# Patient Record
Sex: Female | Born: 1965 | Race: White | Hispanic: No | Marital: Married | Smoking: Never smoker
Health system: Southern US, Community
[De-identification: ages and names within clinical notes are randomized; demographics above are authoritative.]

## PROBLEM LIST (undated history)

## (undated) DIAGNOSIS — D649 Anemia, unspecified: Secondary | ICD-10-CM

## (undated) DIAGNOSIS — R112 Nausea with vomiting, unspecified: Secondary | ICD-10-CM

## (undated) DIAGNOSIS — K219 Gastro-esophageal reflux disease without esophagitis: Secondary | ICD-10-CM

## (undated) DIAGNOSIS — G510 Bell's palsy: Secondary | ICD-10-CM

## (undated) DIAGNOSIS — F419 Anxiety disorder, unspecified: Secondary | ICD-10-CM

## (undated) DIAGNOSIS — C50919 Malignant neoplasm of unspecified site of unspecified female breast: Secondary | ICD-10-CM

## (undated) DIAGNOSIS — Z9889 Other specified postprocedural states: Secondary | ICD-10-CM

## (undated) DIAGNOSIS — M797 Fibromyalgia: Secondary | ICD-10-CM

## (undated) DIAGNOSIS — F32A Depression, unspecified: Secondary | ICD-10-CM

## (undated) DIAGNOSIS — J45909 Unspecified asthma, uncomplicated: Secondary | ICD-10-CM

## (undated) DIAGNOSIS — I10 Essential (primary) hypertension: Secondary | ICD-10-CM

## (undated) HISTORY — DX: Unspecified asthma, uncomplicated: J45.909

## (undated) HISTORY — PX: ABDOMINAL HYSTERECTOMY: SHX81

## (undated) HISTORY — PX: NASAL SEPTUM SURGERY: SHX37

## (undated) HISTORY — DX: Malignant neoplasm of unspecified site of unspecified female breast: C50.919

## (undated) HISTORY — PX: WISDOM TOOTH EXTRACTION: SHX21

## (undated) HISTORY — PX: TUBAL LIGATION: SHX77

---

## 2001-04-01 ENCOUNTER — Ambulatory Visit (HOSPITAL_COMMUNITY): Admission: RE | Admit: 2001-04-01 | Discharge: 2001-04-01 | Payer: Self-pay | Admitting: Neurology

## 2001-06-11 ENCOUNTER — Ambulatory Visit (HOSPITAL_COMMUNITY): Admission: RE | Admit: 2001-06-11 | Discharge: 2001-06-11 | Payer: Self-pay | Admitting: Family Medicine

## 2001-06-11 ENCOUNTER — Encounter: Payer: Self-pay | Admitting: Family Medicine

## 2001-06-25 ENCOUNTER — Emergency Department (HOSPITAL_COMMUNITY): Admission: EM | Admit: 2001-06-25 | Discharge: 2001-06-25 | Payer: Self-pay | Admitting: Emergency Medicine

## 2001-12-07 ENCOUNTER — Encounter: Payer: Self-pay | Admitting: Family Medicine

## 2001-12-07 ENCOUNTER — Ambulatory Visit (HOSPITAL_COMMUNITY): Admission: RE | Admit: 2001-12-07 | Discharge: 2001-12-07 | Payer: Self-pay | Admitting: Family Medicine

## 2001-12-12 ENCOUNTER — Emergency Department (HOSPITAL_COMMUNITY): Admission: EM | Admit: 2001-12-12 | Discharge: 2001-12-12 | Payer: Self-pay | Admitting: *Deleted

## 2002-01-04 ENCOUNTER — Encounter: Payer: Self-pay | Admitting: Family Medicine

## 2002-01-04 ENCOUNTER — Ambulatory Visit (HOSPITAL_COMMUNITY): Admission: RE | Admit: 2002-01-04 | Discharge: 2002-01-04 | Payer: Self-pay | Admitting: Family Medicine

## 2002-01-19 ENCOUNTER — Ambulatory Visit (HOSPITAL_COMMUNITY): Admission: RE | Admit: 2002-01-19 | Discharge: 2002-01-19 | Payer: Self-pay | Admitting: Family Medicine

## 2002-01-19 ENCOUNTER — Encounter: Payer: Self-pay | Admitting: Family Medicine

## 2003-05-19 ENCOUNTER — Other Ambulatory Visit: Admission: RE | Admit: 2003-05-19 | Discharge: 2003-05-19 | Payer: Self-pay | Admitting: Obstetrics & Gynecology

## 2003-05-24 ENCOUNTER — Emergency Department (HOSPITAL_COMMUNITY): Admission: EM | Admit: 2003-05-24 | Discharge: 2003-05-24 | Payer: Self-pay | Admitting: Emergency Medicine

## 2003-11-17 ENCOUNTER — Ambulatory Visit (HOSPITAL_COMMUNITY): Admission: RE | Admit: 2003-11-17 | Discharge: 2003-11-17 | Payer: Self-pay | Admitting: Family Medicine

## 2007-03-09 ENCOUNTER — Ambulatory Visit (HOSPITAL_COMMUNITY): Admission: RE | Admit: 2007-03-09 | Discharge: 2007-03-09 | Payer: Self-pay | Admitting: Family Medicine

## 2007-03-15 ENCOUNTER — Ambulatory Visit: Payer: Self-pay | Admitting: *Deleted

## 2007-03-17 ENCOUNTER — Ambulatory Visit (HOSPITAL_COMMUNITY): Admission: RE | Admit: 2007-03-17 | Discharge: 2007-03-17 | Payer: Self-pay | Admitting: *Deleted

## 2007-03-17 ENCOUNTER — Ambulatory Visit: Payer: Self-pay | Admitting: Cardiology

## 2011-11-06 ENCOUNTER — Encounter: Payer: Self-pay | Admitting: *Deleted

## 2011-11-06 ENCOUNTER — Emergency Department (HOSPITAL_COMMUNITY): Payer: BC Managed Care – PPO

## 2011-11-06 ENCOUNTER — Emergency Department (HOSPITAL_COMMUNITY)
Admission: EM | Admit: 2011-11-06 | Discharge: 2011-11-06 | Disposition: A | Discharge status: Home / Self Care | Payer: BC Managed Care – PPO | Attending: Emergency Medicine | Admitting: Emergency Medicine

## 2011-11-06 DIAGNOSIS — M7989 Other specified soft tissue disorders: Secondary | ICD-10-CM | POA: Insufficient documentation

## 2011-11-06 DIAGNOSIS — R079 Chest pain, unspecified: Secondary | ICD-10-CM | POA: Insufficient documentation

## 2011-11-06 DIAGNOSIS — R0781 Pleurodynia: Secondary | ICD-10-CM

## 2011-11-06 DIAGNOSIS — R109 Unspecified abdominal pain: Secondary | ICD-10-CM | POA: Insufficient documentation

## 2011-11-06 LAB — URINALYSIS, ROUTINE W REFLEX MICROSCOPIC
Bilirubin Urine: NEGATIVE
Glucose, UA: NEGATIVE mg/dL
Hgb urine dipstick: NEGATIVE
Ketones, ur: NEGATIVE mg/dL
Leukocytes, UA: NEGATIVE
Nitrite: NEGATIVE
Protein, ur: NEGATIVE mg/dL
Specific Gravity, Urine: 1.025 (ref 1.005–1.030)
Urobilinogen, UA: 0.2 mg/dL (ref 0.0–1.0)
pH: 6 (ref 5.0–8.0)

## 2011-11-06 MED ORDER — OXYCODONE-ACETAMINOPHEN 5-325 MG PO TABS: 2.0000 | ORAL_TABLET | Freq: Once | ORAL | Status: AC

## 2011-11-06 MED ORDER — OXYCODONE-ACETAMINOPHEN 5-325 MG PO TABS
2.0000 | ORAL_TABLET | Freq: Once | ORAL | Status: AC
  Administered 2011-11-06: 2 via ORAL
  Filled 2011-11-06: qty 2

## 2011-11-06 MED ORDER — IBUPROFEN 600 MG PO TABS: 600.0000 mg | ORAL_TABLET | Freq: Four times a day (QID) | ORAL | Status: AC | PRN

## 2011-11-06 NOTE — ED Provider Notes (Signed)
History     CSN: [id number] Arrival date & time: 11/06/2011  1:16 AM   First MD Initiated Contact with Patient 11/06/11 0157      Chief Complaint  Patient presents with  . Flank Pain    (Consider location/radiation/quality/duration/timing/severity/associated sxs/prior treatment) HPI Comments: 45 year old female with a history of fibromyalgia presents with a complaint of acute onset of bilateral rib pain which radiates from the anterior ribs below the breasts to the mid back. This is worse with deep breathing, worse with coughing, sharp and stabbing in nature and constant. She denies any fevers chills nausea or vomiting. She has had similar symptoms symptoms to this in the past though they were much more mild at that time. She denies any change in her dietary habits, exercise habits or medication regimen. She did take 600 mg of Advil prior to arrival with no relief. She denies injury  The history is provided by the patient and a relative.    History reviewed. No pertinent past medical history.  Past Surgical History  Procedure Date  . Tubal ligation   . Nasal septum surgery     No family history on file.  History  Substance Use Topics  . Smoking status: Never Smoker   . Smokeless tobacco: Not on file  . Alcohol Use: No    OB History    Grav Para Term Preterm Abortions TAB SAB Ect Mult Living                  Review of Systems  All other systems reviewed and are negative.    Allergies  Sulfa antibiotics  Home Medications   Current Outpatient Rx  Name Route Sig Dispense Refill  . IBUPROFEN 600 MG PO TABS Oral Take 1 tablet (600 mg total) by mouth every 6 (six) hours as needed for pain. 30 tablet 0  . OXYCODONE-ACETAMINOPHEN 5-325 MG PO TABS Oral Take 2 tablets by mouth once. 10 tablet 0    BP 151/97  Pulse 73  Temp 98.6 F (37 C)  Resp 20  Ht 5' (1.524 m)  Wt 224 lb (101.606 kg)  BMI 43.75 kg/m2  SpO2 99%  LMP 10/06/2011  Physical Exam  Nursing note  and vitals reviewed. Constitutional: She appears well-developed and well-nourished.       Uncomfortable appearing  HENT:  Head: Normocephalic and atraumatic.  Mouth/Throat: Oropharynx is clear and moist. No oropharyngeal exudate.  Eyes: Conjunctivae and EOM are normal. Pupils are equal, round, and reactive to light. Right eye exhibits no discharge. Left eye exhibits no discharge. No scleral icterus.  Neck: Normal range of motion. Neck supple. No JVD present. No thyromegaly present.  Cardiovascular: Normal rate, regular rhythm, normal heart sounds and intact distal pulses.  Exam reveals no gallop and no friction rub.   No murmur heard. Pulmonary/Chest: Effort normal and breath sounds normal. No respiratory distress. She has no wheezes. She has no rales. She exhibits tenderness ( Chaperone present for exam, patient has bilateral rib margin tenderness over the lower 2 ribs anteriorly, laterally, mildly posteriorly.).  Abdominal: Soft. Bowel sounds are normal. She exhibits no distension and no mass. There is no tenderness.  Musculoskeletal: Normal range of motion. She exhibits no edema and no tenderness.  Lymphadenopathy:    She has no cervical adenopathy.  Neurological: She is alert. Coordination normal.  Skin: Skin is warm and dry. No rash noted. No erythema.  Psychiatric: She has a normal mood and affect. Her behavior is normal.  ED Course  Procedures (including critical care time)   Labs Reviewed  URINALYSIS, ROUTINE W REFLEX MICROSCOPIC   Dg Chest 2 View  11/06/2011  *RADIOLOGY REPORT*  Clinical Data: Status post fall; bilateral anterior upper rib pain.  CHEST - 2 VIEW  Comparison: Chest radiograph performed 03/09/2007  Findings: The lungs are well-aerated and clear.  There is no evidence of focal opacification, pleural effusion or pneumothorax.  The heart is normal in size; the mediastinal contour is within normal limits.  No acute osseous abnormalities are seen.  IMPRESSION: No  acute cardiopulmonary process seen.  No displaced rib fractures identified.  Original Report Authenticated By: Tonia Ghent, M.D.     1. Rib pain       MDM  No tenderness in the abdominal wall, but has tenderness over the bilateral chest wall. No signs of rash deformity or trauma. Oxygen saturation 99% an underlying lung sounds are normal. X-ray pending, pain medications given  Pain medication with significant improvement. Again vital signs appear normal and patient is amenable to discharge. Rest x-ray reviewed and findings communicated to patient     Vida Roller, MD 11/06/11 [id number]

## 2011-11-06 NOTE — ED Notes (Signed)
Pt report bilateral flank pain starting approx 8 hs ago

## 2012-03-18 ENCOUNTER — Other Ambulatory Visit: Payer: Self-pay

## 2012-03-18 DIAGNOSIS — J45909 Unspecified asthma, uncomplicated: Secondary | ICD-10-CM

## 2012-03-24 ENCOUNTER — Ambulatory Visit (HOSPITAL_COMMUNITY): Payer: BC Managed Care – PPO

## 2012-03-29 ENCOUNTER — Encounter (HOSPITAL_COMMUNITY): Payer: BC Managed Care – PPO

## 2013-04-21 ENCOUNTER — Ambulatory Visit (INDEPENDENT_AMBULATORY_CARE_PROVIDER_SITE_OTHER): Payer: BC Managed Care – PPO | Admitting: Family Medicine

## 2013-04-21 ENCOUNTER — Encounter: Payer: Self-pay | Admitting: Family Medicine

## 2013-04-21 VITALS — BP 120/82 | Temp 99.0°F | Ht 63.0 in | Wt 252.0 lb

## 2013-04-21 DIAGNOSIS — J45901 Unspecified asthma with (acute) exacerbation: Secondary | ICD-10-CM

## 2013-04-21 MED ORDER — ALBUTEROL SULFATE HFA 108 (90 BASE) MCG/ACT IN AERS: 2.0000 | INHALATION_SPRAY | Freq: Four times a day (QID) | RESPIRATORY_TRACT | Status: DC | PRN

## 2013-04-21 MED ORDER — FLUTICASONE PROPIONATE HFA 220 MCG/ACT IN AERO: 2.0000 | INHALATION_SPRAY | Freq: Two times a day (BID) | RESPIRATORY_TRACT | Status: DC

## 2013-04-21 MED ORDER — PREDNISONE 20 MG PO TABS: ORAL_TABLET | ORAL | Status: DC

## 2013-04-21 NOTE — Progress Notes (Signed)
  Subjective:    Patient ID: Tammy Calderon, female    DOB: October 14, 1966, 47 y.o.   MRN: [id number]  Wheezing  This is a recurrent problem. The current episode started more than 1 month ago. The problem occurs constantly. The problem has been unchanged. Associated symptoms include shortness of breath. Nothing aggravates the symptoms. Treatments tried: albuterol inhaler. The treatment provided moderate relief. Her past medical history is significant for asthma.  Was treated about a year ago with inhalers it got better but now with reoccurance   No fam history of asthma/ not around smoke Review of Systems  Respiratory: Positive for shortness of breath and wheezing.   worse past few days. Worse at night.     Objective:   Physical Exam Eardrums normal throat normal neck no masses lungs bilateral expiratory wheezes not respiratory distress. hearts regular skin warm dry neurologic grossly normal Extremities no edema.      Assessment & Plan:  Asthma-long t maintenance medicine I like to see her back again in2-3 months to see how things are going. Possibly by late summer we can taper down on the Flovent.ime was spent talking to the patient educating her about asthma. Also educating about Ventolin 2 puffs every 4 hours when necessary as a rescue inhaler and Flovent 2 puffs twice a day ongoing as a. Also prednisone taper. 25 minutes spent with patient

## 2013-04-21 NOTE — Patient Instructions (Addendum)
It is important to use your albuterol inhaler 2 puffs every 4 hours as needed for wheezing. This is your rescue medicine. If you're having a difficult time he can use as often as every couple hours. Certainly if you feel you're breathing is getting worse she needs to let us know.  Flovent is a maintenance medicine. 2 puffs twice a day reduces the inflammation in your lungs which hopefully over the course of the next 2 weeks we will reduce how much you have to use the albuterol. The goal is to see a significant improvement in your breathing over the course of the next several weeks.  It is okay to use Claritin or the generic form of it as needed for allergies along with this medication. He will also be on prednisone over the course of the next 6 days 3 tablets a day for the first 3 days then 2 tablets daily for 3 days. If you feel that things are getting worse rather than better please call us. I would like to see back in approximately 2 months to recheck how this is doing at that point we may be able to reduce the Flovent. Hopefully as the summer progresses he will be able to taper off of your medication. At this present moment I would not recommend lung function testing. I also don't recommend seeing asthma specialist at this present moment. If your situation changes please let us know   .Asthma Attack Prevention HOW CAN ASTHMA BE PREVENTED? Currently, there is no way to prevent asthma from starting. However, you can take steps to control the disease and prevent its symptoms after you have been diagnosed. Learn about your asthma and how to control it. Take an active role to control your asthma by working with your caregiver to create and follow an asthma action plan. An asthma action plan guides you in taking your medicines properly, avoiding factors that make your asthma worse, tracking your level of asthma control, responding to worsening asthma, and seeking emergency care when needed. To track your  asthma, keep records of your symptoms, check your peak flow number using a peak flow meter (handheld device that shows how well air moves out of your lungs), and get regular asthma checkups.  Other ways to prevent asthma attacks include:  Use medicines as your caregiver directs.  Identify and avoid things that make your asthma worse (as much as you can).  Keep track of your asthma symptoms and level of control.  Get regular checkups for your asthma.  With your caregiver, write a detailed plan for taking medicines and managing an asthma attack. Then be sure to follow your action plan. Asthma is an ongoing condition that needs regular monitoring and treatment.  Identify and avoid asthma triggers. A number of outdoor allergens and irritants (pollen, mold, cold air, air pollution) can trigger asthma attacks. Find out what causes or makes your asthma worse, and take steps to avoid those triggers (see below).  Monitor your breathing. Learn to recognize warning signs of an attack, such as slight coughing, wheezing or shortness of breath. However, your lung function may already decrease before you notice any signs or symptoms, so regularly measure and record your peak airflow with a home peak flow meter.  Identify and treat attacks early. If you act quickly, you're less likely to have a severe attack. You will also need less medicine to control your symptoms. When your peak flow measurements decrease and alert you to an upcoming attack, take  your medicine as instructed, and immediately stop any activity that may have triggered the attack. If your symptoms do not improve, get medical help.  Pay attention to increasing quick-relief inhaler use. If you find yourself relying on your quick-relief inhaler (such as albuterol), your asthma is not under control. See your caregiver about adjusting your treatment. IDENTIFY AND CONTROL FACTORS THAT MAKE YOUR ASTHMA WORSE A number of common things can set off or  make your asthma symptoms worse (asthma triggers). Keep track of your asthma symptoms for several weeks, detailing all the environmental and emotional factors that are linked with your asthma. When you have an asthma attack, go back to your asthma diary to see which factor, or combination of factors, might have contributed to it. Once you know what these factors are, you can take steps to control many of them.  Allergies: If you have allergies and asthma, it is important to take asthma prevention steps at home. Asthma attacks (worsening of asthma symptoms) can be triggered by allergies, which can cause temporary increased inflammation of your airways. Minimizing contact with the substance to which you are allergic will help prevent an asthma attack. Animal Dander:   Some people are allergic to the flakes of skin or dried saliva from animals with fur or feathers. Keep these pets out of your home.  If you can't keep a pet outdoors, keep the pet out of your bedroom and other sleeping areas at all times, and keep the door closed.  Remove carpets and furniture covered with cloth from your home. If that is not possible, keep the pet away from fabric-covered furniture and carpets. Dust Mites:  Many people with asthma are allergic to dust mites. Dust mites are tiny bugs that are found in every home, in mattresses, pillows, carpets, fabric-covered furniture, bedcovers, clothes, stuffed toys, fabric, and other fabric-covered items.  Cover your mattress in a special dust-proof cover.  Cover your pillow in a special dust-proof cover, or wash the pillow each week in hot water. Water must be hotter than 130 F to kill dust mites. Cold or warm water used with detergent and bleach can also be effective.  Wash the sheets and blankets on your bed each week in hot water.  Try not to sleep or lie on cloth-covered cushions.  Call ahead when traveling and ask for a smoke-free hotel room. Bring your own bedding and  pillows, in case the hotel only supplies feather pillows and down comforters, which may contain dust mites and cause asthma symptoms.  Remove carpets from your bedroom and those laid on concrete, if you can.  Keep stuffed toys out of the bed, or wash the toys weekly in hot water or cooler water with detergent and bleach. Cockroaches:  Many people with asthma are allergic to the droppings and remains of cockroaches.  Keep food and garbage in closed containers. Never leave food out.  Use poison baits, traps, powders, gels, or paste (for example, boric acid).  If a spray is used to kill cockroaches, stay out of the room until the odor goes away. Indoor Mold:  Fix leaky faucets, pipes, or other sources of water that have mold around them.  Clean moldy surfaces with a cleaner that has bleach in it. Pollen and Outdoor Mold:  When pollen or mold spore counts are high, try to keep your windows closed.  Stay indoors with windows closed from late morning to afternoon, if you can. Pollen and some mold spore counts are highest at  that time.  Ask your caregiver whether you need to take or increase anti-inflammatory medicine before your allergy season starts. Irritants:   Tobacco smoke is an irritant. If you smoke, ask your caregiver how you can quit. Ask family members to quit smoking, too. Do not allow smoking in your home or car.  If possible, do not use a wood-burning stove, kerosene heater, or fireplace. Minimize exposure to all sources of smoke, including incense, candles, fires, and fireworks.  Try to stay away from strong odors and sprays, such as perfume, talcum powder, hair spray, and paints.  Decrease humidity in your home and use an indoor air cleaning device. Reduce indoor humidity to below 60 percent. Dehumidifiers or central air conditioners can do this.  Try to have someone else vacuum for you once or twice a week, if you can. Stay out of rooms while they are being vacuumed and  for a short while afterward.  If you vacuum, use a dust mask from a hardware store, a double-layered or microfilter vacuum cleaner bag, or a vacuum cleaner with a HEPA filter.  Sulfites in foods and beverages can be irritants. Do not drink beer or wine, or eat dried fruit, processed potatoes, or shrimp if they cause asthma symptoms.  Cold air can trigger an asthma attack. Cover your nose and mouth with a scarf on cold or windy days.  Several health conditions can make asthma more difficult to manage, including runny nose, sinus infections, reflux disease, psychological stress, and sleep apnea. Your caregiver will treat these conditions, as well.  Avoid close contact with people who have a cold or the flu, since your asthma symptoms may get worse if you catch the infection from them. Wash your hands thoroughly after touching items that may have been handled by people with a respiratory infection.  Get a flu shot every year to protect against the flu virus, which often makes asthma worse for days or weeks. Also get a pneumonia shot once every five to 10 years. Drugs:  Aspirin and other painkillers can cause asthma attacks. 10% to 20% of people with asthma have sensitivity to aspirin or a group of painkillers called non-steroidal anti-inflammatory drugs (NSAIDS), such as ibuprofen and naproxen. These drugs are used to treat pain and reduce fevers. Asthma attacks caused by any of these medicines can be severe and even fatal. These drugs must be avoided in people who have known aspirin sensitive asthma. Products with acetaminophen are considered safe for people who have asthma. It is important that people with aspirin sensitivity read labels of all over-the-counter drugs used to treat pain, colds, coughs, and fever.  Beta blockers and ACE inhibitors are other drugs which you should discuss with your caregiver, in relation to your asthma. ALLERGY SKIN TESTING  Ask your asthma caregiver about allergy  skin testing or blood testing (RAST test) to identify the allergens to which you are sensitive. If you are found to have allergies, allergy shots (immunotherapy) for asthma may help prevent future allergies and asthma. With allergy shots, small doses of allergens (substances to which you are allergic) are injected under your skin on a regular schedule. Over a period of time, your body may become used to the allergen and less responsive with asthma symptoms. You can also take measures to minimize your exposure to those allergens. EXERCISE  If you have exercise-induced asthma, or are planning vigorous exercise, or exercise in cold, humid, or dry environments, prevent exercise-induced asthma by following your caregiver's advice regarding  asthma treatment before exercising. Document Released: 11/26/2009 Document Revised: 03/01/2012 Document Reviewed: 11/26/2009 [facility] Patient Information 2013 [place], [place].

## 2013-04-22 ENCOUNTER — Other Ambulatory Visit: Payer: Self-pay | Admitting: Family Medicine

## 2013-04-24 NOTE — Telephone Encounter (Signed)
Ok times 2 total 

## 2013-05-18 ENCOUNTER — Ambulatory Visit (INDEPENDENT_AMBULATORY_CARE_PROVIDER_SITE_OTHER): Payer: BC Managed Care – PPO | Admitting: Nurse Practitioner

## 2013-05-18 ENCOUNTER — Encounter: Payer: Self-pay | Admitting: Family Medicine

## 2013-05-18 ENCOUNTER — Encounter: Payer: Self-pay | Admitting: Nurse Practitioner

## 2013-05-18 VITALS — BP 142/90 | Temp 98.2°F | Wt 253.4 lb

## 2013-05-18 DIAGNOSIS — E559 Vitamin D deficiency, unspecified: Secondary | ICD-10-CM

## 2013-05-18 DIAGNOSIS — M545 Low back pain, unspecified: Secondary | ICD-10-CM

## 2013-05-18 DIAGNOSIS — IMO0001 Reserved for inherently not codable concepts without codable children: Secondary | ICD-10-CM

## 2013-05-18 DIAGNOSIS — R5383 Other fatigue: Secondary | ICD-10-CM

## 2013-05-18 DIAGNOSIS — G629 Polyneuropathy, unspecified: Secondary | ICD-10-CM

## 2013-05-18 DIAGNOSIS — R5381 Other malaise: Secondary | ICD-10-CM

## 2013-05-18 DIAGNOSIS — M797 Fibromyalgia: Secondary | ICD-10-CM

## 2013-05-18 DIAGNOSIS — G589 Mononeuropathy, unspecified: Secondary | ICD-10-CM

## 2013-05-18 LAB — POCT URINALYSIS DIPSTICK
Blood, UA: 50
Spec Grav, UA: 1.025
pH, UA: 5

## 2013-05-18 LAB — POCT UA - MICROSCOPIC ONLY
Bacteria, U Microscopic: 0
Mucus, UA: 0
RBC, urine, microscopic: 0

## 2013-05-18 MED ORDER — PREGABALIN 50 MG PO CAPS: 50.0000 mg | ORAL_CAPSULE | Freq: Three times a day (TID) | ORAL | Status: DC

## 2013-05-19 ENCOUNTER — Encounter: Payer: Self-pay | Admitting: Nurse Practitioner

## 2013-05-19 DIAGNOSIS — M797 Fibromyalgia: Secondary | ICD-10-CM | POA: Insufficient documentation

## 2013-05-19 DIAGNOSIS — G629 Polyneuropathy, unspecified: Secondary | ICD-10-CM | POA: Insufficient documentation

## 2013-05-19 DIAGNOSIS — E559 Vitamin D deficiency, unspecified: Secondary | ICD-10-CM | POA: Insufficient documentation

## 2013-05-19 NOTE — Assessment & Plan Note (Signed)
Assessment:Fibromyalgia - Plan: Magnesium, Magnesium, Ambulatory referral to Rheumatology  Low back pain - Plan: POCT urinalysis dipstick, POCT UA - Microscopic Only  Fatigue - Plan: Basic metabolic panel, Vitamin D 25 hydroxy, Vitamin B12, TSH, Magnesium, Basic metabolic panel, Vitamin D 25 hydroxy, Vitamin B12, TSH, Magnesium, Ambulatory referral to Rheumatology  Unspecified vitamin D deficiency - Plan: Vitamin D 25 hydroxy, Vitamin D 25 hydroxy  Neuropathy - Plan: Vitamin B12, Magnesium, Vitamin B12, Magnesium, Ambulatory referral to Rheumatology  Plan: Meds ordered this encounter  Medications  . pregabalin (LYRICA) 50 MG capsule    Sig: Take 1 capsule (50 mg total) by mouth 3 (three) times daily.    Dispense:  90 capsule    Refill:  0    Order Specific Question:  Supervising Provider    Answer:  LUKING, WILLIAM S [[id number]   Start with 50 mg once a day for several days, if tolerated increase to twice a day. If needed may increase up to 3 times a day. Continue anti-inflammatories and muscle relaxant. Discussed importance of stress reduction. Will refer to specialist for further evaluation, call back sooner if symptoms worsen.  

## 2013-05-19 NOTE — Assessment & Plan Note (Signed)
Assessment:Fibromyalgia - Plan: Magnesium, Magnesium, Ambulatory referral to Rheumatology  Low back pain - Plan: POCT urinalysis dipstick, POCT UA - Microscopic Only  Fatigue - Plan: Basic metabolic panel, Vitamin D 25 hydroxy, Vitamin B12, TSH, Magnesium, Basic metabolic panel, Vitamin D 25 hydroxy, Vitamin B12, TSH, Magnesium, Ambulatory referral to Rheumatology  Unspecified vitamin D deficiency - Plan: Vitamin D 25 hydroxy, Vitamin D 25 hydroxy  Neuropathy - Plan: Vitamin B12, Magnesium, Vitamin B12, Magnesium, Ambulatory referral to Rheumatology  Plan: Meds ordered this encounter  Medications  . pregabalin (LYRICA) 50 MG capsule    Sig: Take 1 capsule (50 mg total) by mouth 3 (three) times daily.    Dispense:  90 capsule    Refill:  0    Order Specific Question:  Supervising Provider    Answer:  Riccardo Dubin   Start with 50 mg once a day for several days, if tolerated increase to twice a day. If needed may increase up to 3 times a day. Continue anti-inflammatories and muscle relaxant. Discussed importance of stress reduction. Will refer to specialist for further evaluation, call back sooner if symptoms worsen.

## 2013-05-19 NOTE — Assessment & Plan Note (Signed)
Vitamin D level pending

## 2013-05-19 NOTE — Progress Notes (Signed)
Subjective:  Presents for complaints of a flareup of her muscle aches. Patient has been seen off-and-on for fibromyalgia for several years. Lifted some boxes several days ago which seemed to flare up her symptoms. Severe on some days. Worse with prolonged sitting or walking. Pain with excess activity including daily things like cleaning her house. No fevers. Some joint pain. Low back pain in particular. Occasional numbness in in the lower extremities bilateral. Was recently on prednisone for another condition, her fibromyalgia symptoms improved. Could not take Neurontin due to sleepiness. Also no relief with Cymbalta. Extreme fatigue at times. No unusual stress. No urinary frequency urgency or dysuria. Patient requesting a urine check due to some of her back pain although no specific CVA/flank tenderness.  Objective:   BP 142/90  Temp(Src) 98.2 F (36.8 C) (Oral)  Wt 253 lb 6.4 oz (114.941 kg)  BMI 44.9 kg/m2 NAD. Alert, oriented. Mildly anxious affect. Lungs clear. Heart regular rate rhythm. Tender muscles noted with palpation upper and lower body bilateral. Generalized tenderness noted in the lumbar area. SLR negative bilaterally. Reflexes normal limit lower extremities. Sensation grossly intact. Urine microscopic negative.  Assessment:Fibromyalgia - Plan: Magnesium, Magnesium, Ambulatory referral to Rheumatology  Low back pain - Plan: POCT urinalysis dipstick, POCT UA - Microscopic Only  Fatigue - Plan: Basic metabolic panel, Vitamin D 25 hydroxy, Vitamin B12, TSH, Magnesium, Basic metabolic panel, Vitamin D 25 hydroxy, Vitamin B12, TSH, Magnesium, Ambulatory referral to Rheumatology  Unspecified vitamin D deficiency - Plan: Vitamin D 25 hydroxy, Vitamin D 25 hydroxy  Neuropathy - Plan: Vitamin B12, Magnesium, Vitamin B12, Magnesium, Ambulatory referral to Rheumatology  Plan: Meds ordered this encounter  Medications  . pregabalin (LYRICA) 50 MG capsule    Sig: Take 1 capsule (50 mg total)  by mouth 3 (three) times daily.    Dispense:  90 capsule    Refill:  0    Order Specific Question:  Supervising Provider    Answer:  Riccardo Dubin   Start with 50 mg once a day for several days, if tolerated increase to twice a day. If needed may increase up to 3 times a day. Continue anti-inflammatories and muscle relaxant. Discussed importance of stress reduction. Will refer to specialist for further evaluation, call back sooner if symptoms worsen.

## 2013-08-12 ENCOUNTER — Telehealth: Payer: Self-pay | Admitting: Nurse Practitioner

## 2013-08-12 NOTE — Telephone Encounter (Signed)
Patient is calling to let us know that the Rheumatologist we originally set her up for in [place] is too far of a drive and she would like to go someone in [place] instead.

## 2013-08-15 NOTE — Telephone Encounter (Signed)
Brendale, Help! I think patient requested this.  Wants to go to [place].  Thanks.

## 2013-08-15 NOTE — Telephone Encounter (Signed)
Brendale, please assist.  I think she requested this.  Thanks.

## 2013-08-16 NOTE — Telephone Encounter (Signed)
LMOM to notify pt that we referred to [facility] due to the 3 specialists in [place] do not treat the condition that she's being referred for. This was all noted on the referral in her Epic chart.  Gave her Dr. Jacky Kindle phone number so that if she wants to cancel her appointment she can call them and also explained to pt that she could call me if she has any further questions.

## 2013-08-19 ENCOUNTER — Ambulatory Visit (INDEPENDENT_AMBULATORY_CARE_PROVIDER_SITE_OTHER): Payer: BC Managed Care – PPO | Admitting: Nurse Practitioner

## 2013-08-19 ENCOUNTER — Encounter: Payer: Self-pay | Admitting: Nurse Practitioner

## 2013-08-19 VITALS — BP 122/84 | Ht 62.0 in | Wt 254.6 lb

## 2013-08-19 DIAGNOSIS — IMO0001 Reserved for inherently not codable concepts without codable children: Secondary | ICD-10-CM

## 2013-08-19 DIAGNOSIS — M797 Fibromyalgia: Secondary | ICD-10-CM

## 2013-08-19 DIAGNOSIS — N925 Other specified irregular menstruation: Secondary | ICD-10-CM

## 2013-08-19 DIAGNOSIS — N938 Other specified abnormal uterine and vaginal bleeding: Secondary | ICD-10-CM

## 2013-08-19 DIAGNOSIS — N949 Unspecified condition associated with female genital organs and menstrual cycle: Secondary | ICD-10-CM

## 2013-08-19 DIAGNOSIS — N951 Menopausal and female climacteric states: Secondary | ICD-10-CM

## 2013-08-19 DIAGNOSIS — M25559 Pain in unspecified hip: Secondary | ICD-10-CM

## 2013-08-19 LAB — POCT URINE PREGNANCY: Preg Test, Ur: NEGATIVE

## 2013-08-19 MED ORDER — PHENTERMINE HCL 37.5 MG PO TABS: 37.5000 mg | ORAL_TABLET | Freq: Every day | ORAL | Status: DC

## 2013-08-22 ENCOUNTER — Encounter: Payer: Self-pay | Admitting: Nurse Practitioner

## 2013-08-22 DIAGNOSIS — N951 Menopausal and female climacteric states: Secondary | ICD-10-CM | POA: Insufficient documentation

## 2013-08-22 NOTE — Assessment & Plan Note (Signed)
Restart Lyrica 50 mg twice a day.

## 2013-08-22 NOTE — Assessment & Plan Note (Signed)
Start phentermine 37.5 mg every morning. Recheck in one month.

## 2013-08-22 NOTE — Progress Notes (Signed)
Subjective:  Presents for complaints of irregular cycles since April. Was having regular cycles up until that time. Did not have a cycle from April to the end of July, at that time had a normal cycle. 2 weeks later patient had a very heavy cycle that was painful with clots. Left ovarian area pain. This ended about a week ago. PMH her biological mother had uterine cancer. Brown to bright red discharge at times, otherwise no odor or color. No urinary symptoms. No fever. Married, same sexual partner. Has had a tubal ligation. Would like to start medication to help her with weight loss. Has been off her Lyrica, wants to restart it at a lower dose, had "foggy thinking" which she was on 50 mg 3 times a day. This did help her fibromyalgia symptoms.  Objective:   BP 122/84  Ht 5\' 2"  (1.575 m)  Wt 254 lb 9.6 oz (115.486 kg)  BMI 46.56 kg/m2 NAD. Alert, oriented. Lungs clear. Heart regular rate rhythm. Abdomen obese soft nondistended with minimal left pelvic area tenderness. External GU normal limit. Vagina no discharge noted. Tissue pink and moist. Bimanual exam minimal tenderness more towards the left lower quadrant, no obvious masses, limited exam due to abdominal girth. Urine hCG negative.  Assessment:Pain in joint, pelvic region and thigh, unspecified laterality - Plan: POCT urine pregnancy  DUB (dysfunctional uterine bleeding)  Perimenopause  Morbid obesity  Fibromyalgia  Plan: Meds ordered this encounter  Medications  . phentermine (ADIPEX-P) 37.5 MG tablet    Sig: Take 1 tablet (37.5 mg total) by mouth daily before breakfast.    Dispense:  30 tablet    Refill:  0    Order Specific Question:  Supervising Provider    Answer:  Merlyn Albert [[id number]]   Discussed importance of healthy diet and regular activity. Restart Lyrica that she has at home 50 mg twice a day. Discussed options regarding irregular bleeding. Patient elects to wait to see what her cycle will do. Explained that these are  normal changes associated with. Menopause. Patient to call if any frequent or heavy bleeding. Recheck in one month, call back sooner if any problems.

## 2013-08-22 NOTE — Assessment & Plan Note (Signed)
No intervention at this time. Patient elects to wait to see if her cycles will regulate on their own.

## 2013-09-08 ENCOUNTER — Other Ambulatory Visit: Payer: Self-pay | Admitting: Family Medicine

## 2013-09-08 NOTE — Telephone Encounter (Signed)
May give this a refill and one additional refill. Will need followup office visit when needing next.

## 2013-10-19 ENCOUNTER — Telehealth: Payer: Self-pay | Admitting: Family Medicine

## 2013-10-19 NOTE — Telephone Encounter (Signed)
Patient needs a prescription for HYDROcodone-acetaminophen (NORCO/VICODIN) 5-325 MG per tablet.    Please call if our office is able to accommodate this request

## 2013-10-20 ENCOUNTER — Other Ambulatory Visit: Payer: Self-pay | Admitting: Nurse Practitioner

## 2013-10-20 MED ORDER — HYDROCODONE-ACETAMINOPHEN 5-325 MG PO TABS: ORAL_TABLET | ORAL | Status: DC

## 2013-10-20 NOTE — Telephone Encounter (Signed)
Refill printed. Followup with Scott as planned.

## 2013-10-20 NOTE — Telephone Encounter (Signed)
Patient notified

## 2013-11-08 ENCOUNTER — Other Ambulatory Visit: Payer: Self-pay | Admitting: Nurse Practitioner

## 2013-11-09 NOTE — Telephone Encounter (Signed)
Our new office policy requires office visit before more refills. Thanks.

## 2014-01-03 ENCOUNTER — Telehealth: Payer: Self-pay | Admitting: Family Medicine

## 2014-01-03 NOTE — Telephone Encounter (Signed)
Done

## 2014-01-03 NOTE — Telephone Encounter (Signed)
Pt needs disability placard done please

## 2014-01-04 NOTE — Telephone Encounter (Signed)
Pt notified via voicemail LM

## 2014-01-13 ENCOUNTER — Ambulatory Visit (INDEPENDENT_AMBULATORY_CARE_PROVIDER_SITE_OTHER): Payer: BC Managed Care – PPO | Admitting: Family Medicine

## 2014-01-13 ENCOUNTER — Encounter: Payer: Self-pay | Admitting: Family Medicine

## 2014-01-13 VITALS — BP 108/76 | Temp 98.4°F | Ht 62.0 in | Wt 260.0 lb

## 2014-01-13 DIAGNOSIS — J019 Acute sinusitis, unspecified: Secondary | ICD-10-CM

## 2014-01-13 DIAGNOSIS — J45901 Unspecified asthma with (acute) exacerbation: Secondary | ICD-10-CM

## 2014-01-13 MED ORDER — PREDNISONE 20 MG PO TABS: ORAL_TABLET | ORAL | Status: AC

## 2014-01-13 MED ORDER — BECLOMETHASONE DIPROPIONATE 80 MCG/ACT IN AERS
2.0000 | INHALATION_SPRAY | Freq: Two times a day (BID) | RESPIRATORY_TRACT | Status: DC
Start: 2014-01-13 — End: 2021-09-10

## 2014-01-13 MED ORDER — AMOXICILLIN 500 MG PO TABS: 500.0000 mg | ORAL_TABLET | Freq: Three times a day (TID) | ORAL | Status: DC

## 2014-01-13 MED ORDER — HYDROCODONE-ACETAMINOPHEN 5-325 MG PO TABS: ORAL_TABLET | ORAL | Status: DC

## 2014-01-13 NOTE — Progress Notes (Signed)
   Subjective:    Patient ID: Tammy Calderon, female    DOB: April 01, 1966, 48 y.o.   MRN: [id number]  URI  This is a new problem. The current episode started in the past 7 days. The problem has been unchanged. There has been no fever. Associated symptoms include congestion, coughing, rhinorrhea and wheezing. Pertinent negatives include no chest pain or ear pain. Associated symptoms comments: Shortness of breath. She has tried decongestant for the symptoms. The treatment provided no relief.  present for 1 month, asthma acting up Sinus Sx past few days No fever, felt DOE Left ear pain PMH benign  Review of Systems  Constitutional: Negative for fever and activity change.  HENT: Positive for congestion and rhinorrhea. Negative for ear pain.   Eyes: Negative for discharge.  Respiratory: Positive for cough and wheezing. Negative for shortness of breath.   Cardiovascular: Negative for chest pain.       Objective:   Physical Exam  Nursing note and vitals reviewed. Constitutional: She appears well-developed.  HENT:  Head: Normocephalic.  Nose: Nose normal.  Mouth/Throat: Oropharynx is clear and moist. No oropharyngeal exudate.  Neck: Neck supple.  Cardiovascular: Normal rate and normal heart sounds.   No murmur heard. Pulmonary/Chest: Effort normal and breath sounds normal. She has no wheezes.  Lymphadenopathy:    She has no cervical adenopathy.  Skin: Skin is warm and dry.          Assessment & Plan:  #1 asthma-patient is not able to afford Flovent I told her to try Qvar. See she can afford that. Albuterol when necessary Asthma exacerbation-prednisone taper Probable sinus infection amoxicillin 3 times a day 10 days followup when necessary

## 2014-01-16 ENCOUNTER — Telehealth: Payer: Self-pay | Admitting: Family Medicine

## 2014-01-16 NOTE — Telephone Encounter (Signed)
May have one month. Long-term use not recommended. 37.5 mg 1 every morning Adipex . #30.

## 2014-01-16 NOTE — Telephone Encounter (Signed)
Patient needs Rx for phentermine if possible   [facility]

## 2014-01-17 ENCOUNTER — Other Ambulatory Visit: Payer: Self-pay | Admitting: *Deleted

## 2014-01-17 MED ORDER — PHENTERMINE HCL 37.5 MG PO TABS: 37.5000 mg | ORAL_TABLET | Freq: Every day | ORAL | Status: DC

## 2014-01-17 NOTE — Telephone Encounter (Signed)
Left message on voicemail notifying pt med sent to pharm for one month supply.

## 2014-02-21 ENCOUNTER — Telehealth: Payer: Self-pay | Admitting: Family Medicine

## 2014-02-21 NOTE — Telephone Encounter (Signed)
HYDROcodone-acetaminophen (NORCO/VICODIN) 5-325 MG per tablet   Pt needs refill please   Last filled 01/13/14 Last Seen 01/13/14

## 2014-02-22 MED ORDER — HYDROCODONE-ACETAMINOPHEN 5-325 MG PO TABS: ORAL_TABLET | ORAL | Status: DC

## 2014-02-22 NOTE — Telephone Encounter (Signed)
She may have an additional refill. She should use these sparingly and try to make the prescription last as long as possible. As a reminder with these type of medicines office visits are necessary every 90 days, if ongoing use.

## 2014-02-22 NOTE — Telephone Encounter (Signed)
Notified patient via VM stating we have the med ready for her to pick up and to use the med sparingly.

## 2014-03-31 ENCOUNTER — Telehealth: Payer: Self-pay | Admitting: Family Medicine

## 2014-03-31 MED ORDER — HYDROCODONE-ACETAMINOPHEN 5-325 MG PO TABS: ORAL_TABLET | ORAL | Status: DC

## 2014-03-31 NOTE — Telephone Encounter (Signed)
Patient is calling requesting a refill of her hydrocodone.

## 2014-03-31 NOTE — Telephone Encounter (Signed)
Refill times one. Needs OV before further due to sched II drug

## 2014-03-31 NOTE — Telephone Encounter (Signed)
Script ready for pickup. Patient was notified.  

## 2014-03-31 NOTE — Telephone Encounter (Signed)
Last seen 01/13/14

## 2014-04-13 ENCOUNTER — Ambulatory Visit (INDEPENDENT_AMBULATORY_CARE_PROVIDER_SITE_OTHER): Payer: BC Managed Care – PPO | Admitting: Nurse Practitioner

## 2014-04-13 ENCOUNTER — Encounter: Payer: Self-pay | Admitting: Nurse Practitioner

## 2014-04-13 VITALS — BP 124/84 | Temp 98.3°F | Ht 62.0 in | Wt 261.0 lb

## 2014-04-13 DIAGNOSIS — M546 Pain in thoracic spine: Secondary | ICD-10-CM

## 2014-04-13 DIAGNOSIS — R34 Anuria and oliguria: Secondary | ICD-10-CM

## 2014-04-13 DIAGNOSIS — R609 Edema, unspecified: Secondary | ICD-10-CM

## 2014-04-13 DIAGNOSIS — S29012A Strain of muscle and tendon of back wall of thorax, initial encounter: Secondary | ICD-10-CM

## 2014-04-13 LAB — POCT URINALYSIS DIPSTICK
Spec Grav, UA: >=1.03
pH, UA: 5

## 2014-04-13 LAB — CBC WITH DIFFERENTIAL/PLATELET
Basophils Absolute: 0.1 10*3/uL (ref 0.0–0.1)
Basophils Relative: 2 % — ABNORMAL HIGH (ref 0–1)
Eosinophils Absolute: 0.5 10*3/uL (ref 0.0–0.7)
Eosinophils Relative: 9 % — ABNORMAL HIGH (ref 0–5)
HCT: 35.2 % — ABNORMAL LOW (ref 36.0–46.0)
Hemoglobin: 12.1 g/dL (ref 12.0–15.0)
Lymphocytes Relative: 31 % (ref 12–46)
Lymphs Abs: 1.8 10*3/uL (ref 0.7–4.0)
MCH: 28.6 pg (ref 26.0–34.0)
MCHC: 34.4 g/dL (ref 30.0–36.0)
MCV: 83.2 fL (ref 78.0–100.0)
Monocytes Absolute: 0.5 10*3/uL (ref 0.1–1.0)
Monocytes Relative: 8 % (ref 3–12)
Neutro Abs: 3 10*3/uL (ref 1.7–7.7)
Neutrophils Relative %: 50 % (ref 43–77)
Platelets: 279 10*3/uL (ref 150–400)
RBC: 4.23 MIL/uL (ref 3.87–5.11)
RDW: 14.2 % (ref 11.5–15.5)
WBC: 5.9 10*3/uL (ref 4.0–10.5)

## 2014-04-13 LAB — POCT UA - MICROSCOPIC ONLY
Bacteria, U Microscopic: NEGATIVE
RBC, urine, microscopic: NEGATIVE

## 2014-04-13 MED ORDER — POTASSIUM CHLORIDE CRYS ER 10 MEQ PO TBCR: 10.0000 meq | EXTENDED_RELEASE_TABLET | Freq: Two times a day (BID) | ORAL | Status: DC

## 2014-04-13 MED ORDER — FUROSEMIDE 40 MG PO TABS: 40.0000 mg | ORAL_TABLET | Freq: Every day | ORAL | Status: DC

## 2014-04-13 NOTE — Patient Instructions (Signed)
Icy hot smart relief TENS unit Ice/heat applications Advil as directed Stretching exercises

## 2014-04-14 LAB — HEPATIC FUNCTION PANEL
ALT: 12 U/L (ref 0–35)
AST: 18 U/L (ref 0–37)
Albumin: 4 g/dL (ref 3.5–5.2)
Alkaline Phosphatase: 69 U/L (ref 39–117)
Bilirubin, Direct: 0.1 mg/dL (ref 0.0–0.3)
Indirect Bilirubin: 0.3 mg/dL (ref 0.2–1.2)
Total Bilirubin: 0.4 mg/dL (ref 0.2–1.2)
Total Protein: 7 g/dL (ref 6.0–8.3)

## 2014-04-14 LAB — BRAIN NATRIURETIC PEPTIDE: Brain Natriuretic Peptide: 48.2 pg/mL (ref 0.0–100.0)

## 2014-04-14 LAB — BASIC METABOLIC PANEL
BUN: 11 mg/dL (ref 6–23)
CO2: 27 mEq/L (ref 19–32)
Calcium: 9.4 mg/dL (ref 8.4–10.5)
Chloride: 101 mEq/L (ref 96–112)
Creat: 0.59 mg/dL (ref 0.50–1.10)
Glucose, Bld: 78 mg/dL (ref 70–99)
Potassium: 4.3 mEq/L (ref 3.5–5.3)
Sodium: 135 mEq/L (ref 135–145)

## 2014-04-14 LAB — TSH: TSH: 2.384 u[IU]/mL (ref 0.350–4.500)

## 2014-04-15 LAB — URINE CULTURE

## 2014-04-16 ENCOUNTER — Encounter: Payer: Self-pay | Admitting: Nurse Practitioner

## 2014-04-16 DIAGNOSIS — R609 Edema, unspecified: Secondary | ICD-10-CM | POA: Insufficient documentation

## 2014-04-16 NOTE — Progress Notes (Signed)
Subjective:  Presents for several complaints. Having right mid thoracic localized edema and tenderness for the past week. No specific history of injury. Works as a Scientist, water quality. Worse with certain movements. Worse with sneezing. Worse with lying on her right side. No cough or fever. Generalized bloating and swelling in the face abdomen and lower legs over the past 2 weeks. No abdominal pain. Describes a "fullness" in the abdominal area. Facial and hand edema mainly in the mornings. Lower leg and ankle swelling in the afternoon. No change in her diet. No increased salt intake. Is on Lyrica but no recent changes in her medications. Drinks a lot of water. No dysuria or urgency. Thinks she may have decreased urination. No nausea vomiting. No diarrhea or constipation. Decreased appetite. Mild fatigue. No chest pain/ischemic type pain unusual shortness of breath; no orthopnea.   Objective:   BP 124/84  Temp(Src) 98.3 F (36.8 C) (Oral)  Ht 5\' 2"  (1.575 m)  Wt 261 lb (118.389 kg)  BMI 47.73 kg/m2 NAD. Alert, oriented. Lungs clear. Heart regular rate rhythm. No murmur or gallop noted. No JVD. Lower legs and ankles 2-3+ pitting edema. Abdomen soft nondistended nontender. Localized soft area of edema noted in the mid thoracic area just beneath the right scapula along the midclavicular line. Mildly tender to palpation. Good ROM of the right shoulder which produces tenderness in the indicated area. UA shows 3+ leukocytes.urine microscopic occasional WBC, poor sample with multiple epi cells and hair.    Assessment:Edema - Plan: CBC with Differential, Hepatic function panel, Basic metabolic panel, TSH, B Nat Peptide  Muscle strain of right upper back - Plan: CBC with Differential, Hepatic function panel, Basic metabolic panel, TSH, B Nat Peptide  Decreased urination - Plan: POCT urinalysis dipstick, Urine culture, POCT UA - Microscopic Only  Plan: Meds ordered this encounter  Medications  . furosemide (LASIX) 40  MG tablet    Sig: Take 1 tablet (40 mg total) by mouth daily.    Dispense:  30 tablet    Refill:  2    Order Specific Question:  Supervising Provider    Answer:  Mikey Kirschner [[id number]]  . potassium chloride (K-DUR,KLOR-CON) 10 MEQ tablet    Sig: Take 1 tablet (10 mEq total) by mouth 2 (two) times daily.    Dispense:  30 tablet    Refill:  2    Order Specific Question:  Supervising Provider    Answer:  Mikey Kirschner [[id number]]  lab work pending. Obtain a fresh clean catch sample for urine culture. Low-sodium diet. Regular activity. Defers muscle relaxants due to side effects. Icy hot smart relief TENS unit Ice/heat applications Advil as directed Stretching exercises Return if symptoms worsen or fail to improve.

## 2014-04-18 NOTE — Progress Notes (Signed)
Patient notified and verbalized understanding of the test results. No further questions. 

## 2014-06-14 ENCOUNTER — Telehealth: Payer: Self-pay | Admitting: Family Medicine

## 2014-06-14 NOTE — Telephone Encounter (Signed)
Patient has not had chronic pain visit- med last gotten 4/15

## 2014-06-14 NOTE — Telephone Encounter (Signed)
Refill on hydrocodone 500mg 

## 2014-06-15 MED ORDER — HYDROCODONE-ACETAMINOPHEN 5-325 MG PO TABS: ORAL_TABLET | ORAL | Status: DC

## 2014-06-15 NOTE — Telephone Encounter (Signed)
She may have a prescription for hydrocodone #30. One every 4 hours as needed for severe pain use sparingly. When she needs her next prescription patient clearly needs to be told to schedule an appointment ahead of time in order to prescribe pain medicine we need to have to show that we are seeing a person at least every 3 months.

## 2014-06-15 NOTE — Telephone Encounter (Signed)
Patient notified and verbalized understanding of chronic pain OV. Will make appt when she comes to pick up RX

## 2014-06-15 NOTE — Addendum Note (Signed)
Addended byCharolotte Capuchin D on: 06/15/2014 10:06 AM   Modules accepted: Orders

## 2014-06-20 ENCOUNTER — Ambulatory Visit (INDEPENDENT_AMBULATORY_CARE_PROVIDER_SITE_OTHER): Payer: BC Managed Care – PPO | Admitting: Family Medicine

## 2014-06-20 ENCOUNTER — Encounter: Payer: Self-pay | Admitting: Family Medicine

## 2014-06-20 VITALS — BP 134/92 | Ht 62.0 in | Wt 254.0 lb

## 2014-06-20 DIAGNOSIS — M1990 Unspecified osteoarthritis, unspecified site: Secondary | ICD-10-CM

## 2014-06-20 DIAGNOSIS — M129 Arthropathy, unspecified: Secondary | ICD-10-CM

## 2014-06-20 DIAGNOSIS — IMO0001 Reserved for inherently not codable concepts without codable children: Secondary | ICD-10-CM

## 2014-06-20 MED ORDER — TIZANIDINE HCL 4 MG PO TABS: 4.0000 mg | ORAL_TABLET | Freq: Every evening | ORAL | Status: DC | PRN

## 2014-06-20 MED ORDER — TRAMADOL HCL 50 MG PO TABS: 50.0000 mg | ORAL_TABLET | Freq: Three times a day (TID) | ORAL | Status: DC | PRN

## 2014-06-20 NOTE — Progress Notes (Signed)
   Subjective:    Patient ID: Tammy Calderon, female    DOB: Aug 16, 1966, 48 y.o.   MRN: [id number]  HPI Patient is here today for joint pain.  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 Burning sensation in the feet Tender to the touch in the muscles   This is a chronic condition.  She said the pain is worst at night and it is keeping her up at night. She said she is taking the hydrocodone at night, but it is not helping. (I know she just got a refill on it).  Pt does not take anything during the day for pain b/c she said it makes her "loopy".  Advil during the day Hydrocodone hasn't helped much for this, it did help with her back wa  Patient denies any other problems currently. He has a history of fibromyalgia denies being depressed  Review of Systems  HENT: Negative for congestion.   Respiratory: Negative for cough and shortness of breath.   Cardiovascular: Negative for chest pain.  Gastrointestinal: Negative for nausea, vomiting and abdominal pain.  Musculoskeletal: Positive for arthralgias, back pain and myalgias.       Objective:   Physical Exam  Lungs clear heart regular moderate tenderness in all muscle groups. No swelling in the joints are noted. Low back mild tenderness. Neurologic grossly normal.      Assessment & Plan:  FMLA - For the purpose of husband helping her with her care, she will need intermittent visits with doctors as well as intermittent testing. As a result of this she needs her husband to help her get back and forth to the doctor's office. Patient is homebound disabled.  Fibromyalgia we do need to do some tests make sure there is no myositis also to make sure that there is no sign of underlying  rheumatoid factor or other problems. Await the results of these tests.  Zanaflex at nighttime as necessary to try to help her rest also tramadol as necessary for pain cautioned drowsiness followup within 3 months

## 2014-06-21 LAB — SEDIMENTATION RATE: Sed Rate: 12 mm/hr (ref 0–22)

## 2014-06-22 LAB — RHEUMATOID FACTOR: Rhuematoid fact SerPl-aCnc: <10 IU/mL (ref <=14)

## 2014-06-22 LAB — CK: Total CK: 205 U/L — ABNORMAL HIGH (ref 7–177)

## 2014-06-26 DIAGNOSIS — Z0289 Encounter for other administrative examinations: Secondary | ICD-10-CM

## 2014-07-03 ENCOUNTER — Ambulatory Visit: Payer: BC Managed Care – PPO | Admitting: Family Medicine

## 2014-08-08 ENCOUNTER — Telehealth: Payer: Self-pay | Admitting: Family Medicine

## 2014-08-08 MED ORDER — HYDROCODONE-ACETAMINOPHEN 5-325 MG PO TABS: ORAL_TABLET | ORAL | Status: DC

## 2014-08-08 NOTE — Telephone Encounter (Signed)
May refill, OV in late Sept

## 2014-08-08 NOTE — Telephone Encounter (Signed)
Last seen 6/30  Last filled 6/25

## 2014-08-08 NOTE — Telephone Encounter (Signed)
Patient calling to request Rx for hydrocodone.

## 2014-08-08 NOTE — Telephone Encounter (Signed)
Rx up front for patient pick up. Patient notified. 

## 2014-12-13 ENCOUNTER — Ambulatory Visit (INDEPENDENT_AMBULATORY_CARE_PROVIDER_SITE_OTHER): Payer: BC Managed Care – PPO | Admitting: Nurse Practitioner

## 2014-12-13 ENCOUNTER — Encounter: Payer: Self-pay | Admitting: Nurse Practitioner

## 2014-12-13 VITALS — BP 148/88 | Temp 98.3°F | Ht 62.0 in | Wt 250.0 lb

## 2014-12-13 DIAGNOSIS — J0120 Acute ethmoidal sinusitis, unspecified: Secondary | ICD-10-CM

## 2014-12-13 MED ORDER — AZITHROMYCIN 250 MG PO TABS: ORAL_TABLET | ORAL | Status: DC

## 2014-12-13 NOTE — Patient Instructions (Signed)
nasacort AQ as directed

## 2014-12-17 NOTE — Progress Notes (Signed)
Subjective:  Presents complaints of sinus symptoms that began about 10 days ago. Went to urgent care 8 days ago, was prescribed amoxicillin 500 mg, no improvement. Nonproductive cough. Runny nose. Postnasal drainage. Sore throat. Ear pain. Ethmoid sinus area headache. No fever. No wheezing.  Objective:   BP 148/88 mmHg  Temp(Src) 98.3 F (36.8 C)  Ht 5\' 2"  (1.575 m)  Wt 250 lb (113.399 kg)  BMI 45.71 kg/m2 NAD. Alert, oriented. TMs clear effusion, no erythema. Pharynx injected with PND noted. Neck supple with mild soft anterior adenopathy. Lungs clear. Heart regular rate rhythm.  Assessment: Acute ethmoidal sinusitis, recurrence not specified  Plan:  Meds ordered this encounter  Medications  . azithromycin (ZITHROMAX Z-PAK) 250 MG tablet    Sig: Take 2 tablets (500 mg) on  Day 1,  followed by 1 tablet (250 mg) once daily on Days 2 through 5.    Dispense:  6 each    Refill:  0    Order Specific Question:  Supervising Provider    Answer:  Mikey Kirschner [[id number]]   OTC antihistamine and Nasacort AQ as directed. Call back if symptoms worsen or persist.

## 2014-12-19 ENCOUNTER — Ambulatory Visit (INDEPENDENT_AMBULATORY_CARE_PROVIDER_SITE_OTHER): Payer: BC Managed Care – PPO | Admitting: Family Medicine

## 2014-12-19 ENCOUNTER — Encounter: Payer: Self-pay | Admitting: Family Medicine

## 2014-12-19 VITALS — BP 138/80 | Ht 61.0 in | Wt 253.0 lb

## 2014-12-19 DIAGNOSIS — Z79899 Other long term (current) drug therapy: Secondary | ICD-10-CM

## 2014-12-19 DIAGNOSIS — Z13220 Encounter for screening for lipoid disorders: Secondary | ICD-10-CM

## 2014-12-19 DIAGNOSIS — M549 Dorsalgia, unspecified: Secondary | ICD-10-CM

## 2014-12-19 DIAGNOSIS — R600 Localized edema: Secondary | ICD-10-CM

## 2014-12-19 DIAGNOSIS — G8929 Other chronic pain: Secondary | ICD-10-CM

## 2014-12-19 MED ORDER — HYDROCODONE-ACETAMINOPHEN 5-325 MG PO TABS: ORAL_TABLET | ORAL | Status: DC

## 2014-12-19 NOTE — Patient Instructions (Signed)

## 2014-12-19 NOTE — Progress Notes (Signed)
   Subjective:    Patient ID: Tammy Calderon, female    DOB: June 26, 1966, 48 y.o.   MRN: [id number]  HPI This patient was seen today for chronic pain  The medication list was reviewed and updated.   -Compliance with pain medication: yes  The patient was advised the importance of maintaining medication and not using illegal substances with these.  Refills needed: yes  The patient was educated that we can provide 3 monthly scripts for their medication, it is their responsibility to follow the instructions.  Side effects or complications from medications: none  Patient is aware that pain medications are meant to minimize the severity of the pain to allow their pain levels to improve to allow for better function. They are aware of that pain medications cannot totally remove their pain.  Due for UDT ( at least once per year) : next visit  Having pain and stiffness with the weather change.    Discussion regarding her medications she uses albuterol just sparingly, only when she is having flareup does she use steroid inhalers her flareups are very rare she also states her use the Lasix is only when she has fluid retention and she rarely uses muscle relaxer.   Review of Systems    she denies chest tightness pressure pain nausea vomiting diarrhea Objective:   Physical Exam Lungs clear hearts regular pulse normal extremities no significant edema pulse normal BP good       Assessment & Plan:  Chronic lumbar pain-significant obesity patient try to watch diet try to lose some weight. Uses hydrocodone intermittently. She believes 40 tablets will cover her for at least 3 months. Prescription 40 tablets given  Patient does need screening lab work orders written

## 2019-01-19 NOTE — Congregational Nurse Program (Signed)
Seen at the [facility].Stated has history of high blood pressure No medical doctor. B P 166/78 P 68. Blood Glucose 107. Information given for  [facility], reviewed dangers of high blood pressure,importance of exercise, proper diet and getting enough rest. Stated she will make an appointment with [facility] herself due to her work schedule.voiced understanding of things discussed Erma Heritage RN,[facility].

## 2019-01-19 NOTE — Congregational Nurse Program (Signed)
  Dept: [phone]   Congregational Nurse Program Note  Date of Encounter: 01/19/2019  Past Medical History: Past Medical History:  Diagnosis Date  . Asthma     Encounter Details:

## 2019-01-21 ENCOUNTER — Telehealth: Payer: Self-pay | Admitting: Family Medicine

## 2019-01-21 NOTE — Telephone Encounter (Signed)
Application for renewal of disability parking placard form placed in red folder and in Dr.Scott's box.

## 2019-01-23 NOTE — Telephone Encounter (Signed)
Please confirm with patient reason for handicap sticker.  Please fill this and then I will sign.  Patient was last seen here in 2015.  She either needs to follow-up here or other doctor's office or free clinic every few years Preventative health important

## 2019-01-24 NOTE — Telephone Encounter (Signed)
Left message to return call 

## 2019-01-24 NOTE — Telephone Encounter (Signed)
Patient states she sees the free clinic due to lack of insurance now and will pick up the form and take it to them to fill out at her next visit. Form up front for pick up.

## 2019-01-27 NOTE — Congregational Nurse Program (Signed)
Stated she ws started on medication for HTN. Today  132/70 P 83. No complaints or concerns. Continues to monitor her diet closely and trying to get Som exercise Tammy Heritage RN, [facility], [phone]

## 2019-05-12 ENCOUNTER — Ambulatory Visit (INDEPENDENT_AMBULATORY_CARE_PROVIDER_SITE_OTHER): Payer: Self-pay | Admitting: Family Medicine

## 2019-05-12 ENCOUNTER — Other Ambulatory Visit: Payer: Self-pay

## 2019-05-12 ENCOUNTER — Encounter: Payer: Self-pay | Admitting: Family Medicine

## 2019-05-12 DIAGNOSIS — J019 Acute sinusitis, unspecified: Secondary | ICD-10-CM

## 2019-05-12 DIAGNOSIS — J301 Allergic rhinitis due to pollen: Secondary | ICD-10-CM

## 2019-05-12 MED ORDER — AMOXICILLIN 500 MG PO TABS: 500.0000 mg | ORAL_TABLET | Freq: Three times a day (TID) | ORAL | 0 refills | Status: DC

## 2019-05-12 NOTE — Progress Notes (Signed)
   Subjective:    Patient ID: Tammy Calderon, female    DOB: 02-16-1966, 53 y.o.   MRN: [id number]  Otalgia   There is pain in both ears. This is a new problem. The current episode started in the past 7 days. Associated symptoms include coughing and rhinorrhea. Associated symptoms comments: Dry cough.  left ear worse wih pain Some congestion Some headache Significant head congestion drainage coughing sneezing no wheezing or difficulty breathing no vomiting or diarrhea   Review of Systems  Constitutional: Negative for activity change and fever.  HENT: Positive for congestion, ear pain and rhinorrhea.   Eyes: Negative for discharge.  Respiratory: Positive for cough. Negative for shortness of breath and wheezing.   Cardiovascular: Negative for chest pain.   Virtual Visit via Video Note  I connected with Tammy Calderon on 05/12/19 at 10:30 AM EDT by a video enabled telemedicine application and verified that I am speaking with the correct person using two identifiers.  Location: Patient: home Provider: office   I discussed the limitations of evaluation and management by telemedicine and the availability of in person appointments. The patient expressed understanding and agreed to proceed.  History of Present Illness:    Observations/Objective:   Assessment and Plan:   Follow Up Instructions:    I discussed the assessment and treatment plan with the patient. The patient was provided an opportunity to ask questions and all were answered. The patient agreed with the plan and demonstrated an understanding of the instructions.   The patient was advised to call back or seek an in-person evaluation if the symptoms worsen or if the condition fails to improve as anticipated.  I provided 10 minutes of non-face-to-face time during this encounter.         Objective:   Physical Exam  Today's visit was via telephone Physical exam was not possible for this visit       Assessment &  Plan:  Probable acute rhinosinusitis antibiotics prescribed also allergies recommend allergy medicines OTC if ongoing trouble notify us staying home the next few days work excuse given I doubt coronavirus warning signs were discussed safety measures were discussed.  Follow-up if it earache in sinuses or not getting better

## 2019-05-17 ENCOUNTER — Other Ambulatory Visit: Payer: Self-pay | Admitting: *Deleted

## 2019-05-17 ENCOUNTER — Ambulatory Visit: Payer: Self-pay | Admitting: Family Medicine

## 2019-05-17 ENCOUNTER — Telehealth: Payer: Self-pay | Admitting: Family Medicine

## 2019-05-17 ENCOUNTER — Encounter: Payer: Self-pay | Admitting: Family Medicine

## 2019-05-17 MED ORDER — LEVOFLOXACIN 500 MG PO TABS: 500.0000 mg | ORAL_TABLET | Freq: Every day | ORAL | 0 refills | Status: DC

## 2019-05-17 NOTE — Telephone Encounter (Signed)
Pt called back to check status. I informed pt medication has been sent to pharmacy and updated her work note and told her it is ready for pick up

## 2019-05-17 NOTE — Telephone Encounter (Signed)
Levaquin 500 mg 1 daily for the next 7 days May have work excuse through Wednesday If needing longer than that let us know

## 2019-05-17 NOTE — Telephone Encounter (Signed)
Given Amoxil 500mg  at virtual visit 05/12/19

## 2019-05-17 NOTE — Telephone Encounter (Signed)
Pt had phone visit Thursday 05/12/2019, states she's on antibiotic but ear is worse, radiating to jaw & having HA  Would like stronger antibiotic  Also needs work excuse extended  Please advise & call pt   [facility]

## 2019-05-17 NOTE — Telephone Encounter (Signed)
levaquin sent to [facility]

## 2019-05-19 ENCOUNTER — Telehealth: Payer: Self-pay | Admitting: Family Medicine

## 2019-05-19 ENCOUNTER — Encounter: Payer: Self-pay | Admitting: Family Medicine

## 2019-05-19 NOTE — Telephone Encounter (Signed)
Patient is requesting extended work excuse until 6/1.She states still has ear pain  But using the second around of antibiotics. First day out of work was 5/21 -5/31 returning to work 6/1.please advise

## 2019-05-19 NOTE — Telephone Encounter (Signed)
ok 

## 2019-05-19 NOTE — Telephone Encounter (Signed)
Note printed & ready for pick up  Pt notified

## 2019-05-20 ENCOUNTER — Encounter: Payer: Self-pay | Admitting: Family Medicine

## 2020-08-22 ENCOUNTER — Encounter: Payer: Self-pay | Admitting: Family Medicine

## 2021-03-25 ENCOUNTER — Other Ambulatory Visit: Payer: Self-pay

## 2021-03-25 ENCOUNTER — Ambulatory Visit
Admission: EM | Admit: 2021-03-25 | Discharge: 2021-03-25 | Disposition: A | Discharge status: Home / Self Care | Payer: Commercial Managed Care - PPO | Attending: Family Medicine | Admitting: Family Medicine

## 2021-03-25 ENCOUNTER — Ambulatory Visit (INDEPENDENT_AMBULATORY_CARE_PROVIDER_SITE_OTHER): Payer: Commercial Managed Care - PPO

## 2021-03-25 ENCOUNTER — Encounter: Payer: Self-pay | Admitting: Emergency Medicine

## 2021-03-25 DIAGNOSIS — J4541 Moderate persistent asthma with (acute) exacerbation: Secondary | ICD-10-CM

## 2021-03-25 DIAGNOSIS — J209 Acute bronchitis, unspecified: Secondary | ICD-10-CM | POA: Diagnosis not present

## 2021-03-25 DIAGNOSIS — R030 Elevated blood-pressure reading, without diagnosis of hypertension: Secondary | ICD-10-CM | POA: Diagnosis not present

## 2021-03-25 DIAGNOSIS — R0602 Shortness of breath: Secondary | ICD-10-CM | POA: Diagnosis not present

## 2021-03-25 MED ORDER — PREDNISONE 20 MG PO TABS: 40.0000 mg | ORAL_TABLET | Freq: Every day | ORAL | 0 refills | Status: DC

## 2021-03-25 MED ORDER — ALBUTEROL SULFATE HFA 108 (90 BASE) MCG/ACT IN AERS
2.0000 | INHALATION_SPRAY | Freq: Once | RESPIRATORY_TRACT | Status: AC
  Administered 2021-03-25: 2 via RESPIRATORY_TRACT

## 2021-03-25 NOTE — ED Provider Notes (Signed)
[facility]    CSN: [id number] Arrival date & time: 03/25/21  1617      History   Chief Complaint No chief complaint on file.   HPI Tammy Calderon is a 55 y.o. female.   HPI  Patient presents for evaluation of 1 month of wheezing with intermittent shortness of breath with activity. History of reactive airway disease .  Previously prescribed Qvar however lost her insurance and has not been able to restart medication.  Wheezing is causing her to awaken during the middle of the night.  She denies any associated cough or other URI symptoms.  No history of pneumonia.  She has attempted relief with over-the-counter medications for asthma relief without improvement of symptoms.  Past Medical History:  Diagnosis Date  . Asthma     Patient Active Problem List   Diagnosis Date Noted  . Chronic back pain 12/19/2014  . Edema 04/16/2014  . Acute sinusitis 01/13/2014  . Perimenopause 08/22/2013  . Morbid obesity ([place]) 08/22/2013  . Unspecified vitamin D deficiency 05/19/2013  . Neuropathy 05/19/2013  . Fibromyalgia 05/19/2013  . Asthma with acute exacerbation 04/21/2013    Past Surgical History:  Procedure Laterality Date  . NASAL SEPTUM SURGERY    . TUBAL LIGATION      OB History   No obstetric history on file.      Home Medications    Prior to Admission medications   Medication Sig Start Date End Date Taking? Authorizing Provider  predniSONE (DELTASONE) 20 MG tablet Take 2 tablets (40 mg total) by mouth daily with breakfast. 03/25/21  Yes Scot Jun, FNP  albuterol (PROVENTIL HFA;VENTOLIN HFA) 108 (90 BASE) MCG/ACT inhaler Inhale 2 puffs into the lungs every 6 (six) hours as needed for wheezing. 04/21/13   Kathyrn Drown, MD  beclomethasone (QVAR) 80 MCG/ACT inhaler Inhale 2 puffs into the lungs 2 (two) times daily at 8 am and 10 pm. 01/13/14   Kathyrn Drown, MD  furosemide (LASIX) 40 MG tablet Take 1 tablet (40 mg total) by mouth daily. 04/13/14   Nilda Simmer, NP  HYDROcodone-acetaminophen (NORCO/VICODIN) 5-325 MG per tablet TAKE ONE TABLET BY MOUTH EVERY 4 HOURS AS NEEDED FOR SEVERE PAIN. USE SPARINGLY. 12/19/14   Kathyrn Drown, MD  potassium chloride (K-DUR,KLOR-CON) 10 MEQ tablet Take 1 tablet (10 mEq total) by mouth 2 (two) times daily. 04/13/14   Nilda Simmer, NP  tiZANidine (ZANAFLEX) 4 MG tablet Take 1 tablet (4 mg total) by mouth at bedtime as needed for muscle spasms. 06/20/14   Kathyrn Drown, MD    Family History History reviewed. No pertinent family history.  Social History Social History   Tobacco Use  . Smoking status: Never Smoker  . Smokeless tobacco: Never Used  Substance Use Topics  . Alcohol use: No     Allergies   Sulfa antibiotics, Cymbalta [duloxetine hcl], Lyrica [pregabalin], and Neurontin [gabapentin]   Review of Systems Review of Systems Pertinent negatives listed in HPI   Physical Exam Triage Vital Signs ED Triage Vitals  Enc Vitals Group     BP 03/25/21 1630 (!) 149/91     Pulse Rate 03/25/21 1630 80     Resp 03/25/21 1630 18     Temp 03/25/21 1630 98.7 F (37.1 C)     Temp Source 03/25/21 1630 Oral     SpO2 03/25/21 1630 93 %     Weight --      Height --  Head Circumference --      Peak Flow --      Pain Score 03/25/21 1631 0     Pain Loc --      Pain Edu? --      Excl. in [place]? --    No data found.  Updated Vital Signs BP (!) 149/91 (BP Location: Right Arm)   Pulse 80   Temp 98.7 F (37.1 C) (Oral)   Resp 18   LMP 10/06/2011   SpO2 93%   Visual Acuity Right Eye Distance:   Left Eye Distance:   Bilateral Distance:    Right Eye Near:   Left Eye Near:    Bilateral Near:     Physical Exam Constitutional:      General: She is not in acute distress.    Appearance: She is obese.  Cardiovascular:     Rate and Rhythm: Normal rate and regular rhythm.  Pulmonary:     Breath sounds: Wheezing present.     Comments: Diminished generalized lung sounds   Musculoskeletal:     Right lower leg: Edema present.     Left lower leg: Edema present.  Skin:    Capillary Refill: Capillary refill takes less than 2 seconds.  Neurological:     General: No focal deficit present.     Mental Status: She is oriented to person, place, and time.  Psychiatric:        Mood and Affect: Mood normal.        Behavior: Behavior normal.        Thought Content: Thought content normal.        Judgment: Judgment normal.      UC Treatments / Results  Labs (all labs ordered are listed, but only abnormal results are displayed) Labs Reviewed - No data to display  EKG   Radiology DG Chest 2 View  Result Date: 03/25/2021 CLINICAL DATA:  56 year old female with shortness of breath. EXAM: CHEST - 2 VIEW COMPARISON:  Chest radiograph dated 11/06/2011 FINDINGS: Mild diffuse chronic interstitial coarsening and bronchitic changes. No focal consolidation, pleural effusion or pneumothorax. Mild cardiomegaly. No acute osseous pathology. IMPRESSION: No active cardiopulmonary disease. Electronically Signed   By: Anner Crete M.D.   On: 03/25/2021 17:17    Procedures Procedures (including critical care time)  Medications Ordered in UC Medications  albuterol (VENTOLIN HFA) 108 (90 Base) MCG/ACT inhaler 2 puff (2 puffs Inhalation Given 03/25/21 1714)    Initial Impression / Assessment and Plan / UC Course  I have reviewed the triage vital signs and the nursing notes.  Pertinent labs & imaging results that were available during my care of the patient were reviewed by me and considered in my medical decision making (see chart for details).    Patient presents with 1 month of worsening wheezing with a history of reactive airway disease.  Of note her blood pressure is slightly elevated oxygen level averaging in the lower 90s. Increased WOB improved with Albuterol inhaler 2 puffs. Referral placed for follow-up with PCP for ongoing work-up and management of chronic  conditions.  Final Clinical Impressions(s) / UC Diagnoses   Final diagnoses:  Moderate persistent reactive airway disease with acute exacerbation  Acute bronchitis, unspecified organism  Elevated blood pressure reading without diagnosis of hypertension     Discharge Instructions     X-ray is negative for pneumonia. Treating you for bronchitis. I placed a referral for you to receive placement for a primary care provider.  Emergency department if  breathing worsens     ED Prescriptions    Medication Sig Dispense Auth. Provider   predniSONE (DELTASONE) 20 MG tablet Take 2 tablets (40 mg total) by mouth daily with breakfast. 10 tablet Scot Jun, FNP     PDMP not reviewed this encounter.   Scot Jun, FNP 03/28/21 1250

## 2021-03-25 NOTE — Discharge Instructions (Addendum)
X-ray is negative for pneumonia. Treating you for bronchitis. I placed a referral for you to receive placement for a primary care provider.  Emergency department if breathing worsens

## 2021-03-25 NOTE — ED Triage Notes (Signed)
Wheezing x 1 month.

## 2021-04-01 ENCOUNTER — Ambulatory Visit
Admission: EM | Admit: 2021-04-01 | Discharge: 2021-04-01 | Disposition: A | Discharge status: Home / Self Care | Payer: Commercial Managed Care - PPO | Attending: Family Medicine | Admitting: Family Medicine

## 2021-04-01 ENCOUNTER — Other Ambulatory Visit: Payer: Self-pay

## 2021-04-01 ENCOUNTER — Encounter: Payer: Self-pay | Admitting: Emergency Medicine

## 2021-04-01 DIAGNOSIS — R002 Palpitations: Secondary | ICD-10-CM

## 2021-04-01 DIAGNOSIS — R0602 Shortness of breath: Secondary | ICD-10-CM

## 2021-04-01 DIAGNOSIS — R030 Elevated blood-pressure reading, without diagnosis of hypertension: Secondary | ICD-10-CM | POA: Diagnosis not present

## 2021-04-01 HISTORY — DX: Bell's palsy: G51.0

## 2021-04-01 HISTORY — DX: Essential (primary) hypertension: I10

## 2021-04-01 HISTORY — DX: Fibromyalgia: M79.7

## 2021-04-01 MED ORDER — LISINOPRIL-HYDROCHLOROTHIAZIDE 20-12.5 MG PO TABS: 1.0000 | ORAL_TABLET | Freq: Every day | ORAL | 0 refills | Status: DC

## 2021-04-01 NOTE — ED Provider Notes (Addendum)
[facility]    CSN: [id number] Arrival date & time: 04/01/21  1748      History   Chief Complaint Chief Complaint  Patient presents with  . Palpitations    HPI Tammy Calderon is a 55 y.o. female.   HPI  Patient seen here UC one week for acute asthma exacerbation. Asthma symptom improved. In today for ongoing SOB, palpitations, and sensation of tongue numbness. Patient developed these symptoms went work, arrived here at [facility] approximately 2 hours as she declined evaluation in the ER. She reports today a hypertension diagnosis over one year ago and placed Lisinopril with diuretic. Discontinued medication as she grew tired of frequent urination associated with medication. Endorses chest pain off and on.cPatient awakened with with heart palpitations, endorses sensation of heart pounding and feeling jittery all day. Endorses occasional light headed at times. No more wheezing. Albuterol is not helping with SOb although resolves wheezing. Past Medical History:  Diagnosis Date  . Asthma   . Bell's palsy   . Fibromyalgia   . Hypertension     Patient Active Problem List   Diagnosis Date Noted  . Chronic back pain 12/19/2014  . Edema 04/16/2014  . Acute sinusitis 01/13/2014  . Perimenopause 08/22/2013  . Morbid obesity ([place]) 08/22/2013  . Unspecified vitamin D deficiency 05/19/2013  . Neuropathy 05/19/2013  . Fibromyalgia 05/19/2013  . Asthma with acute exacerbation 04/21/2013    Past Surgical History:  Procedure Laterality Date  . NASAL SEPTUM SURGERY    . TUBAL LIGATION      OB History   No obstetric history on file.      Home Medications    Prior to Admission medications   Medication Sig Start Date End Date Taking? Authorizing Provider  lisinopril-hydrochlorothiazide (ZESTORETIC) 20-12.5 MG tablet Take 1 tablet by mouth daily. 04/01/21  Yes Scot Jun, FNP  albuterol (PROVENTIL HFA;VENTOLIN HFA) 108 (90 BASE) MCG/ACT inhaler Inhale 2 puffs into the  lungs every 6 (six) hours as needed for wheezing. 04/21/13   Kathyrn Drown, MD  beclomethasone (QVAR) 80 MCG/ACT inhaler Inhale 2 puffs into the lungs 2 (two) times daily at 8 am and 10 pm. 01/13/14   Kathyrn Drown, MD  furosemide (LASIX) 40 MG tablet Take 1 tablet (40 mg total) by mouth daily. 04/13/14   Nilda Simmer, NP  HYDROcodone-acetaminophen (NORCO/VICODIN) 5-325 MG per tablet TAKE ONE TABLET BY MOUTH EVERY 4 HOURS AS NEEDED FOR SEVERE PAIN. USE SPARINGLY. 12/19/14   Kathyrn Drown, MD  potassium chloride (K-DUR,KLOR-CON) 10 MEQ tablet Take 1 tablet (10 mEq total) by mouth 2 (two) times daily. 04/13/14   Nilda Simmer, NP  predniSONE (DELTASONE) 20 MG tablet Take 2 tablets (40 mg total) by mouth daily with breakfast. 03/25/21   Scot Jun, FNP  tiZANidine (ZANAFLEX) 4 MG tablet Take 1 tablet (4 mg total) by mouth at bedtime as needed for muscle spasms. 06/20/14   Kathyrn Drown, MD    Family History No family history on file.  Social History Social History   Tobacco Use  . Smoking status: Never Smoker  . Smokeless tobacco: Never Used  Substance Use Topics  . Alcohol use: No  . Drug use: Never     Allergies   Sulfa antibiotics, Cymbalta [duloxetine hcl], Lyrica [pregabalin], and Neurontin [gabapentin]   Review of Systems Review of Systems Pertinent negatives listed in HPI   Physical Exam Triage Vital Signs ED Triage Vitals  Enc Vitals Group  BP      Pulse      Resp      Temp      Temp src      SpO2      Weight      Height      Head Circumference      Peak Flow      Pain Score      Pain Loc      Pain Edu?      Excl. in [place]?    No data found.  Updated Vital Signs BP (!) 157/92 (BP Location: Right Wrist)   Pulse 67   Temp 98.4 F (36.9 C) (Oral)   Resp 19   Ht 5\' 1"  (1.549 m)   LMP 10/06/2011   SpO2 98%   BMI 47.80 kg/m   Visual Acuity Right Eye Distance:   Left Eye Distance:   Bilateral Distance:    Right Eye Near:   Left  Eye Near:    Bilateral Near:     Physical Exam  Constitutional: Patient obese, no acute distress, cooperative  HENT: Normocephalic, atraumatic. CVS: RRR, S1/S2 +, no murmurs, no gallops, no carotid bruit.  Pulmonary: Effort and breath sounds normal, no stridor, rhonchi, wheezes, rales.  Abdominal: Soft. BS +,  distension, no tenderness, no rebound or guarding.  Musculoskeletal: Normal range of motion. Trace edema present BLE  Neuro: Alert. Normal reflexes, muscle tone coordination. No cranial nerve deficit. Skin: Skin is warm and dry. No rash noted. Not diaphoretic. No erythema. No pallor. Psychiatric: Normal mood and affect. Behavior, judgment, thought content normal.  UC Treatments / Results  Labs (all labs ordered are listed, but only abnormal results are displayed) Labs Reviewed  BASIC METABOLIC PANEL    EKG NSR, no acute ST changes, no LVH noted   Radiology No results found.  Procedures Procedures (including critical care time)  Medications Ordered in UC Medications - No data to display  Initial Impression / Assessment and Plan / UC Course  I have reviewed the triage vital signs and the nursing notes.  Pertinent labs & imaging results that were available during my care of the patient were reviewed by me and considered in my medical decision making (see chart for details).   discussed with patient symptoms are concerning for cardiac involvement especially given new knowledge that patient discontinued medication management of hypertension. Restarting lisinopril=HCTZ. BMP pending to evaluate renal and electrolyte status. Referral placed for evaluation and further work-up with cardiology. Strict ER precautions given and educated that acute cardiac symptoms warranted appropriate evaluation in ER not UC. Overall patient VSS. EKG reassuring. Patient verbalized understanding and agreement with plan. Final Clinical Impressions(s) / UC Diagnoses   Final diagnoses:  Palpitations   Elevated BP without diagnosis of hypertension  Shortness of breath     Discharge Instructions     If any of your symptoms worsen prior to follow-up with cardiology please go to Emergency Department.     ED Prescriptions    Medication Sig Dispense Auth. Provider   lisinopril-hydrochlorothiazide (ZESTORETIC) 20-12.5 MG tablet Take 1 tablet by mouth daily. 90 tablet Scot Jun, FNP     PDMP not reviewed this encounter.   Scot Jun, FNP 04/01/21 2330    Scot Jun, FNP 04/01/21 2330

## 2021-04-01 NOTE — Discharge Instructions (Addendum)
If any of your symptoms worsen prior to follow-up with cardiology please go to Emergency Department.

## 2021-04-01 NOTE — ED Triage Notes (Signed)
Pt reports she has had heart palpitations and numbness to LT side of her tongue that she noticed as soon as she woke up this morning at 0530.    Pt has been having issues with shortness of breath since she was seen here for the same on 04/04

## 2021-04-03 LAB — BASIC METABOLIC PANEL
BUN/Creatinine Ratio: 19 (ref 9–23)
BUN: 13 mg/dL (ref 6–24)
CO2: 25 mmol/L (ref 20–29)
Calcium: 9.4 mg/dL (ref 8.7–10.2)
Chloride: 100 mmol/L (ref 96–106)
Creatinine, Ser: 0.69 mg/dL (ref 0.57–1.00)
Glucose: 84 mg/dL (ref 65–99)
Potassium: 3.9 mmol/L (ref 3.5–5.2)
Sodium: 143 mmol/L (ref 134–144)
eGFR: 104 mL/min/{1.73_m2} (ref >59)

## 2021-07-17 ENCOUNTER — Other Ambulatory Visit: Payer: Self-pay | Admitting: Family Medicine

## 2021-07-19 ENCOUNTER — Other Ambulatory Visit: Payer: Self-pay | Admitting: Family Medicine

## 2021-07-24 ENCOUNTER — Encounter: Payer: Self-pay | Admitting: Emergency Medicine

## 2021-07-24 ENCOUNTER — Ambulatory Visit
Admission: EM | Admit: 2021-07-24 | Discharge: 2021-07-24 | Disposition: A | Discharge status: Home / Self Care | Payer: Commercial Managed Care - PPO | Attending: Family Medicine | Admitting: Family Medicine

## 2021-07-24 DIAGNOSIS — I10 Essential (primary) hypertension: Secondary | ICD-10-CM | POA: Insufficient documentation

## 2021-07-24 DIAGNOSIS — J4521 Mild intermittent asthma with (acute) exacerbation: Secondary | ICD-10-CM | POA: Diagnosis present

## 2021-07-24 DIAGNOSIS — N3090 Cystitis, unspecified without hematuria: Secondary | ICD-10-CM | POA: Diagnosis present

## 2021-07-24 LAB — POCT URINALYSIS DIP (MANUAL ENTRY)
Bilirubin, UA: NEGATIVE
Glucose, UA: NEGATIVE mg/dL
Ketones, POC UA: NEGATIVE mg/dL
Nitrite, UA: NEGATIVE
Protein Ur, POC: NEGATIVE mg/dL
Spec Grav, UA: >=1.03 — AB (ref 1.010–1.025)
Urobilinogen, UA: 0.2 E.U./dL
pH, UA: 5 (ref 5.0–8.0)

## 2021-07-24 MED ORDER — FUROSEMIDE 40 MG PO TABS: 40.0000 mg | ORAL_TABLET | Freq: Every day | ORAL | 0 refills | Status: DC

## 2021-07-24 MED ORDER — CEPHALEXIN 500 MG PO CAPS: 500.0000 mg | ORAL_CAPSULE | Freq: Two times a day (BID) | ORAL | 0 refills | Status: DC

## 2021-07-24 MED ORDER — ALBUTEROL SULFATE HFA 108 (90 BASE) MCG/ACT IN AERS: 2.0000 | INHALATION_SPRAY | Freq: Four times a day (QID) | RESPIRATORY_TRACT | 2 refills | Status: DC | PRN

## 2021-07-24 MED ORDER — LISINOPRIL-HYDROCHLOROTHIAZIDE 20-12.5 MG PO TABS: 1.0000 | ORAL_TABLET | Freq: Every day | ORAL | 2 refills | Status: DC

## 2021-07-24 NOTE — ED Triage Notes (Signed)
States she is out of her BP medication lisinopril.    Has noticed blood in her urine, and right lower quad pain.  Urinary urgency x 1 month

## 2021-07-24 NOTE — ED Provider Notes (Signed)
[facility]    ASSESSMENT & PLAN:  1. Elevated blood pressure reading in office with diagnosis of hypertension   2. Cystitis   3. Mild intermittent asthma with acute exacerbation     Meds ordered this encounter  Medications   furosemide (LASIX) 40 MG tablet    Sig: Take 1 tablet (40 mg total) by mouth daily.    Dispense:  30 tablet    Refill:  0   lisinopril-hydrochlorothiazide (ZESTORETIC) 20-12.5 MG tablet    Sig: Take 1 tablet by mouth daily.    Dispense:  30 tablet    Refill:  2   albuterol (VENTOLIN HFA) 108 (90 Base) MCG/ACT inhaler    Sig: Inhale 2 puffs into the lungs every 6 (six) hours as needed for wheezing.    Dispense:  1 each    Refill:  2   cephALEXin (KEFLEX) 500 MG capsule    Sig: Take 1 capsule (500 mg total) by mouth 2 (two) times daily.    Dispense:  10 capsule    Refill:  0   No signs of pyelonephritis. Discussed. Urine culture sent.  Recommend:  Follow-up Information     Schedule an appointment as soon as possible for a visit  with Kathyrn Drown, MD.   Specialty: Family Medicine Contact information: [place] [address] [phone]                Outlined signs and symptoms indicating need for more acute intervention. Patient verbalized understanding. After Visit Summary given.  SUBJECTIVE:  Tammy Calderon is a 55 y.o. female who questions UTI. Gross hematuria noted with urinary urgency. Past day. Without associated flank pain, fever, chills, vaginal discharge or bleeding. No specific aggravating or alleviating factors reported. No LE edema. Normal PO intake without n/v/d. Without specific abdominal pain. Ambulatory without difficulty. OTC treatment: none.  LMP: Patient's last menstrual period was 10/06/2011.  Increased blood pressure noted today. Reports that she is treated for HTN. Has been out of medication for 2 days. She reports no orthostatic dizziness or lightheadedness, no orthopnea or  paroxysmal nocturnal dyspnea, no palpitations, and no intermittent claudication symptoms. Does reports mild LE edema; needs Lasix refill.  Also requests albuterol MDI refill. Occasional wheezing. No current SOB.  OBJECTIVE:  Vitals:   07/24/21 1836  BP: (!) 141/93  Pulse: 89  Resp: 16  Temp: 98.5 F (36.9 C)  TempSrc: Oral  SpO2: 96%   General appearance: alert; no distress Lungs: unlabored respirations; clear Back: no CVA tenderness Extremities: no edema; symmetrical with no gross deformities Skin: warm and dry Neurologic: normal gait Psychological: alert and cooperative; normal mood and affect  Labs Reviewed  POCT URINALYSIS DIP (MANUAL ENTRY) - Abnormal; Notable for the following components:      Result Value   Spec Grav, UA >=1.030 (*)    Blood, UA large (*)    Leukocytes, UA Trace (*)    All other components within normal limits  URINE CULTURE    Allergies  Allergen Reactions   Sulfa Antibiotics    Cymbalta [Duloxetine Hcl]     sedated   Lyrica [Pregabalin]     drowsy   Neurontin [Gabapentin]     drowsy    Past Medical History:  Diagnosis Date   Asthma    Bell's palsy    Fibromyalgia    Hypertension    Social History   Socioeconomic History   Marital status: Married    Spouse  name: Not on file   Number of children: Not on file   Years of education: Not on file   Highest education level: Not on file  Occupational History   Not on file  Tobacco Use   Smoking status: Never   Smokeless tobacco: Never  Substance and Sexual Activity   Alcohol use: No   Drug use: Never   Sexual activity: Not on file  Other Topics Concern   Not on file  Social History Narrative   Not on file   Social Determinants of Health   Financial Resource Strain: Not on file  Food Insecurity: Not on file  Transportation Needs: Not on file  Physical Activity: Not on file  Stress: Not on file  Social Connections: Not on file  Intimate Partner Violence: Not on file    History reviewed. No pertinent family history.      Vanessa Kick, MD 07/25/21 [phone]

## 2021-07-27 LAB — URINE CULTURE: Culture: 40000 — AB

## 2021-07-29 ENCOUNTER — Telehealth (HOSPITAL_COMMUNITY): Payer: Self-pay | Admitting: Emergency Medicine

## 2021-07-29 MED ORDER — NITROFURANTOIN MONOHYD MACRO 100 MG PO CAPS: 100.0000 mg | ORAL_CAPSULE | Freq: Two times a day (BID) | ORAL | 0 refills | Status: DC

## 2021-07-29 NOTE — Telephone Encounter (Signed)
Patient wanted prescription sent to a different pharmacy 

## 2021-07-31 ENCOUNTER — Telehealth: Payer: Self-pay | Admitting: Family Medicine

## 2021-07-31 DIAGNOSIS — Z13220 Encounter for screening for lipoid disorders: Secondary | ICD-10-CM

## 2021-07-31 DIAGNOSIS — Z131 Encounter for screening for diabetes mellitus: Secondary | ICD-10-CM

## 2021-07-31 DIAGNOSIS — Z79899 Other long term (current) drug therapy: Secondary | ICD-10-CM

## 2021-07-31 NOTE — Telephone Encounter (Signed)
Metabolic 7, lipid, liver 

## 2021-07-31 NOTE — Telephone Encounter (Signed)
Patient has physical 9/8 and needing labs

## 2021-08-01 NOTE — Telephone Encounter (Signed)
Patient is aware that the orders have been sent to the lab. She will have lab work done prior to appt

## 2021-08-01 NOTE — Telephone Encounter (Signed)
Blood work ordered in Epic. Left message to return call 

## 2021-08-04 ENCOUNTER — Ambulatory Visit
Admission: EM | Admit: 2021-08-04 | Discharge: 2021-08-04 | Disposition: A | Discharge status: Home / Self Care | Payer: Commercial Managed Care - PPO | Attending: Family Medicine | Admitting: Family Medicine

## 2021-08-04 ENCOUNTER — Encounter: Payer: Self-pay | Admitting: Emergency Medicine

## 2021-08-04 DIAGNOSIS — U071 COVID-19: Secondary | ICD-10-CM

## 2021-08-04 MED ORDER — NIRMATRELVIR/RITONAVIR (PAXLOVID)TABLET: 3.0000 | ORAL_TABLET | Freq: Two times a day (BID) | ORAL | 0 refills | Status: AC

## 2021-08-04 MED ORDER — PROMETHAZINE-DM 6.25-15 MG/5ML PO SYRP: 5.0000 mL | ORAL_SOLUTION | Freq: Three times a day (TID) | ORAL | 0 refills | Status: DC | PRN

## 2021-08-04 NOTE — ED Triage Notes (Signed)
Headache that started yesterday.  Had positive home covid test today.

## 2021-08-04 NOTE — Discharge Instructions (Addendum)
You are positive for COVID-19 You may return to work on 08/12/21. Negative results are immediately resulted to Mychart. Positive results will  Alternate Tylenol and ibuprofen as needed for body aches and fever.  Symptom management per recommendations discussed today.  If any breathing difficulty or chest pain develops go immediately to the closest emergency department for evaluation.

## 2021-08-30 ENCOUNTER — Encounter: Payer: Self-pay | Admitting: Family Medicine

## 2021-09-04 ENCOUNTER — Telehealth: Payer: Self-pay | Admitting: Family Medicine

## 2021-09-04 NOTE — Telephone Encounter (Signed)
Lab orders placed back in August. Left message to return call

## 2021-09-04 NOTE — Telephone Encounter (Signed)
Patient has physical on 09/10/21 she is requesting lab orders to be sent to [facility]   [phone]

## 2021-09-05 NOTE — Telephone Encounter (Signed)
Patient returned call and aware she can get labs

## 2021-09-10 ENCOUNTER — Ambulatory Visit (INDEPENDENT_AMBULATORY_CARE_PROVIDER_SITE_OTHER): Payer: Commercial Managed Care - PPO | Admitting: Family Medicine

## 2021-09-10 ENCOUNTER — Encounter: Payer: Self-pay | Admitting: Family Medicine

## 2021-09-10 ENCOUNTER — Other Ambulatory Visit: Payer: Self-pay

## 2021-09-10 VITALS — BP 104/73 | HR 71 | Temp 97.0°F | Ht 60.0 in | Wt 255.0 lb

## 2021-09-10 DIAGNOSIS — Z0000 Encounter for general adult medical examination without abnormal findings: Secondary | ICD-10-CM

## 2021-09-10 DIAGNOSIS — Z0001 Encounter for general adult medical examination with abnormal findings: Secondary | ICD-10-CM | POA: Diagnosis not present

## 2021-09-10 DIAGNOSIS — E785 Hyperlipidemia, unspecified: Secondary | ICD-10-CM | POA: Diagnosis not present

## 2021-09-10 DIAGNOSIS — Z79899 Other long term (current) drug therapy: Secondary | ICD-10-CM

## 2021-09-10 DIAGNOSIS — Z1231 Encounter for screening mammogram for malignant neoplasm of breast: Secondary | ICD-10-CM

## 2021-09-10 DIAGNOSIS — R300 Dysuria: Secondary | ICD-10-CM | POA: Diagnosis not present

## 2021-09-10 MED ORDER — HYDROCODONE-ACETAMINOPHEN 5-325 MG PO TABS: ORAL_TABLET | ORAL | 0 refills | Status: DC

## 2021-09-10 MED ORDER — LISINOPRIL-HYDROCHLOROTHIAZIDE 20-12.5 MG PO TABS: 1.0000 | ORAL_TABLET | Freq: Every day | ORAL | 2 refills | Status: DC

## 2021-09-10 NOTE — Progress Notes (Signed)
   Subjective:    Patient ID: Tammy Calderon, female    DOB: 05-Nov-1966, 55 y.o.   MRN: [id number]  HPI The patient comes in today for a wellness visit.    A review of their health history was completed.  A review of medications was also completed.  Any needed refills; albuterol inhaler; pt was on Hydrocodone but has not taken it in a while. Would like script for Hydrocodone due to tooth pain   Eating habits: not healthy   Falls/  MVA accidents in past few months: none  Regular exercise: not like she should   Specialist pt sees on regular basis: none  Preventative health issues were discussed.   Additional concerns:     Review of Systems     Objective:   Physical Exam  General-in no acute distress Eyes-no discharge Lungs-respiratory rate normal, CTA CV-no murmurs,RRR Extremities skin warm dry no edema Neuro grossly normal Behavior normal, alert       Assessment & Plan:  1. Well adult exam Adult wellness-complete.wellness physical was conducted today. Importance of diet and exercise were discussed in detail.  In addition to this a discussion regarding safety was also covered. We also reviewed over immunizations and gave recommendations regarding current immunization needed for age.  In addition to this additional areas were also touched on including: Preventative health exams needed:  Colonoscopy colonoscopy recommended patient unfortunately does not have insurance currently  Patient was advised yearly wellness exam   2. Hyperlipidemia, unspecified hyperlipidemia type Check lipid profile watch diet - Lipid panel  3. Dysuria Patient to follow-up next week after she is off her antibiotics for her tooth infection to give a urine specimen to check for WBCs and RBCs  4. High risk medication use Lab work ordered - Medical laboratory scientific officer - Hepatic function panel  5. Screening mammogram for breast cancer Mammogram ordered - MM DIGITAL SCREENING BILATERAL

## 2021-09-19 ENCOUNTER — Other Ambulatory Visit (HOSPITAL_COMMUNITY)
Admission: RE | Admit: 2021-09-19 | Discharge: 2021-09-19 | Disposition: A | Discharge status: Home / Self Care | Payer: Commercial Managed Care - PPO | Source: Ambulatory Visit | Attending: Family Medicine | Admitting: Family Medicine

## 2021-09-19 ENCOUNTER — Other Ambulatory Visit (INDEPENDENT_AMBULATORY_CARE_PROVIDER_SITE_OTHER): Payer: Commercial Managed Care - PPO

## 2021-09-19 ENCOUNTER — Other Ambulatory Visit: Payer: Self-pay

## 2021-09-19 ENCOUNTER — Encounter (HOSPITAL_COMMUNITY): Payer: Self-pay

## 2021-09-19 ENCOUNTER — Other Ambulatory Visit: Payer: Self-pay | Admitting: Family Medicine

## 2021-09-19 ENCOUNTER — Telehealth: Payer: Self-pay | Admitting: Family Medicine

## 2021-09-19 ENCOUNTER — Ambulatory Visit (HOSPITAL_COMMUNITY): Admission: RE | Admit: 2021-09-19 | Payer: Commercial Managed Care - PPO | Source: Ambulatory Visit

## 2021-09-19 ENCOUNTER — Ambulatory Visit (HOSPITAL_COMMUNITY)
Admission: RE | Admit: 2021-09-19 | Discharge: 2021-09-19 | Disposition: A | Discharge status: Home / Self Care | Payer: Commercial Managed Care - PPO | Source: Ambulatory Visit | Attending: Family Medicine | Admitting: Family Medicine

## 2021-09-19 DIAGNOSIS — Z1231 Encounter for screening mammogram for malignant neoplasm of breast: Secondary | ICD-10-CM | POA: Diagnosis not present

## 2021-09-19 DIAGNOSIS — R300 Dysuria: Secondary | ICD-10-CM | POA: Diagnosis not present

## 2021-09-19 DIAGNOSIS — Z79899 Other long term (current) drug therapy: Secondary | ICD-10-CM | POA: Insufficient documentation

## 2021-09-19 DIAGNOSIS — E785 Hyperlipidemia, unspecified: Secondary | ICD-10-CM | POA: Insufficient documentation

## 2021-09-19 LAB — HEPATIC FUNCTION PANEL
ALT: 53 U/L — ABNORMAL HIGH (ref 0–44)
AST: 42 U/L — ABNORMAL HIGH (ref 15–41)
Albumin: 4.3 g/dL (ref 3.5–5.0)
Alkaline Phosphatase: 127 U/L — ABNORMAL HIGH (ref 38–126)
Bilirubin, Direct: 0.1 mg/dL (ref 0.0–0.2)
Indirect Bilirubin: 0.7 mg/dL (ref 0.3–0.9)
Total Bilirubin: 0.8 mg/dL (ref 0.3–1.2)
Total Protein: 8 g/dL (ref 6.5–8.1)

## 2021-09-19 LAB — BASIC METABOLIC PANEL
Anion gap: 9 (ref 5–15)
BUN: 23 mg/dL — ABNORMAL HIGH (ref 6–20)
CO2: 27 mmol/L (ref 22–32)
Calcium: 9.6 mg/dL (ref 8.9–10.3)
Chloride: 100 mmol/L (ref 98–111)
Creatinine, Ser: 0.72 mg/dL (ref 0.44–1.00)
GFR, Estimated: >60 mL/min (ref >60)
Glucose, Bld: 115 mg/dL — ABNORMAL HIGH (ref 70–99)
Potassium: 4.5 mmol/L (ref 3.5–5.1)
Sodium: 136 mmol/L (ref 135–145)

## 2021-09-19 LAB — POCT URINALYSIS DIPSTICK
Protein, UA: POSITIVE — AB
Spec Grav, UA: >=1.03 — AB (ref 1.010–1.025)
pH, UA: 5 (ref 5.0–8.0)

## 2021-09-19 LAB — LIPID PANEL
Cholesterol: 298 mg/dL — ABNORMAL HIGH (ref 0–200)
HDL: 66 mg/dL (ref >40)
LDL Cholesterol: 211 mg/dL — ABNORMAL HIGH (ref 0–99)
Total CHOL/HDL Ratio: 4.5 RATIO
Triglycerides: 104 mg/dL (ref <150)
VLDL: 21 mg/dL (ref 0–40)

## 2021-09-19 NOTE — Telephone Encounter (Signed)
Urine does show some occasional WBCs I recommend culturing the urine.  Please see how patient is doing is she having dysuria or discomfort with urination?   If she is not having any symptoms I recommend hold off on any additional antibiotics until the culture comes back.  If she is having UTI symptoms I need to know about that we would utilize antibiotic for the 5 days while awaiting on culture thank you

## 2021-09-19 NOTE — Telephone Encounter (Signed)
Pt contacted. Pt states she is not having any burning, dysuria or painful urination. However she is bleeding; she sees it when she wipes and sometime in the toilet. It is bright red. Not a heavy flow. Pt did go to Urgent Care when this first began and they prescribed strong antibiotics; did go away but then it started again. Urine sent for culture. Please advise. Thank you  [facility]

## 2021-09-19 NOTE — Telephone Encounter (Signed)
Pt gave urine sample. Urine sample dipped and spun. Please advise. Thank you

## 2021-09-19 NOTE — Telephone Encounter (Signed)
Very important question  Is the patient having vaginal bleeding or bleeding in the urine when she urinates?

## 2021-09-20 ENCOUNTER — Other Ambulatory Visit: Payer: Self-pay | Admitting: *Deleted

## 2021-09-20 DIAGNOSIS — E785 Hyperlipidemia, unspecified: Secondary | ICD-10-CM

## 2021-09-20 DIAGNOSIS — Z79899 Other long term (current) drug therapy: Secondary | ICD-10-CM

## 2021-09-20 MED ORDER — SIMVASTATIN 20 MG PO TABS: 20.0000 mg | ORAL_TABLET | Freq: Every day | ORAL | 0 refills | Status: DC

## 2021-09-20 MED ORDER — CIPROFLOXACIN HCL 500 MG PO TABS: ORAL_TABLET | ORAL | 0 refills | Status: DC

## 2021-09-20 NOTE — Telephone Encounter (Signed)
Pt bleeding in the urine when urinating. Pt states she does not have a cycle any more; just blood when urinating. Please advise. Thank you

## 2021-09-20 NOTE — Telephone Encounter (Signed)
Antibiotic sent to pharmacy. Left message to return call.

## 2021-09-20 NOTE — Addendum Note (Signed)
Addended by: Vicente Males on: 09/20/2021 09:26 AM   Modules accepted: Orders

## 2021-09-20 NOTE — Telephone Encounter (Signed)
Await culture results  Cipro 500 mg 1 twice daily for 5 days May need further testing and/or evaluation depending on the culture results  Also if this area persists over the course of the next 10 to 14 days despite treating with antibiotics we would desire to help set up an consultation with urology

## 2021-09-20 NOTE — Telephone Encounter (Signed)
Pt returned call but nurse was busy. Nurse returned call and informed pt that med was sent in and to call if issue persist. Pt verbalized understanding.

## 2021-09-22 LAB — URINE CULTURE

## 2021-09-24 ENCOUNTER — Encounter: Payer: Self-pay | Admitting: Family Medicine

## 2021-09-25 ENCOUNTER — Other Ambulatory Visit (HOSPITAL_COMMUNITY): Payer: Self-pay | Admitting: Family Medicine

## 2021-09-25 DIAGNOSIS — R928 Other abnormal and inconclusive findings on diagnostic imaging of breast: Secondary | ICD-10-CM

## 2021-09-30 ENCOUNTER — Other Ambulatory Visit: Payer: Self-pay | Admitting: *Deleted

## 2021-10-01 ENCOUNTER — Telehealth: Payer: Self-pay | Admitting: Family Medicine

## 2021-10-01 NOTE — Telephone Encounter (Signed)
Patient wanted you to know still having bleeding when she urinates. She finished up antibiotic on 09/29/21.

## 2021-10-01 NOTE — Telephone Encounter (Signed)
So I am concerned regarding the fact that she is still seeing blood I believe it is in her best interest to see urology please set her up with Dr. Felipa [facility] [place] office thank you

## 2021-10-01 NOTE — Telephone Encounter (Signed)
Pt contacted. No burning but having some left side pain that just started in the past couple days.  Bleeding just when wiping. Bright red. Last dose taken on Sunday 09/29/21. Please advise. Thank you (Pt verbalized OK to leave message on voicemail)

## 2021-10-02 ENCOUNTER — Telehealth: Payer: Self-pay | Admitting: Family Medicine

## 2021-10-02 ENCOUNTER — Other Ambulatory Visit: Payer: Self-pay

## 2021-10-02 DIAGNOSIS — R319 Hematuria, unspecified: Secondary | ICD-10-CM

## 2021-10-02 NOTE — Telephone Encounter (Signed)
Left message for patient to inform her per urology orders placed.

## 2021-10-09 ENCOUNTER — Telehealth: Payer: Self-pay | Admitting: Family Medicine

## 2021-10-09 NOTE — Telephone Encounter (Signed)
So I would be concerned that if she was utilizing Lasix on a regular basis this could affect her potassium Currently she is on a blood pressure medicine that has a diuretic included within the medicine  Is she having swelling in her legs? Please find out from the patient how often is she utilizing Lasix?

## 2021-10-09 NOTE — Telephone Encounter (Signed)
Left message to return call 

## 2021-10-09 NOTE — Telephone Encounter (Signed)
[facility] requesting refill on Lasix 40 mg tablet. Take one tablet po once daily. Last filled by Dr.Hagler (urgent care provider in August). Please advise. Thank you.

## 2021-10-11 NOTE — Telephone Encounter (Signed)
Patient states she has occasional swelling in the legs and does not take Lasix on regular basis -has not taken in over 2 weeks-takes it occassionally as needed

## 2021-10-14 NOTE — Telephone Encounter (Signed)
Use only when swelling is significant Lasix 20 mg 1 every morning as needed significant swelling not for frequent use #14

## 2021-10-15 ENCOUNTER — Other Ambulatory Visit (HOSPITAL_COMMUNITY): Payer: Self-pay | Admitting: Family Medicine

## 2021-10-15 ENCOUNTER — Ambulatory Visit (HOSPITAL_COMMUNITY)
Admission: RE | Admit: 2021-10-15 | Discharge: 2021-10-15 | Disposition: A | Discharge status: Home / Self Care | Payer: Self-pay | Source: Ambulatory Visit | Attending: Family Medicine | Admitting: Family Medicine

## 2021-10-15 ENCOUNTER — Other Ambulatory Visit: Payer: Self-pay

## 2021-10-15 DIAGNOSIS — R928 Other abnormal and inconclusive findings on diagnostic imaging of breast: Secondary | ICD-10-CM | POA: Insufficient documentation

## 2021-10-15 MED ORDER — FUROSEMIDE 20 MG PO TABS: ORAL_TABLET | ORAL | 0 refills | Status: DC

## 2021-10-16 ENCOUNTER — Encounter: Payer: Self-pay | Admitting: Urology

## 2021-10-16 ENCOUNTER — Ambulatory Visit (INDEPENDENT_AMBULATORY_CARE_PROVIDER_SITE_OTHER): Payer: Commercial Managed Care - PPO | Admitting: Urology

## 2021-10-16 VITALS — BP 146/76 | HR 77 | Temp 98.8°F

## 2021-10-16 DIAGNOSIS — R310 Gross hematuria: Secondary | ICD-10-CM | POA: Insufficient documentation

## 2021-10-16 DIAGNOSIS — Z87440 Personal history of urinary (tract) infections: Secondary | ICD-10-CM

## 2021-10-16 LAB — URINALYSIS, ROUTINE W REFLEX MICROSCOPIC
Bilirubin, UA: NEGATIVE
Glucose, UA: NEGATIVE
Ketones, UA: NEGATIVE
Leukocytes,UA: NEGATIVE
Nitrite, UA: NEGATIVE
Protein,UA: NEGATIVE
RBC, UA: NEGATIVE
Specific Gravity, UA: >1.03 — ABNORMAL HIGH (ref 1.005–1.030)
Urobilinogen, Ur: 0.2 mg/dL (ref 0.2–1.0)
pH, UA: 5.5 (ref 5.0–7.5)

## 2021-10-16 NOTE — Progress Notes (Signed)
Urological Symptom Review  Patient is experiencing the following symptoms: Hard to postpone urination Get up at night to urinate Stream starts and stops Urinary tract infection Vaginal bleeding (female only)   Review of Systems  Gastrointestinal (upper)  : Negative for upper GI symptoms  Gastrointestinal (lower) : Negative for lower GI symptoms  Constitutional : Fatigue  Skin: Negative for skin symptoms  Eyes: Negative for eye symptoms  Ear/Nose/Throat : Negative for Ear/Nose/Throat symptoms  Hematologic/Lymphatic: Negative for Hematologic/Lymphatic symptoms  Cardiovascular : Leg swelling  Respiratory : Negative for respiratory symptoms  Endocrine: Negative for endocrine symptoms  Musculoskeletal: Back pain Joint pain  Neurological: Negative for neurological symptoms  Psychologic: Negative for psychiatric symptoms

## 2021-10-16 NOTE — Progress Notes (Signed)
Assessment: 1. Gross hematuria   2. History of UTI      Plan: Today I had a discussion with the patient regarding the findings of gross hematuria including the implications and differential diagnoses associated with it.  I also discussed recommendations for further evaluation including the rationale for upper tract imaging and cystoscopy.  I discussed the nature of these procedures including potential risk and complications.  The patient expressed an understanding of these issues. Schedule for CT hematuria protocol and cystoscopy   Chief Complaint:  Chief Complaint  Patient presents with   Hematuria     History of Present Illness:  Tammy Calderon is a 55 y.o. year old female who is seen in consultation from Kathyrn Drown, MD  for evaluation of hematuria.  She initially noted gross hematuria in early August 2022.  She was seen in urgent care.  Urinalysis showed large blood on dipstick.  Urine culture grew 40 K E. coli.  She was treated with cephalexin.  Her symptoms do not resolve.  She continued to have some intermittent hematuria, noticing blood on her toilet paper as well as in the toilet.  No dysuria or flank pain.  Dipstick UA from 09/19/2021 showed 1+ blood and moderate leukocyte esterase.  Urine culture grew <10K colonies.  She was treated with a 5-day course of Cipro.  She continues to notice blood on toilet paper and in the toilet bowl.  She does have chronic low back pain.  She has baseline urinary symptoms of frequency and nocturia x1.  She has a remote history of UTIs.  No history of kidney stones.  No history of tobacco use.    Past Medical History:  Past Medical History:  Diagnosis Date   Asthma    Bell's palsy    Fibromyalgia    Hypertension     Past Surgical History:  Past Surgical History:  Procedure Laterality Date   NASAL SEPTUM SURGERY     TUBAL LIGATION      Allergies:  Allergies  Allergen Reactions   Sulfa Antibiotics    Cymbalta [Duloxetine Hcl]      sedated   Lyrica [Pregabalin]     drowsy   Neurontin [Gabapentin]     drowsy    Family History:  No family history on file.  Social History:  Social History   Tobacco Use   Smoking status: Never   Smokeless tobacco: Never  Substance Use Topics   Alcohol use: No   Drug use: Never    Review of symptoms:  Constitutional:  Negative for unexplained weight loss, night sweats, fever, chills ENT:  Negative for nose bleeds, sinus pain, painful swallowing CV:  Negative for chest pain, shortness of breath, exercise intolerance, palpitations, loss of consciousness Resp:  Negative for cough, wheezing, shortness of breath GI:  Negative for nausea, vomiting, diarrhea, bloody stools GU:  Positives noted in HPI; otherwise negative for dysuria, urinary incontinence Neuro:  Negative for seizures, poor balance, limb weakness, slurred speech Psych:  Negative for lack of energy, depression, anxiety Endocrine:  Negative for polydipsia, polyuria, symptoms of hypoglycemia (dizziness, hunger, sweating) Hematologic:  Negative for anemia, purpura, petechia, prolonged or excessive bleeding, use of anticoagulants  Allergic:  Negative for difficulty breathing or choking as a result of exposure to anything; no shellfish allergy; no allergic response (rash/itch) to materials, foods  Physical exam: BP (!) 146/76   Pulse 77   Temp 98.8 F (37.1 C)   LMP 10/06/2011  GENERAL APPEARANCE:  Well appearing, well developed, well nourished, NAD HEENT: Atraumatic, Normocephalic, oropharynx clear. NECK: Supple without lymphadenopathy or thyromegaly. LUNGS: Clear to auscultation bilaterally. HEART: Regular Rate and Rhythm without murmurs, gallops, or rubs. ABDOMEN: Soft, non-tender, No Masses. EXTREMITIES: Moves all extremities well.  Without clubbing, cyanosis, or edema. NEUROLOGIC:  Alert and oriented x 3, normal gait, CN II-XII grossly intact.  MENTAL STATUS:  Appropriate. BACK:  Non-tender to palpation.   No CVAT SKIN:  Warm, dry and intact.    Results: U/A dipstick:  negative

## 2021-10-22 ENCOUNTER — Other Ambulatory Visit (HOSPITAL_COMMUNITY): Payer: Self-pay | Admitting: Family Medicine

## 2021-10-22 ENCOUNTER — Ambulatory Visit (HOSPITAL_COMMUNITY)
Admission: RE | Admit: 2021-10-22 | Discharge: 2021-10-22 | Disposition: A | Discharge status: Home / Self Care | Payer: Self-pay | Source: Ambulatory Visit | Attending: Family Medicine | Admitting: Family Medicine

## 2021-10-22 ENCOUNTER — Encounter (HOSPITAL_COMMUNITY): Payer: Self-pay

## 2021-10-22 ENCOUNTER — Other Ambulatory Visit: Payer: Self-pay

## 2021-10-22 DIAGNOSIS — R928 Other abnormal and inconclusive findings on diagnostic imaging of breast: Secondary | ICD-10-CM

## 2021-10-22 MED ORDER — LIDOCAINE HCL (PF) 2 % IJ SOLN
INTRAMUSCULAR | Status: AC
  Administered 2021-10-22: 20 mL
  Filled 2021-10-22: qty 10

## 2021-10-22 MED ORDER — LIDOCAINE-EPINEPHRINE (PF) 1 %-1:200000 IJ SOLN
INTRAMUSCULAR | Status: AC
  Administered 2021-10-22: 30 mL
  Filled 2021-10-22: qty 30

## 2021-10-22 NOTE — Progress Notes (Signed)
PT tolerated left breast biopsies well today with NAD noted. PT verbalized understanding of discharge instructions. Specimens taken to lab at this time by ultrasound tech. PT ambulated back to the mammogram area this time and given ice packs.

## 2021-10-23 LAB — SURGICAL PATHOLOGY

## 2021-10-25 ENCOUNTER — Other Ambulatory Visit: Payer: Self-pay | Admitting: Family Medicine

## 2021-10-28 ENCOUNTER — Other Ambulatory Visit (HOSPITAL_COMMUNITY): Payer: Self-pay | Admitting: Family Medicine

## 2021-10-28 ENCOUNTER — Other Ambulatory Visit: Payer: Self-pay | Admitting: Family Medicine

## 2021-10-28 DIAGNOSIS — R928 Other abnormal and inconclusive findings on diagnostic imaging of breast: Secondary | ICD-10-CM

## 2021-10-28 DIAGNOSIS — N6489 Other specified disorders of breast: Secondary | ICD-10-CM

## 2021-11-04 ENCOUNTER — Other Ambulatory Visit: Payer: Self-pay

## 2021-11-04 ENCOUNTER — Ambulatory Visit (HOSPITAL_COMMUNITY)
Admission: RE | Admit: 2021-11-04 | Discharge: 2021-11-04 | Disposition: A | Discharge status: Home / Self Care | Payer: Self-pay | Source: Ambulatory Visit | Attending: Urology | Admitting: Urology

## 2021-11-04 DIAGNOSIS — R310 Gross hematuria: Secondary | ICD-10-CM | POA: Insufficient documentation

## 2021-11-04 LAB — POCT I-STAT CREATININE: Creatinine, Ser: 0.8 mg/dL (ref 0.44–1.00)

## 2021-11-04 MED ORDER — IOHEXOL 300 MG/ML  SOLN
100.0000 mL | Freq: Once | INTRAMUSCULAR | Status: AC | PRN
  Administered 2021-11-04: 100 mL via INTRAVENOUS

## 2021-11-07 ENCOUNTER — Ambulatory Visit (INDEPENDENT_AMBULATORY_CARE_PROVIDER_SITE_OTHER): Payer: Self-pay | Admitting: Urology

## 2021-11-07 ENCOUNTER — Encounter: Payer: Self-pay | Admitting: Urology

## 2021-11-07 ENCOUNTER — Other Ambulatory Visit: Payer: Self-pay

## 2021-11-07 VITALS — BP 133/58 | HR 85 | Temp 98.8°F

## 2021-11-07 DIAGNOSIS — R310 Gross hematuria: Secondary | ICD-10-CM

## 2021-11-07 DIAGNOSIS — Z87440 Personal history of urinary (tract) infections: Secondary | ICD-10-CM | POA: Insufficient documentation

## 2021-11-07 DIAGNOSIS — D259 Leiomyoma of uterus, unspecified: Secondary | ICD-10-CM

## 2021-11-07 DIAGNOSIS — R8290 Unspecified abnormal findings in urine: Secondary | ICD-10-CM

## 2021-11-07 NOTE — Progress Notes (Signed)
Assessment: 1. Gross hematuria   2. History of UTI   3. Uterine leiomyoma, unspecified location   4. Abnormal urine findings    Plan: I personally reviewed the patient's CT study from 11/04/2021 and agree with results as noted below.  Results discussed with the patient.  Given the abnormalities noted on urinalysis today suggesting a possible UTI, I recommended that we postpone the cystoscopy until a urine culture result is available. Urine culture sent today Return for cystoscopy in 3 weeks Referral to OB/GYN for uterine fibroids  Chief Complaint:  Chief Complaint  Patient presents with   Hematuria    History of Present Illness:  Tammy Calderon is a 55 y.o. year old female who is seen for further evaluation of gross hematuria.  She initially noted gross hematuria in early August 2022.  She was seen in urgent care.  Urinalysis showed large blood on dipstick.  Urine culture grew 40 K E. coli.  She was treated with cephalexin.  Her symptoms did not resolve.  She continued to have some intermittent hematuria, noticing blood on her toilet paper as well as in the toilet.  No dysuria or flank pain.  Dipstick UA from 09/19/2021 showed 1+ blood and moderate leukocyte esterase.  Urine culture grew <10K colonies.  She was treated with a 5-day course of Cipro.  She continued to notice blood on toilet paper and in the toilet bowl.  She does have chronic low back pain.  She has baseline urinary symptoms of frequency and nocturia x1.  She has a remote history of UTIs.  No history of kidney stones.  No history of tobacco use.   She was evaluated with CT abdomen and pelvis with and without contrast on 11/04/2021.  This study showed no hydronephrosis, no renal masses, no renal or ureteral calculi, and no evidence of obstruction. She presents today for cystoscopy.  She continues to have episodes of blood on her incontinence pads and on her toilet paper.  No flank pain.  She does have urgency, nocturia, and  occasional incontinence.  Portions of the above documentation were copied from a prior visit for review purposes only.   Past Medical History:  Past Medical History:  Diagnosis Date   Asthma    Bell's palsy    Fibromyalgia    Hypertension     Past Surgical History:  Past Surgical History:  Procedure Laterality Date   NASAL SEPTUM SURGERY     TUBAL LIGATION      Allergies:  Allergies  Allergen Reactions   Sulfa Antibiotics    Cymbalta [Duloxetine Hcl]     sedated   Lyrica [Pregabalin]     drowsy   Neurontin [Gabapentin]     drowsy    Family History:  History reviewed. No pertinent family history.  Social History:  Social History   Tobacco Use   Smoking status: Never   Smokeless tobacco: Never  Vaping Use   Vaping Use: Never used  Substance Use Topics   Alcohol use: No   Drug use: Never    ROS: Constitutional:  Negative for fever, chills, weight loss CV: Negative for chest pain, previous MI, hypertension Respiratory:  Negative for shortness of breath, wheezing, sleep apnea, frequent cough GI:  Negative for nausea, vomiting, bloody stool, GERD  Physical exam: BP (!) 133/58   Pulse 85   Temp 98.8 F (37.1 C)   LMP 10/06/2011  GENERAL APPEARANCE:  Well appearing, well developed, well nourished, NAD HEENT:  Atraumatic, normocephalic,  oropharynx clear NECK:  Supple without lymphadenopathy or thyromegaly ABDOMEN:  Soft, non-tender, no masses EXTREMITIES:  Moves all extremities well, without clubbing, cyanosis, or edema NEUROLOGIC:  Alert and oriented x 3, normal gait, CN II-XII grossly intact MENTAL STATUS:  appropriate BACK:  Non-tender to palpation, No CVAT SKIN:  Warm, dry, and intact   Results: U/A:  0-5 WBC, 11-30 RBC, many bacteria, nitrite neg   CT ABDOMEN AND PELVIS WITHOUT AND WITH CONTRAST   TECHNIQUE: Multidetector CT imaging of the abdomen and pelvis was performed following the standard protocol before and following the  bolus administration of intravenous contrast.   CONTRAST:  147mL OMNIPAQUE IOHEXOL 300 MG/ML  SOLN   COMPARISON:  None.   FINDINGS: Lower chest: No acute abnormality.   Hepatobiliary: No suspicious hepatic lesion. Cholelithiasis without findings of acute cholecystitis. No biliary ductal dilation.   Pancreas: No pancreatic ductal dilation or evidence of acute inflammation.   Spleen: Within normal limits.   Adrenals/Urinary Tract: Bilateral adrenal glands are unremarkable.   No hydronephrosis. No renal, ureteral or bladder calculi visualized. No solid enhancing renal mass.   Kidneys demonstrate symmetric enhancement excretion of contrast. No suspicious filling defect visualized within the opacified portions of the collecting systems or ureters on delayed imaging.   Evaluation of the urinary bladder is somewhat limited by urine contamination within this context there is no suspicious wall thickening or intraluminal filling defect visualized.   Stomach/Bowel: Small hiatal hernia. Stomach is otherwise unremarkable for degree of distension. No pathologic dilation of small or large bowel. The appendix and terminal ileum appear normal. Colonic diverticulosis without findings of acute diverticulitis.   Vascular/Lymphatic: Scattered aortic atherosclerosis without abdominal aortic aneurysm. No pathologically enlarged abdominal or pelvic lymph nodes.   Reproductive: Nodular enhancement along the fundal portion of the endometrium on image 56/11, nonspecific possibly reflecting a uterine fibroid. No suspicious adnexal mass.   Other: [name] of the mesentery with prominent mesenteric lymph nodes measuring up to 4 mm. Pelvic phleboliths.   Musculoskeletal: No acute osseous abnormality.   IMPRESSION: 1. No hydronephrosis. No renal, ureteral, or bladder calculi visualized. No solid enhancing renal mass. 2. Nodular enhancement along the fundal portion of the  endometrium, nonspecific possibly reflecting a uterine fibroid. Consider further evaluation with dedicated pelvic ultrasound. 3. [name] of the mesentery with prominent mesenteric lymph nodes measuring up to 4 mm. 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 4. Cholelithiasis without findings of acute cholecystitis. 5. Colonic diverticulosis without findings of acute diverticulitis. 6. Small hiatal hernia. 7. Aortic Atherosclerosis (ICD10-I70.0).     Electronically Signed   By: Dahlia Bailiff M.D.   On: 11/04/2021 14:02

## 2021-11-07 NOTE — Progress Notes (Signed)
Urological Symptom Review  Patient is experiencing the following symptoms: Hard to postpone urination Get up at night to urinate Leakage of urine Blood in urine Vaginal bleeding (female only)   Review of Systems  Gastrointestinal (upper)  : Indigestion/heartburn  Gastrointestinal (lower) : Negative for lower GI symptoms  Constitutional : Night Sweats Fatigue  Skin: Negative for skin symptoms  Eyes: Negative for eye symptoms  Ear/Nose/Throat : Negative for Ear/Nose/Throat symptoms  Hematologic/Lymphatic: Negative for Hematologic/Lymphatic symptoms  Cardiovascular : Negative for cardiovascular symptoms  Respiratory : Negative for respiratory symptoms  Endocrine: Negative for endocrine symptoms  Musculoskeletal: Back pain Joint pain  Neurological: Negative for neurological symptoms  Psychologic: Negative for psychiatric symptoms

## 2021-11-08 LAB — URINALYSIS, ROUTINE W REFLEX MICROSCOPIC
Bilirubin, UA: NEGATIVE
Glucose, UA: NEGATIVE
Ketones, UA: NEGATIVE
Nitrite, UA: NEGATIVE
Specific Gravity, UA: >1.03 — ABNORMAL HIGH (ref 1.005–1.030)
Urobilinogen, Ur: 0.2 mg/dL (ref 0.2–1.0)
pH, UA: 5.5 (ref 5.0–7.5)

## 2021-11-08 LAB — MICROSCOPIC EXAMINATION
Epithelial Cells (non renal): >10 /hpf — AB (ref 0–10)
Renal Epithel, UA: NONE SEEN /hpf

## 2021-11-09 LAB — URINE CULTURE

## 2021-11-11 ENCOUNTER — Ambulatory Visit
Admission: RE | Admit: 2021-11-11 | Discharge: 2021-11-11 | Disposition: A | Discharge status: Home / Self Care | Payer: Self-pay | Source: Ambulatory Visit | Attending: Family Medicine | Admitting: Family Medicine

## 2021-11-11 ENCOUNTER — Other Ambulatory Visit: Payer: Self-pay

## 2021-11-11 ENCOUNTER — Ambulatory Visit: Admission: RE | Admit: 2021-11-11 | Payer: Self-pay | Source: Ambulatory Visit

## 2021-11-11 ENCOUNTER — Other Ambulatory Visit (HOSPITAL_COMMUNITY): Payer: Self-pay | Admitting: Diagnostic Radiology

## 2021-11-11 DIAGNOSIS — N6489 Other specified disorders of breast: Secondary | ICD-10-CM

## 2021-11-11 DIAGNOSIS — R928 Other abnormal and inconclusive findings on diagnostic imaging of breast: Secondary | ICD-10-CM

## 2021-11-11 HISTORY — PX: BREAST BIOPSY: SHX20

## 2021-11-21 ENCOUNTER — Encounter: Payer: Self-pay | Admitting: Urology

## 2021-11-21 ENCOUNTER — Other Ambulatory Visit: Payer: Self-pay

## 2021-11-21 ENCOUNTER — Ambulatory Visit (INDEPENDENT_AMBULATORY_CARE_PROVIDER_SITE_OTHER): Payer: Self-pay | Admitting: Urology

## 2021-11-21 VITALS — BP 162/81 | HR 76

## 2021-11-21 DIAGNOSIS — Z87440 Personal history of urinary (tract) infections: Secondary | ICD-10-CM

## 2021-11-21 DIAGNOSIS — N3941 Urge incontinence: Secondary | ICD-10-CM

## 2021-11-21 DIAGNOSIS — R310 Gross hematuria: Secondary | ICD-10-CM

## 2021-11-21 DIAGNOSIS — D259 Leiomyoma of uterus, unspecified: Secondary | ICD-10-CM

## 2021-11-21 LAB — URINALYSIS, ROUTINE W REFLEX MICROSCOPIC
Bilirubin, UA: NEGATIVE
Glucose, UA: NEGATIVE
Nitrite, UA: NEGATIVE
Specific Gravity, UA: >1.03 — ABNORMAL HIGH (ref 1.005–1.030)
Urobilinogen, Ur: 0.2 mg/dL (ref 0.2–1.0)
pH, UA: 5 (ref 5.0–7.5)

## 2021-11-21 MED ORDER — MIRABEGRON ER 50 MG PO TB24: 50.0000 mg | ORAL_TABLET | Freq: Every day | ORAL | 0 refills | Status: DC

## 2021-11-21 MED ORDER — CIPROFLOXACIN HCL 500 MG PO TABS
500.0000 mg | ORAL_TABLET | Freq: Once | ORAL | Status: AC
Start: 2021-11-21 — End: 2021-11-21
  Administered 2021-11-21: 500 mg via ORAL

## 2021-11-21 NOTE — Progress Notes (Signed)
Urological Symptom Review  Patient is experiencing the following symptoms: Hard to postpone urination Get up at night to urinate Leakage of urine Blood in urine Vaginal bleeding (female only)   Review of Systems  Gastrointestinal (upper)  : Indigestion/heartburn  Gastrointestinal (lower) : Negative for lower GI symptoms  Constitutional : Fatigue  Skin: Negative for skin symptoms  Eyes: Negative for eye symptoms  Ear/Nose/Throat : Negative for Ear/Nose/Throat symptoms  Hematologic/Lymphatic: Negative for Hematologic/Lymphatic symptoms  Cardiovascular : Negative for cardiovascular symptoms  Respiratory : Negative for respiratory symptoms  Endocrine: Negative for endocrine symptoms  Musculoskeletal: Back pain  Neurological: Negative for neurological symptoms  Psychologic: Negative for psychiatric symptoms

## 2021-11-21 NOTE — Progress Notes (Signed)
Assessment: 1. Gross hematuria   2. History of UTI   3. Uterine leiomyoma, unspecified location   4. Urge incontinence     Plan: Urologic evaluation with CT imaging and cystoscopy shows no serious or life-threatening causes for her hematuria. Cipro x 1 following cystoscopy Awaiting consult OB/GYN for uterine fibroids, possible vaginal bleeding Management options for OAB/urge incontinence discussed with the patient including avoidance of dietary irritants, behavioral modification, medical therapy, neuromodulation, and chemodenervation. Trial of Myrbetriq 25 mg x 1 week then increase to 50 mg daily.  Samples given. Return to office in 6 weeks.  Chief Complaint:  Chief Complaint  Patient presents with   Hematuria    History of Present Illness:  Tammy Calderon is a 55 y.o. year old female who is seen for further evaluation of gross hematuria.  She initially noted gross hematuria in early August 2022.  She was seen in urgent care.  Urinalysis showed large blood on dipstick.  Urine culture grew 40 K E. coli.  She was treated with cephalexin.  Her symptoms did not resolve.  She continued to have some intermittent hematuria, noticing blood on her toilet paper as well as in the toilet.  No dysuria or flank pain.  Dipstick UA from 09/19/2021 showed 1+ blood and moderate leukocyte esterase.  Urine culture grew <10K colonies.  She was treated with a 5-day course of Cipro.  She continued to notice blood on toilet paper and in the toilet bowl.  She does have chronic low back pain.  She has baseline urinary symptoms of frequency and nocturia x1.  She has a remote history of UTIs.  No history of kidney stones.  No history of tobacco use.   She was evaluated with CT abdomen and pelvis with and without contrast on 11/04/2021.  This study showed no hydronephrosis, no renal masses, no renal or ureteral calculi, and no evidence of obstruction. Cystoscopy was postponed at her last visit due to a possible  UTI.  Urine culture grew 25-50 K mixed flora.  She returns today for cystoscopy. She continues to have episodes of blood on her incontinence pads and on her toilet paper.  No flank pain.  She does have symptoms of urgency, nocturia, and incontinence.  No dysuria. She is scheduled to see OB/GYN later this month.  Portions of the above documentation were copied from a prior visit for review purposes only.   Past Medical History:  Past Medical History:  Diagnosis Date   Asthma    Bell's palsy    Fibromyalgia    Hypertension     Past Surgical History:  Past Surgical History:  Procedure Laterality Date   NASAL SEPTUM SURGERY     TUBAL LIGATION      Allergies:  Allergies  Allergen Reactions   Misc. Sulfonamide Containing Compounds    Sulfa Antibiotics    Cymbalta [Duloxetine Hcl]     sedated   Lyrica [Pregabalin]     drowsy   Neurontin [Gabapentin]     drowsy    Family History:  No family history on file.  Social History:  Social History   Tobacco Use   Smoking status: Never   Smokeless tobacco: Never  Vaping Use   Vaping Use: Never used  Substance Use Topics   Alcohol use: No   Drug use: Never    ROS: Constitutional:  Negative for fever, chills, weight loss CV: Negative for chest pain, previous MI, hypertension Respiratory:  Negative for shortness of breath, wheezing,  sleep apnea, frequent cough GI:  Negative for nausea, vomiting, bloody stool, GERD  Physical exam: BP (!) 162/81   Pulse 76   LMP 10/06/2011  GENERAL APPEARANCE:  Well appearing, well developed, well nourished, NAD HEENT:  Atraumatic, normocephalic, oropharynx clear NECK:  Supple without lymphadenopathy or thyromegaly ABDOMEN:  Soft, non-tender, no masses EXTREMITIES:  Moves all extremities well, without clubbing, cyanosis, or edema NEUROLOGIC:  Alert and oriented x 3, normal gait, CN II-XII grossly intact MENTAL STATUS:  appropriate BACK:  Non-tender to palpation, No CVAT SKIN:  Warm,  dry, and intact GU: not done Urethra: well supported, no leakage with cough or valsalva Vagina: no cystocele, no obvious mucosal lesions or bleeding   Results: U/A:  3+ blood, 1+ LE  Procedure:  Flexible Cystourethroscopy  Pre-operative Diagnosis: Gross hematuria  Post-operative Diagnosis: Gross hematuria  Anesthesia:  local with lidocaine jelly  Surgical Narrative:  After appropriate informed consent was obtained, the patient was prepped and draped in the usual sterile fashion in the supine position.  He was correctly identified and the proper procedure delineated prior to proceeding.  Sterile lidocaine gel was instilled in the urethra. The flexible cystoscope was introduced without difficulty.  Findings:  Urethra: Normal  Bladder: Normal  Ureteral orifices: normal  Additional findings: None  Saline bladder wash for cytology was not  performed.    The cystoscope was then removed.  The patient tolerated the procedure well.

## 2021-11-28 ENCOUNTER — Ambulatory Visit (INDEPENDENT_AMBULATORY_CARE_PROVIDER_SITE_OTHER): Payer: Self-pay | Admitting: General Surgery

## 2021-11-28 ENCOUNTER — Other Ambulatory Visit: Payer: Self-pay

## 2021-11-28 ENCOUNTER — Encounter: Payer: Self-pay | Admitting: General Surgery

## 2021-11-28 VITALS — BP 128/78 | HR 68 | Temp 97.9°F | Resp 14 | Ht 61.0 in | Wt 251.0 lb

## 2021-11-28 DIAGNOSIS — N6092 Unspecified benign mammary dysplasia of left breast: Secondary | ICD-10-CM | POA: Insufficient documentation

## 2021-11-28 NOTE — Patient Instructions (Signed)
Lumpectomy (Excisional Breast Biopsy) A lumpectomy, sometimes called a partial mastectomy, is surgery to remove a concerning areas, or cancerous tumor or mass (the lump) from a breast. It is a form of breast-conserving or breast-preservation surgery. This means that the cancerous tissue is removed but the breast remains intact. During a lumpectomy, the portion of the breast that contains the tumor is removed. Some normal tissue around the lump may be taken out to make sure that all of the tumor has been removed. Lymph nodes under your arm may also be removed and tested to find out if the cancer has spread. Lymph nodes are part of the body's disease-fighting system (immune system) and are usually the first place where breast cancer spreads. Tell a health care provider about: Any allergies you have. All medicines you are taking, including vitamins, herbs, eye drops, creams, and over-the-counter medicines. Any problems you or family members have had with anesthetic medicines. Any blood disorders you have. Any surgeries you have had. Any medical conditions you have. Whether you are pregnant or may be pregnant. What are the risks? Generally, this is a safe procedure. However, problems may occur, including: Bleeding. Infection. Allergic reaction to medicines. Pain, swelling, weakness, or numbness in the arm on the side of your surgery. Temporary swelling. Change in the shape of the breast, particularly if a large portion is removed. Scar tissue that forms at the surgical site and feels hard to the touch. Blood clots. What happens before the procedure? Medicines Ask your health care provider about: Changing or stopping your regular medicines. This is especially important if you are taking diabetes medicines or blood thinners. Taking medicines such as aspirin and ibuprofen. These medicines can thin your blood. Do not take these medicines unless your health care provider tells you to take  them. Taking over-the-counter medicines, vitamins, herbs, and supplements. General instructions Prior to surgery, your health care provider may do a procedure to locate and mark the tumor area in your breast (localization). This will help guide your surgeon to where the incision will be made. This may be done with: Imaging, such as a mammogram, ultrasound, or MRI. Insertion of a small wire, clip, or seed, or an implant that will reflect a radar signal. You may have screening tests or exams to get baseline measurements of your arm. These can be compared to measurements done after surgery to monitor for swelling (lymphedema) that can develop after having lymph nodes removed. Ask your health care provider: How your surgery site will be marked. What steps will be taken to help prevent infection. These may include: Washing skin with a germ-killing soap. Taking antibiotic medicine. Plan to have someone take you home from the hospital or clinic. Plan to have a responsible adult care for you for at least 24 hours after you leave the hospital or clinic. This is important. What happens during the procedure?  An IV will be inserted into one of your veins. You will be given one or more of the following: A medicine to help you relax (sedative). A medicine to numb the area (local anesthetic). A medicine to make you fall asleep (general anesthetic). Your health care provider will use a kind of electric scalpel that uses heat to reduce bleeding (electrocautery knife). A curved incision that follows the natural curve of your breast will be made. This type of incision will allow for minimal scarring and better healing. The tumor will be removed along with some of the tissue around it. This will be  sent to the lab for testing. Your health care provider may also remove lymph nodes at this time if needed. If the tumor is close to the muscles over your chest, some muscle tissue may also be removed. A small drain  tube may be inserted into your breast area or armpit to collect fluid that may build up after surgery. This tube will be connected to a suction bulb on the outside of your body to remove the fluid. The incision will be closed with stitches (sutures). A bandage (dressing) may be placed over the incision. The procedure may vary among health care providers and hospitals. What happens after the procedure? Your blood pressure, heart rate, breathing rate, and blood oxygen level will be monitored until you leave the hospital or clinic. You will be given medicine for pain as needed. Your IV will be removed when you are able to eat and drink by mouth. You will be encouraged to get up and walk as soon as you can. This is important to improve blood flow and breathing. Ask for help if you feel weak or unsteady. You may have: A drain tube in place for 2-3 days to prevent a collection of blood (hematoma) from developing in the breast. You will be given instructions about caring for the drain before you go home. A pressure bandage applied for 1-2 days to prevent bleeding or swelling. Your pressure bandage may look like a thick piece of fabric or an elastic wrap. Ask your health care provider how to care for your bandage at home. You may be given a tight sleeve to wear over your arm on the side of your surgery. You should wear this sleeve as told by your health care provider. Do not drive for 24 hours if you were given a sedative during your procedure. Summary A lumpectomy, sometimes called a partial mastectomy, is surgery to remove a cancerous tumor or mass (the lump) from a breast. During a lumpectomy, the portion of the breast that contains the tumor is removed. Lymph nodes under your arm may also be removed and tested to find out if the cancer has spread. Plan to have someone take you home from the hospital or clinic. You may have a drain tube in place for 2-3 days to prevent a collection of blood (hematoma)  from developing in the breast. You will be given instructions about caring for the drain before you go home. This information is not intended to replace advice given to you by your health care provider. Make sure you discuss any questions you have with your health care provider. Document Revised: 06/13/2019 Document Reviewed: 06/13/2019 Elsevier Patient Education  [place].

## 2021-11-28 NOTE — Progress Notes (Signed)
[facility] History and Physical  Reason for Referral: Breast mass / Atypical lobular hyperplasia  Referring Physician: Breast center   Chief Complaint   New Patient (Initial Visit)     Tammy Calderon is a 55 y.o. female.  HPI: Tammy Calderon is a 55 yo who had her first screening mammogram and was called back for abnormalities in the left breast. She underwent 2 separate biopsies one with [place] and one stereotactic, and had 4 separate sites biopsied and marked. She has 3 separate areas of concern with atypical lobular hyperplasia. She was referred for excisional biopsy.   The patient has no history of any masses, lumps, bumps, nipple changes or discharge. She had menarche at age 62, and her first pregnancy at age 37. She is adopted but knows that her birth mother had uterine cancer.  She underwent menopause at 58.  She has never had any prior mammogram.  She has not had any chest radiation.   Past Medical History:  Diagnosis Date   Asthma    Bell's palsy    Fibromyalgia    Hypertension     Past Surgical History:  Procedure Laterality Date   NASAL SEPTUM SURGERY     TUBAL LIGATION      History reviewed. No pertinent family history.  Social History   Tobacco Use   Smoking status: Never   Smokeless tobacco: Never  Vaping Use   Vaping Use: Never used  Substance Use Topics   Alcohol use: No   Drug use: Never    Medications: I have reviewed the patient's current medications. Allergies as of 11/28/2021       Reactions   Misc. Sulfonamide Containing Compounds    Sulfa Antibiotics    Cymbalta [duloxetine Hcl]    sedated   Lyrica [pregabalin]    drowsy   Neurontin [gabapentin]    drowsy        Medication List        Accurate as of November 28, 2021 11:59 PM. If you have any questions, ask your nurse or doctor.          albuterol 108 (90 Base) MCG/ACT inhaler Commonly known as: VENTOLIN HFA Inhale 2 puffs into the lungs every 6 (six) hours as needed  for wheezing.   furosemide 20 MG tablet Commonly known as: LASIX 1 every morning as needed significant swelling not for frequent use   HYDROcodone-acetaminophen 5-325 MG tablet Commonly known as: NORCO/VICODIN TAKE ONE TABLET BY MOUTH EVERY 4 HOURS AS NEEDED FOR SEVERE PAIN. USE SPARINGLY.   lisinopril-hydrochlorothiazide 20-12.5 MG tablet Commonly known as: Zestoretic Take 1 tablet by mouth daily.   mirabegron ER 50 MG Tb24 tablet Commonly known as: MYRBETRIQ Take 1 tablet (50 mg total) by mouth daily.   simvastatin 20 MG tablet Commonly known as: ZOCOR Take 1 tablet (20 mg total) by mouth at bedtime.         ROS:  A comprehensive review of systems was negative except for: Genitourinary: positive for frequency Musculoskeletal: positive for bone pain, neck pain, and joint pain Neurological: positive for numbness  Blood pressure 128/78, pulse 68, temperature 97.9 F (36.6 C), temperature source Other (Comment), resp. rate 14, height $RemoveBe'5\' 1"'OQRBgQsdz$  (1.549 m), weight 251 lb (113.9 kg), last menstrual period 10/06/2011, SpO2 99 %. Physical Exam Vitals reviewed.  Constitutional:      Appearance: Normal appearance.  HENT:     Head: Normocephalic.     Nose: Nose normal.     Mouth/Throat:  Mouth: Mucous membranes are moist.  Eyes:     Extraocular Movements: Extraocular movements intact.  Cardiovascular:     Rate and Rhythm: Normal rate and regular rhythm.  Pulmonary:     Effort: Pulmonary effort is normal.     Breath sounds: Normal breath sounds.  Chest:  Breasts:    Right: No inverted nipple, mass, nipple discharge, skin change or tenderness.     Left: No inverted nipple, mass, nipple discharge, skin change or tenderness.  Abdominal:     General: There is no distension.     Palpations: Abdomen is soft.     Tenderness: There is no abdominal tenderness.  Musculoskeletal:        General: Normal range of motion.     Cervical back: Normal range of motion.  Lymphadenopathy:      Upper Body:     Right upper body: No supraclavicular or axillary adenopathy.     Left upper body: No supraclavicular or axillary adenopathy.  Skin:    General: Skin is warm.  Neurological:     General: No focal deficit present.     Mental Status: She is alert.  Psychiatric:        Mood and Affect: Mood normal.        Behavior: Behavior normal.    Results: Screening Recall  CLINICAL DATA:  Screening recall for left breast asymmetry and left breast masses.   EXAM: DIGITAL DIAGNOSTIC UNILATERAL LEFT MAMMOGRAM WITH TOMOSYNTHESIS AND CAD; ULTRASOUND LEFT BREAST LIMITED   TECHNIQUE: Left digital diagnostic mammography and breast tomosynthesis was performed. The images were evaluated with computer-aided detection.; Targeted ultrasound examination of the left breast was performed.   COMPARISON:  Screening mammogram dated 09/19/2021.   ACR Breast Density Category c: The breast tissue is heterogeneously dense, which may obscure small masses.   FINDINGS: Additional tomograms were performed of the left breast. There is an oval circumscribed mass in the slightly inner left breast measuring 0.7 cm. There is an oval mass in the retroareolar left breast measuring 0.7 cm. The initially questioned asymmetry in the left breast is felt to resolve on the additional imaging with findings suggestive of an area of overlapping fibroglandular tissue.   Targeted ultrasound of the left breast was performed. There is an oval mass with margin irregularity in the left breast at 9 o'clock 2 cm from nipple measuring 0.9 x 0.5 x 0.7 cm. This corresponds well with the mass seen in the inner left breast at mammography. There is a mass with margin irregularity in the left breast at 12 o'clock retroareolar measuring 0.8 x 0.5 x 1.1 cm. This corresponds well with the additional mass seen in the retroareolar left breast at mammography. There is an additional irregular mass at 12:30 position retroareolar  measuring 0.5 x 0.3 x 0.5 cm.   Targeted ultrasound of the superior left breast was performed demonstrating dense fibroglandular tissue. This dense fibroglandular tissue is felt to correspond the initially questioned asymmetry seen in the superior left breast at mammography. No lymphadenopathy seen in the left axilla.   IMPRESSION: 1. Indeterminate 0.9 cm mass in the left breast at the 9 o'clock position.   2. Indeterminate 1.1 cm mass in the left breast at 12 o'clock retroareolar.   3. Indeterminate 0.5 cm mass in the left breast at 12:30 retroareolar.   4.  Probably benign left breast asymmetry.   RECOMMENDATION: 1. Recommend ultrasound-guided biopsy of the masses in the left breast at the 9 o'clock, 12 retroareolar o'clock  and 12:30 retroareolar positions.   2. Recommend short-term follow-up diagnostic mammography of the left breast for the probably benign asymmetry. If pathology from any the above biopsies returns as malignancy, then stereotactic guided biopsy of the asymmetry in the upper central left breast would be recommended.   I have discussed the findings and recommendations with the patient. If applicable, a reminder letter will be sent to the patient regarding the next appointment.   BI-RADS CATEGORY  4: Suspicious.     Electronically Signed   By: Everlean Alstrom M.D.   On: 10/15/2021 16:15  US Guided biopsy   CLINICAL DATA:  Patient presents for ultrasound-guided biopsies of masses in the left breast at the 12:30 position, 12 o'clock position and 9 o'clock positions.   EXAM: ULTRASOUND GUIDED LEFT BREAST CORE NEEDLE BIOPSY   COMPARISON:  Previous exam(s).   PROCEDURE: I met with the patient and we discussed the procedure of ultrasound-guided biopsy, including benefits and alternatives. We discussed the high likelihood of a successful procedure. We discussed the risks of the procedure, including infection, bleeding, tissue injury, clip migration,  and inadequate sampling. Informed written consent was given. The usual time-out protocol was performed immediately prior to the procedure.   SITE 1: LEFT BREAST 12:30 O'CLOCK MASS: Lesion quadrant: UPPER OUTER   Using sterile technique and 1% Lidocaine as local anesthetic, under direct ultrasound visualization, a 14 gauge spring-loaded device was used to perform biopsy of the mass in the left breast at the 12:30 position using a medial to lateral approach. At the conclusion of the procedure a ribbon shaped tissue marker clip was deployed into the biopsy cavity. Follow up 2 view mammogram was performed and dictated separately.   SITE 2: LEFT BREAST 12 O'CLOCK MASS: Lesion quadrant: UPPER OUTER   Using sterile technique and 1% Lidocaine as local anesthetic, under direct ultrasound visualization, a 14 gauge spring-loaded device was used to perform biopsy of the mass in the left breast at the 12 o'clock position using a medial to lateral approach. At the conclusion of the procedure a wing shaped tissue marker clip was deployed into the biopsy cavity. Follow up 2 view mammogram was performed and dictated separately.   SITE 3: LEFT BREAST 9 O'CLOCK MASS: Lesion quadrant: UPPER INNER   Using sterile technique and 1% Lidocaine as local anesthetic, under direct ultrasound visualization, a 14 gauge spring-loaded device was used to perform biopsy of the mass in the left breast at the 9 o'clock position using a medial to lateral approach. At the conclusion of the procedure a HydroMARK tissue marker clip was deployed into the biopsy cavity. Follow up 2 view mammogram was performed and dictated separately.   IMPRESSION: 1. Ultrasound-guided biopsy of the mass in the left breast at the 12:30 position, at site of ribbon shaped biopsy marking clip.   2. Ultrasound-guided biopsy of the mass in the left breast at the 12 o'clock position, at site of wing shaped biopsy marking clip.   3.  Ultrasound-guided biopsy of the mass in the left breast at the 9 o'clock position, at site of [facility] biopsy marking clip.   Electronically Signed: By: Everlean Alstrom M.D. On: 10/22/2021 13:57   FINAL MICROSCOPIC DIAGNOSIS:   A. BREAST, LEFT, 12:30 MASS, BIOPSY:  - Atypical lobular hyperplasia, see comment  - Fibrocystic change   B. BREAST, LEFT, 12:00 MASS, BIOPSY:  - Atypical lobular hyperplasia, see comment  - Fibrocystic change   C. BREAST, LEFT, 9:00 MASS, BIOPSY:  - Fibroadenoma  ADDENDUM REPORT: 10/23/2021 15:50   ADDENDUM: PATHOLOGY revealed: Site A. BREAST, LEFT, 12:30 MASS, BIOPSY: - Atypical lobular hyperplasia. - Fibrocystic change.   Pathology results are CONCORDANT with imaging findings, per Dr. Everlean Alstrom.   PATHOLOGY revealed: Site B. BREAST, LEFT, 12:00 MASS, BIOPSY: - Atypical lobular hyperplasia - Fibrocystic change.   Pathology results are CONCORDANT with imaging findings, per Dr. Everlean Alstrom.   PATHOLOGY revealed: Site C. BREAST, LEFT, 9:00 MASS, BIOPSY: - Fibroadenoma   Pathology results are CONCORDANT with imaging findings, per Dr. Everlean Alstrom.   Pathology results and recommendations below were discussed with patient by telephone on 10/23/2021. Patient reported biopsy site within normal limits with slight tenderness at the site. Post biopsy care instructions were reviewed, questions were answered and my direct phone number was provided to patient. Patient was instructed to call [facility] Mammography Department if any concerns or questions arise related to the biopsy.   RECOMMENDATIONS: 1. Please schedule patient for LEFT breast stereotactic guided biopsy of asymmetry in the upper central left breast.   2. Surgical consultation for consideration of excision for sites A and B only. Request for surgical consultation was relayed to Kathi Der RT at [facility] Mammography Department  by Electa Sniff RN on 10/23/2021.   3. Initiate High Risk Screening protocol given biopsy findings of "Atypical Lobular Hyperplasia." Given dense fibroglandular tissue and 2 biopsy proven sites of atypical lobular hyperplasia in the left breast, breast MRI is recommended.   Pathology results reported by Electa Sniff RN on 10/23/2021.     Electronically Signed   By: Everlean Alstrom M.D.   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 This was found to be concordant by Dr. Lajean Manes, with excision recommended.   Pathology results were discussed with the patient by telephone. The patient reported doing well after the biopsy with tenderness at the site. Post biopsy instructions and care were reviewed and questions were answered. The patient was encouraged to call [facility] for any additional concerns. My direct phone number was provided.   Surgical consultation has been arranged with Dr. Curlene Labrum at [facility] in [place], [place] on November 28, 2021.   Note: The patient recently underwent ultrasound-guided core needle biopsy of 3 small LEFT breast masses, 2 of which demonstrated atypical lobular hyperplasia and 1 a benign fibroadenoma. Refer to biopsy report dated 10/22/21 in [facility] for further details.   Pathology results reported by Terie Purser, RN on 11/13/2021.     Electronically Signed   By: Lajean Manes M.D.   On: 11/13/2021 12:51  CLINICAL DATA:  Patient presents for stereotactic core needle biopsy of an area of asymmetry in the left breast. She was recently underwent ultrasound-guided core needle biopsy of 3 small left breast masses, 2 of which demonstrated atypical lobular hyperplasia, 1 a  benign fibroadenoma.   EXAM: LEFT BREAST STEREOTACTIC CORE NEEDLE BIOPSY   COMPARISON:  Previous exams.   FINDINGS: The patient and I discussed the procedure of stereotactic-guided biopsy including benefits and alternatives. We discussed the high likelihood of a successful procedure. We discussed the risks of the procedure including infection, bleeding, tissue injury, clip migration, and inadequate sampling. Informed written consent was given. The usual time out protocol was performed immediately prior to the  procedure.   Using sterile technique and 1% Lidocaine as local anesthetic, under stereotactic guidance, a 9 gauge vacuum assisted device was used to perform core needle biopsy of the asymmetry in the upper left breast using a superior approach.   Lesion quadrant: Upper outer quadrant: Near 12 o'clock.   At the conclusion of the procedure, an X shaped tissue marker clip was deployed into the biopsy cavity. Follow-up 2-view mammogram was performed and dictated separately.   IMPRESSION: Stereotactic-guided biopsy of a left breast asymmetry. No apparent complications.   Electronically Signed: By: Lajean Manes M.D. On: 11/11/2021 11:33 Assessment & Plan:  Tammy Calderon is a 55 y.o. female with 3 areas of atypical lobular hyperplasia that need to be excised. On reviewing the images the US guided 1230 oclock (ribbon) and 12 oclock (wing) look superficial and the stereotactic 12 oclock (X) is deeper as would be expected. I am going to discuss with Dr. Shelly Bombard breast radiologist about best way to tag this area so all can be excised. . When I looked it looks like yours are more superficial and Tammy Calderon is deeper 12 o'clock.  Patient will likely get her tag at the end of December and plan for surgery in January. Discussed that we will be doing an excision and we could find cancer and need more surgery. Discussed risk of bleeding, infection, and needing additional treatment.   All  questions were answered to the satisfaction of the patient and family.   Tammy Calderon 11/28/2021

## 2021-12-02 ENCOUNTER — Other Ambulatory Visit (HOSPITAL_COMMUNITY): Payer: Self-pay | Admitting: General Surgery

## 2021-12-02 ENCOUNTER — Encounter: Payer: Self-pay | Admitting: Obstetrics & Gynecology

## 2021-12-02 DIAGNOSIS — R928 Other abnormal and inconclusive findings on diagnostic imaging of breast: Secondary | ICD-10-CM

## 2021-12-02 NOTE — H&P (Signed)
[facility] History and Physical   Reason for Referral: Breast mass / Atypical lobular hyperplasia  Referring Physician: Breast center    Chief Complaint   New Patient (Initial Visit)        Tammy Calderon is a 55 y.o. female.  HPI: Tammy Calderon is a 55 yo who had her first screening mammogram and was called back for abnormalities in the left breast. She underwent 2 separate biopsies one with [place] and one stereotactic, and had 4 separate sites biopsied and marked. She has 3 separate areas of concern with atypical lobular hyperplasia. She was referred for excisional biopsy.    The patient has no history of any masses, lumps, bumps, nipple changes or discharge. She had menarche at age 6, and her first pregnancy at age 68. She is adopted but knows that her birth mother had uterine cancer.  She underwent menopause at 68.  She has never had any prior mammogram.  She has not had any chest radiation.         Past Medical History:  Diagnosis Date   Asthma     Bell's palsy     Fibromyalgia     Hypertension             Past Surgical History:  Procedure Laterality Date   NASAL SEPTUM SURGERY       TUBAL LIGATION          History reviewed. No pertinent family history.   Social History        Tobacco Use   Smoking status: Never   Smokeless tobacco: Never  Vaping Use   Vaping Use: Never used  Substance Use Topics   Alcohol use: No   Drug use: Never      Medications: I have reviewed the patient's current medications. Allergies as of 11/28/2021         Reactions    Misc. Sulfonamide Containing Compounds      Sulfa Antibiotics      Cymbalta [duloxetine Hcl]      sedated    Lyrica [pregabalin]      drowsy    Neurontin [gabapentin]      drowsy            Medication List           Accurate as of November 28, 2021 11:59 PM. If you have any questions, ask your nurse or doctor.              albuterol 108 (90 Base) MCG/ACT inhaler Commonly known as: VENTOLIN  HFA Inhale 2 puffs into the lungs every 6 (six) hours as needed for wheezing.    furosemide 20 MG tablet Commonly known as: LASIX 1 every morning as needed significant swelling not for frequent use    HYDROcodone-acetaminophen 5-325 MG tablet Commonly known as: NORCO/VICODIN TAKE ONE TABLET BY MOUTH EVERY 4 HOURS AS NEEDED FOR SEVERE PAIN. USE SPARINGLY.    lisinopril-hydrochlorothiazide 20-12.5 MG tablet Commonly known as: Zestoretic Take 1 tablet by mouth daily.    mirabegron ER 50 MG Tb24 tablet Commonly known as: MYRBETRIQ Take 1 tablet (50 mg total) by mouth daily.    simvastatin 20 MG tablet Commonly known as: ZOCOR Take 1 tablet (20 mg total) by mouth at bedtime.               ROS:  A comprehensive review of systems was negative except for: Genitourinary: positive for frequency Musculoskeletal: positive for bone pain, neck pain, and joint  pain Neurological: positive for numbness   Blood pressure 128/78, pulse 68, temperature 97.9 F (36.6 C), temperature source Other (Comment), resp. rate 14, height 5' 1" (1.549 m), weight 251 lb (113.9 kg), last menstrual period 10/06/2011, SpO2 99 %. Physical Exam Vitals reviewed.  Constitutional:      Appearance: Normal appearance.  HENT:     Head: Normocephalic.     Nose: Nose normal.     Mouth/Throat:     Mouth: Mucous membranes are moist.  Eyes:     Extraocular Movements: Extraocular movements intact.  Cardiovascular:     Rate and Rhythm: Normal rate and regular rhythm.  Pulmonary:     Effort: Pulmonary effort is normal.     Breath sounds: Normal breath sounds.  Chest:  Breasts:    Right: No inverted nipple, mass, nipple discharge, skin change or tenderness.     Left: No inverted nipple, mass, nipple discharge, skin change or tenderness.  Abdominal:     General: There is no distension.     Palpations: Abdomen is soft.     Tenderness: There is no abdominal tenderness.  Musculoskeletal:        General: Normal  range of motion.     Cervical back: Normal range of motion.  Lymphadenopathy:     Upper Body:     Right upper body: No supraclavicular or axillary adenopathy.     Left upper body: No supraclavicular or axillary adenopathy.  Skin:    General: Skin is warm.  Neurological:     General: No focal deficit present.     Mental Status: She is alert.  Psychiatric:        Mood and Affect: Mood normal.        Behavior: Behavior normal.      Results: Screening Recall  CLINICAL DATA:  Screening recall for left breast asymmetry and left breast masses.   EXAM: DIGITAL DIAGNOSTIC UNILATERAL LEFT MAMMOGRAM WITH TOMOSYNTHESIS AND CAD; ULTRASOUND LEFT BREAST LIMITED   TECHNIQUE: Left digital diagnostic mammography and breast tomosynthesis was performed. The images were evaluated with computer-aided detection.; Targeted ultrasound examination of the left breast was performed.   COMPARISON:  Screening mammogram dated 09/19/2021.   ACR Breast Density Category c: The breast tissue is heterogeneously dense, which may obscure small masses.   FINDINGS: Additional tomograms were performed of the left breast. There is an oval circumscribed mass in the slightly inner left breast measuring 0.7 cm. There is an oval mass in the retroareolar left breast measuring 0.7 cm. The initially questioned asymmetry in the left breast is felt to resolve on the additional imaging with findings suggestive of an area of overlapping fibroglandular tissue.   Targeted ultrasound of the left breast was performed. There is an oval mass with margin irregularity in the left breast at 9 o'clock 2 cm from nipple measuring 0.9 x 0.5 x 0.7 cm. This corresponds well with the mass seen in the inner left breast at mammography. There is a mass with margin irregularity in the left breast at 12 o'clock retroareolar measuring 0.8 x 0.5 x 1.1 cm. This corresponds well with the additional mass seen in the retroareolar left breast  at mammography. There is an additional irregular mass at 12:30 position retroareolar measuring 0.5 x 0.3 x 0.5 cm.   Targeted ultrasound of the superior left breast was performed demonstrating dense fibroglandular tissue. This dense fibroglandular tissue is felt to correspond the initially questioned asymmetry seen in the superior left breast at mammography. No lymphadenopathy  seen in the left axilla.   IMPRESSION: 1. Indeterminate 0.9 cm mass in the left breast at the 9 o'clock position.   2. Indeterminate 1.1 cm mass in the left breast at 12 o'clock retroareolar.   3. Indeterminate 0.5 cm mass in the left breast at 12:30 retroareolar.   4.  Probably benign left breast asymmetry.   RECOMMENDATION: 1. Recommend ultrasound-guided biopsy of the masses in the left breast at the 9 o'clock, 12 retroareolar o'clock and 12:30 retroareolar positions.   2. Recommend short-term follow-up diagnostic mammography of the left breast for the probably benign asymmetry. If pathology from any the above biopsies returns as malignancy, then stereotactic guided biopsy of the asymmetry in the upper central left breast would be recommended.   I have discussed the findings and recommendations with the patient. If applicable, a reminder letter will be sent to the patient regarding the next appointment.   BI-RADS CATEGORY  4: Suspicious.     Electronically Signed   By: Everlean Alstrom M.D.   On: 10/15/2021 16:15   US Guided biopsy   CLINICAL DATA:  Patient presents for ultrasound-guided biopsies of masses in the left breast at the 12:30 position, 12 o'clock position and 9 o'clock positions.   EXAM: ULTRASOUND GUIDED LEFT BREAST CORE NEEDLE BIOPSY   COMPARISON:  Previous exam(s).   PROCEDURE: I met with the patient and we discussed the procedure of ultrasound-guided biopsy, including benefits and alternatives. We discussed the high likelihood of a successful procedure. We discussed  the risks of the procedure, including infection, bleeding, tissue injury, clip migration, and inadequate sampling. Informed written consent was given. The usual time-out protocol was performed immediately prior to the procedure.   SITE 1: LEFT BREAST 12:30 O'CLOCK MASS: Lesion quadrant: UPPER OUTER   Using sterile technique and 1% Lidocaine as local anesthetic, under direct ultrasound visualization, a 14 gauge spring-loaded device was used to perform biopsy of the mass in the left breast at the 12:30 position using a medial to lateral approach. At the conclusion of the procedure a ribbon shaped tissue marker clip was deployed into the biopsy cavity. Follow up 2 view mammogram was performed and dictated separately.   SITE 2: LEFT BREAST 12 O'CLOCK MASS: Lesion quadrant: UPPER OUTER   Using sterile technique and 1% Lidocaine as local anesthetic, under direct ultrasound visualization, a 14 gauge spring-loaded device was used to perform biopsy of the mass in the left breast at the 12 o'clock position using a medial to lateral approach. At the conclusion of the procedure a wing shaped tissue marker clip was deployed into the biopsy cavity. Follow up 2 view mammogram was performed and dictated separately.   SITE 3: LEFT BREAST 9 O'CLOCK MASS: Lesion quadrant: UPPER INNER   Using sterile technique and 1% Lidocaine as local anesthetic, under direct ultrasound visualization, a 14 gauge spring-loaded device was used to perform biopsy of the mass in the left breast at the 9 o'clock position using a medial to lateral approach. At the conclusion of the procedure a HydroMARK tissue marker clip was deployed into the biopsy cavity. Follow up 2 view mammogram was performed and dictated separately.   IMPRESSION: 1. Ultrasound-guided biopsy of the mass in the left breast at the 12:30 position, at site of ribbon shaped biopsy marking clip.   2. Ultrasound-guided biopsy of the mass in the left  breast at the 12 o'clock position, at site of wing shaped biopsy marking clip.   3. Ultrasound-guided biopsy of the mass  in the left breast at the 9 o'clock position, at site of [facility] biopsy marking clip.   Electronically Signed: By: Everlean Alstrom M.D. On: 10/22/2021 13:57   FINAL MICROSCOPIC DIAGNOSIS:   A. BREAST, LEFT, 12:30 MASS, BIOPSY:  - Atypical lobular hyperplasia, see comment  - Fibrocystic change   B. BREAST, LEFT, 12:00 MASS, BIOPSY:  - Atypical lobular hyperplasia, see comment  - Fibrocystic change   C. BREAST, LEFT, 9:00 MASS, BIOPSY:  - Fibroadenoma    ADDENDUM REPORT: 10/23/2021 15:50   ADDENDUM: PATHOLOGY revealed: Site A. BREAST, LEFT, 12:30 MASS, BIOPSY: - Atypical lobular hyperplasia. - Fibrocystic change.   Pathology results are CONCORDANT with imaging findings, per Dr. Everlean Alstrom.   PATHOLOGY revealed: Site B. BREAST, LEFT, 12:00 MASS, BIOPSY: - Atypical lobular hyperplasia - Fibrocystic change.   Pathology results are CONCORDANT with imaging findings, per Dr. Everlean Alstrom.   PATHOLOGY revealed: Site C. BREAST, LEFT, 9:00 MASS, BIOPSY: - Fibroadenoma   Pathology results are CONCORDANT with imaging findings, per Dr. Everlean Alstrom.   Pathology results and recommendations below were discussed with patient by telephone on 10/23/2021. Patient reported biopsy site within normal limits with slight tenderness at the site. Post biopsy care instructions were reviewed, questions were answered and my direct phone number was provided to patient. Patient was instructed to call [facility] Mammography Department if any concerns or questions arise related to the biopsy.   RECOMMENDATIONS: 1. Please schedule patient for LEFT breast stereotactic guided biopsy of asymmetry in the upper central left breast.   2. Surgical consultation for consideration of excision for sites A and B only. Request for surgical consultation  was relayed to Kathi Der RT at [facility] Mammography Department by Electa Sniff RN on 10/23/2021.   3. Initiate High Risk Screening protocol given biopsy findings of "Atypical Lobular Hyperplasia." Given dense fibroglandular tissue and 2 biopsy proven sites of atypical lobular hyperplasia in the left breast, breast MRI is recommended.   Pathology results reported by Electa Sniff RN on 10/23/2021.     Electronically Signed   By: Everlean Alstrom M.D.   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 This was found to be concordant by Dr. Lajean Manes, with excision recommended.   Pathology results were discussed with the patient by telephone. The patient reported doing well after the biopsy with tenderness at the site. Post biopsy instructions and care were reviewed and questions were answered. The patient was encouraged to call [facility] for any additional concerns. My direct phone number was provided.   Surgical consultation has been arranged with Dr. Curlene Labrum at [facility] in [place], [place] on November 28, 2021.   Note: The patient recently underwent ultrasound-guided core needle biopsy of 3 small LEFT breast masses, 2 of which demonstrated atypical lobular hyperplasia and 1 a benign fibroadenoma. Refer to biopsy report dated 10/22/21 in [facility] for further details.   Pathology results reported by Terie Purser, RN on 11/13/2021.     Electronically Signed   By: Lajean Manes M.D.   On: 11/13/2021 12:51   CLINICAL DATA:  Patient presents for stereotactic core needle biopsy of an area of asymmetry in the left breast. She was recently underwent ultrasound-guided core needle biopsy of 3  small left  breast masses, 2 of which demonstrated atypical lobular hyperplasia, 1 a benign fibroadenoma.   EXAM: LEFT BREAST STEREOTACTIC CORE NEEDLE BIOPSY   COMPARISON:  Previous exams.   FINDINGS: The patient and I discussed the procedure of stereotactic-guided biopsy including benefits and alternatives. We discussed the high likelihood of a successful procedure. We discussed the risks of the procedure including infection, bleeding, tissue injury, clip migration, and inadequate sampling. Informed written consent was given. The usual time out protocol was performed immediately prior to the procedure.   Using sterile technique and 1% Lidocaine as local anesthetic, under stereotactic guidance, a 9 gauge vacuum assisted device was used to perform core needle biopsy of the asymmetry in the upper left breast using a superior approach.   Lesion quadrant: Upper outer quadrant: Near 12 o'clock.   At the conclusion of the procedure, an X shaped tissue marker clip was deployed into the biopsy cavity. Follow-up 2-view mammogram was performed and dictated separately.   IMPRESSION: Stereotactic-guided biopsy of a left breast asymmetry. No apparent complications.   Electronically Signed: By: Lajean Manes M.D. On: 11/11/2021 11:33 Assessment & Plan:  VIETTA BONIFIELD is a 55 y.o. female with 3 areas of atypical lobular hyperplasia that need to be excised. On reviewing the images the US guided 1230 oclock (ribbon) and 12 oclock (wing) look superficial and the stereotactic 12 oclock (X) is deeper as would be expected. I am going to discuss with Dr. Shelly Bombard breast radiologist about best way to tag this area so all can be excised. . When I looked it looks like yours are more superficial and [place]'s is deeper 12 o'clock.   Patient will likely get her tag at the end of December and plan for surgery in January. Discussed that we will be doing an excision and we could find cancer and need more  surgery. Discussed risk of bleeding, infection, and needing additional treatment.    All questions were answered to the satisfaction of the patient and family.   Future Appointments  Date Time Provider [place]  12/24/2021  9:00 AM AP-MM DIAG AP-MM [place] [facility]  12/25/2021  9:00 AM AP-DOIBP PAT 1 AP-DOIBP None  01/02/2022  2:45 PM Stoneking, Reece Leader., MD AUR-AUR None      Virl Cagey 11/28/2021

## 2021-12-17 ENCOUNTER — Other Ambulatory Visit (HOSPITAL_COMMUNITY): Payer: Self-pay

## 2021-12-20 NOTE — Patient Instructions (Signed)
Tammy Calderon  12/20/2021     @PREFPERIOPPHARMACY @   Your procedure is scheduled on  12/27/2021.   Report to [facility] at  0730  A.M.   Call this number if you have problems the morning of surgery:  [phone]   Remember:  Do not eat or drink after midnight.      Use your inhaler before you come and bring your rescue inhaler with you.     Take these medicines the morning of surgery with A SIP OF WATER                                  Hydrocodone(if needed).     Do not wear jewelry, make-up or nail polish.  Do not wear lotions, powders, or perfumes, or deodorant.  Do not shave 48 hours prior to surgery.  Men may shave face and neck.  Do not bring valuables to the hospital.  [facility] is not responsible for any belongings or valuables.  Contacts, dentures or bridgework may not be worn into surgery.  Leave your suitcase in the car.  After surgery it may be brought to your room.  For patients admitted to the hospital, discharge time will be determined by your treatment team.  Patients discharged the day of surgery will not be allowed to drive home and must have someone with them for 24 hours.    Special instructions:   DO NOT smoke tobacco or vape for 24 hours before your procedure.  Please read over the following fact sheets that you were given. Coughing and Deep Breathing, Surgical Site Infection Prevention, Anesthesia Post-op Instructions, and Care and Recovery After Surgery      Breast Biopsy, Care After These instructions give you information about caring for yourself after your procedure. Your doctor may also give you more specific instructions. Call your doctor if you have any problems or questions after your procedure. What can I expect after the procedure? After your procedure, it is common to have: Bruising on your breast. Numbness, tingling, or pain near your biopsy site. Follow these instructions at home: Medicines Take over-the-counter  and prescription medicines only as told by your doctor. Do not drive for 24 hours if you were given a medicine to help you relax (sedative) during your procedure. Do not drink alcohol while taking pain medicine. Do not drive or use heavy machinery while taking prescription pain medicine. Biopsy site care   Follow instructions from your doctor about how to take care of your cut from surgery (incision) or your puncture area. Make sure you: Wash your hands with soap and water before you change your bandage (dressing). If you cannot use soap and water, use hand sanitizer. Change your bandage as told by your doctor. Leave stitches (sutures), skin glue, or skin tape (adhesive strips) in place. They may need to stay in place for 2 weeks or longer. If tape strips get loose and curl up, you may trim the loose edges. Do not remove tape strips completely unless your doctor says it is okay. If you have stitches, keep them dry when you take a bath or a shower. Check your cut or puncture area every day for signs of infection. Check for: Redness, swelling, or pain. Fluid or blood. Warmth. Pus or a bad smell. Protect the biopsy area. Do not let the area get bumped. Activity If you had  a cut during your procedure, avoid activities that could pull your cut open. These include: Stretching. Reaching over your head. Exercise. Sports. Lifting anything that weighs more than 3 lb (1.4 kg). Return to your normal activities as told by your doctor. Ask your doctor what activities are safe for you. Managing pain, stiffness, and swelling If told, put ice on the biopsy site to relieve swelling: Put ice in a plastic bag. Place a towel between your skin and the bag. Leave the ice on for 20 minutes, 2-3 times a day. General instructions Continue your normal diet. Wear a good support bra for as long as told by your doctor. Get checked for extra fluid around your lymph nodes (lymphedema) as often as told by your  doctor. Keep all follow-up visits as told by your doctor. This is important. Contact a doctor if: You notice any of the following at the biopsy site: More redness, swelling, or pain. More fluid or blood coming from the site. The site feels warm to the touch. Pus or a bad smell coming from the site. The site breaks open after the stitches or skin tape strips have been removed. You have a rash. You have a fever. Get help right away if: You have more bleeding from the biopsy site. Get help right away if bleeding is more than a small spot. You have trouble breathing. You have red streaks around the biopsy site. Summary After your procedure, it is common to have bruising, numbness, tingling, or pain near the biopsy site. Do not drive or use heavy machinery while taking prescription pain medicine. Wear a good support bra for as long as told by your doctor. If you had a cut during your procedure, avoid activities that may pull the cut open. Ask your doctor what activities are safe for you. This information is not intended to replace advice given to you by your health care provider. Make sure you discuss any questions you have with your health care provider. Document Revised: 08/20/2020 Document Reviewed: 05/27/2018 Elsevier Patient Education  2022 [place] Anesthesia, Adult, Care After This sheet gives you information about how to care for yourself after your procedure. Your health care provider may also give you more specific instructions. If you have problems or questions, contact your health care provider. What can I expect after the procedure? After the procedure, the following side effects are common: Pain or discomfort at the IV site. Nausea. Vomiting. Sore throat. Trouble concentrating. Feeling cold or chills. Feeling weak or tired. Sleepiness and fatigue. Soreness and body aches. These side effects can affect parts of the body that were not involved in  surgery. Follow these instructions at home: For the time period you were told by your health care provider:  Rest. Do not participate in activities where you could fall or become injured. Do not drive or use machinery. Do not drink alcohol. Do not take sleeping pills or medicines that cause drowsiness. Do not make important decisions or sign legal documents. Do not take care of children on your own. Eating and drinking Follow any instructions from your health care provider about eating or drinking restrictions. When you feel hungry, start by eating small amounts of foods that are soft and easy to digest (bland), such as toast. Gradually return to your regular diet. Drink enough fluid to keep your urine pale yellow. If you vomit, rehydrate by drinking water, juice, or clear broth. General instructions If you have sleep apnea, surgery and certain medicines can  increase your risk for breathing problems. Follow instructions from your health care provider about wearing your sleep device: Anytime you are sleeping, including during daytime naps. While taking prescription pain medicines, sleeping medicines, or medicines that make you drowsy. Have a responsible adult stay with you for the time you are told. It is important to have someone help care for you until you are awake and alert. Return to your normal activities as told by your health care provider. Ask your health care provider what activities are safe for you. Take over-the-counter and prescription medicines only as told by your health care provider. If you smoke, do not smoke without supervision. Keep all follow-up visits as told by your health care provider. This is important. Contact a health care provider if: You have nausea or vomiting that does not get better with medicine. You cannot eat or drink without vomiting. You have pain that does not get better with medicine. You are unable to pass urine. You develop a skin rash. You  have a fever. You have redness around your IV site that gets worse. Get help right away if: You have difficulty breathing. You have chest pain. You have blood in your urine or stool, or you vomit blood. Summary After the procedure, it is common to have a sore throat or nausea. It is also common to feel tired. Have a responsible adult stay with you for the time you are told. It is important to have someone help care for you until you are awake and alert. When you feel hungry, start by eating small amounts of foods that are soft and easy to digest (bland), such as toast. Gradually return to your regular diet. Drink enough fluid to keep your urine pale yellow. Return to your normal activities as told by your health care provider. Ask your health care provider what activities are safe for you. This information is not intended to replace advice given to you by your health care provider. Make sure you discuss any questions you have with your health care provider. Document Revised: 08/23/2020 Document Reviewed: 03/22/2020 Elsevier Patient Education  2022 [place]. How to Use Chlorhexidine for Bathing Chlorhexidine gluconate (CHG) is a germ-killing (antiseptic) solution that is used to clean the skin. It can get rid of the bacteria that normally live on the skin and can keep them away for about 24 hours. To clean your skin with CHG, you may be given: A CHG solution to use in the shower or as part of a sponge bath. A prepackaged cloth that contains CHG. Cleaning your skin with CHG may help lower the risk for infection: While you are staying in the intensive care unit of the hospital. If you have a vascular access, such as a central line, to provide short-term or long-term access to your veins. If you have a catheter to drain urine from your bladder. If you are on a ventilator. A ventilator is a machine that helps you breathe by moving air in and out of your lungs. After surgery. What are the  risks? Risks of using CHG include: A skin reaction. Hearing loss, if CHG gets in your ears and you have a perforated eardrum. Eye injury, if CHG gets in your eyes and is not rinsed out. The CHG product catching fire. Make sure that you avoid smoking and flames after applying CHG to your skin. Do not use CHG: If you have a chlorhexidine allergy or have previously reacted to chlorhexidine. On babies younger than 2 months  of age. How to use CHG solution Use CHG only as told by your health care provider, and follow the instructions on the label. Use the full amount of CHG as directed. Usually, this is one bottle. During a shower Follow these steps when using CHG solution during a shower (unless your health care provider gives you different instructions): Start the shower. Use your normal soap and shampoo to wash your face and hair. Turn off the shower or move out of the shower stream. Pour the CHG onto a clean washcloth. Do not use any type of brush or rough-edged sponge. Starting at your neck, lather your body down to your toes. Make sure you follow these instructions: If you will be having surgery, pay special attention to the part of your body where you will be having surgery. Scrub this area for at least 1 minute. Do not use CHG on your head or face. If the solution gets into your ears or eyes, rinse them well with water. Avoid your genital area. Avoid any areas of skin that have broken skin, cuts, or scrapes. Scrub your back and under your arms. Make sure to wash skin folds. Let the lather sit on your skin for 1-2 minutes or as long as told by your health care provider. Thoroughly rinse your entire body in the shower. Make sure that all body creases and crevices are rinsed well. Dry off with a clean towel. Do not put any substances on your body afterward--such as powder, lotion, or perfume--unless you are told to do so by your health care provider. Only use lotions that are recommended by  the manufacturer. Put on clean clothes or pajamas. If it is the night before your surgery, sleep in clean sheets.  During a sponge bath Follow these steps when using CHG solution during a sponge bath (unless your health care provider gives you different instructions): Use your normal soap and shampoo to wash your face and hair. Pour the CHG onto a clean washcloth. Starting at your neck, lather your body down to your toes. Make sure you follow these instructions: If you will be having surgery, pay special attention to the part of your body where you will be having surgery. Scrub this area for at least 1 minute. Do not use CHG on your head or face. If the solution gets into your ears or eyes, rinse them well with water. Avoid your genital area. Avoid any areas of skin that have broken skin, cuts, or scrapes. Scrub your back and under your arms. Make sure to wash skin folds. Let the lather sit on your skin for 1-2 minutes or as long as told by your health care provider. Using a different clean, wet washcloth, thoroughly rinse your entire body. Make sure that all body creases and crevices are rinsed well. Dry off with a clean towel. Do not put any substances on your body afterward--such as powder, lotion, or perfume--unless you are told to do so by your health care provider. Only use lotions that are recommended by the manufacturer. Put on clean clothes or pajamas. If it is the night before your surgery, sleep in clean sheets. How to use CHG prepackaged cloths Only use CHG cloths as told by your health care provider, and follow the instructions on the label. Use the CHG cloth on clean, dry skin. Do not use the CHG cloth on your head or face unless your health care provider tells you to. When washing with the CHG cloth: Avoid your genital  area. Avoid any areas of skin that have broken skin, cuts, or scrapes. Before surgery Follow these steps when using a CHG cloth to clean before surgery  (unless your health care provider gives you different instructions): Using the CHG cloth, vigorously scrub the part of your body where you will be having surgery. Scrub using a back-and-forth motion for 3 minutes. The area on your body should be completely wet with CHG when you are done scrubbing. Do not rinse. Discard the cloth and let the area air-dry. Do not put any substances on the area afterward, such as powder, lotion, or perfume. Put on clean clothes or pajamas. If it is the night before your surgery, sleep in clean sheets.  For general bathing Follow these steps when using CHG cloths for general bathing (unless your health care provider gives you different instructions). Use a separate CHG cloth for each area of your body. Make sure you wash between any folds of skin and between your fingers and toes. Wash your body in the following order, switching to a new cloth after each step: The front of your neck, shoulders, and chest. Both of your arms, under your arms, and your hands. Your stomach and groin area, avoiding the genitals. Your right leg and foot. Your left leg and foot. The back of your neck, your back, and your buttocks. Do not rinse. Discard the cloth and let the area air-dry. Do not put any substances on your body afterward--such as powder, lotion, or perfume--unless you are told to do so by your health care provider. Only use lotions that are recommended by the manufacturer. Put on clean clothes or pajamas. Contact a health care provider if: Your skin gets irritated after scrubbing. You have questions about using your solution or cloth. You swallow any chlorhexidine. Call your local poison control center (1-223-598-6644 in the [place] Get help right away if: Your eyes itch badly, or they become very red or swollen. Your skin itches badly and is red or swollen. Your hearing changes. You have trouble seeing. You have swelling or tingling in your mouth or throat. You have  trouble breathing. These symptoms may represent a serious problem that is an emergency. Do not wait to see if the symptoms will go away. Get medical help right away. Call your local emergency services (911 in the [place] Do not drive yourself to the hospital. Summary Chlorhexidine gluconate (CHG) is a germ-killing (antiseptic) solution that is used to clean the skin. Cleaning your skin with CHG may help to lower your risk for infection. You may be given CHG to use for bathing. It may be in a bottle or in a prepackaged cloth to use on your skin. Carefully follow your health care provider's instructions and the instructions on the product label. Do not use CHG if you have a chlorhexidine allergy. Contact your health care provider if your skin gets irritated after scrubbing. This information is not intended to replace advice given to you by your health care provider. Make sure you discuss any questions you have with your health care provider. Document Revised: 02/18/2021 Document Reviewed: 02/18/2021 Elsevier Patient Education  2022 [employer].

## 2021-12-24 ENCOUNTER — Other Ambulatory Visit (HOSPITAL_COMMUNITY): Payer: Self-pay | Admitting: General Surgery

## 2021-12-24 ENCOUNTER — Other Ambulatory Visit: Payer: Self-pay

## 2021-12-24 ENCOUNTER — Ambulatory Visit (HOSPITAL_COMMUNITY)
Admission: RE | Admit: 2021-12-24 | Discharge: 2021-12-24 | Disposition: A | Discharge status: Home / Self Care | Payer: 59 | Source: Ambulatory Visit | Attending: General Surgery | Admitting: General Surgery

## 2021-12-24 DIAGNOSIS — R928 Other abnormal and inconclusive findings on diagnostic imaging of breast: Secondary | ICD-10-CM | POA: Insufficient documentation

## 2021-12-24 MED ORDER — LIDOCAINE HCL (PF) 2 % IJ SOLN
INTRAMUSCULAR | Status: AC
  Filled 2021-12-24: qty 20

## 2021-12-25 ENCOUNTER — Encounter (HOSPITAL_COMMUNITY): Payer: Self-pay

## 2021-12-25 ENCOUNTER — Encounter (HOSPITAL_COMMUNITY)
Admission: RE | Admit: 2021-12-25 | Discharge: 2021-12-25 | Disposition: A | Discharge status: Home / Self Care | Payer: 59 | Source: Ambulatory Visit | Attending: General Surgery | Admitting: General Surgery

## 2021-12-25 VITALS — BP 122/67 | HR 72 | Temp 98.6°F | Resp 18 | Ht 61.0 in | Wt 250.0 lb

## 2021-12-25 DIAGNOSIS — Z79899 Other long term (current) drug therapy: Secondary | ICD-10-CM | POA: Diagnosis not present

## 2021-12-25 DIAGNOSIS — Z01818 Encounter for other preprocedural examination: Secondary | ICD-10-CM | POA: Insufficient documentation

## 2021-12-25 HISTORY — DX: Other specified postprocedural states: Z98.890

## 2021-12-25 HISTORY — DX: Other specified postprocedural states: R11.2

## 2021-12-25 LAB — BASIC METABOLIC PANEL
Anion gap: 9 (ref 5–15)
BUN: 16 mg/dL (ref 6–20)
CO2: 27 mmol/L (ref 22–32)
Calcium: 9.3 mg/dL (ref 8.9–10.3)
Chloride: 102 mmol/L (ref 98–111)
Creatinine, Ser: 0.71 mg/dL (ref 0.44–1.00)
GFR, Estimated: >60 mL/min (ref >60)
Glucose, Bld: 103 mg/dL — ABNORMAL HIGH (ref 70–99)
Potassium: 4.1 mmol/L (ref 3.5–5.1)
Sodium: 138 mmol/L (ref 135–145)

## 2021-12-25 MED ORDER — FENTANYL CITRATE (PF) 250 MCG/5ML IJ SOLN
INTRAMUSCULAR | Status: AC
  Filled 2021-12-25: qty 5

## 2021-12-25 MED ORDER — SUCCINYLCHOLINE CHLORIDE 200 MG/10ML IV SOSY
PREFILLED_SYRINGE | INTRAVENOUS | Status: AC
  Filled 2021-12-25: qty 10

## 2021-12-25 MED ORDER — MIDAZOLAM HCL 2 MG/2ML IJ SOLN
INTRAMUSCULAR | Status: AC
  Filled 2021-12-25: qty 2

## 2021-12-27 ENCOUNTER — Ambulatory Visit (HOSPITAL_COMMUNITY)
Admission: RE | Admit: 2021-12-27 | Discharge: 2021-12-27 | Disposition: A | Discharge status: Home / Self Care | Payer: 59 | Source: Ambulatory Visit | Attending: General Surgery | Admitting: General Surgery

## 2021-12-27 ENCOUNTER — Other Ambulatory Visit (HOSPITAL_COMMUNITY): Payer: Self-pay | Admitting: General Surgery

## 2021-12-27 ENCOUNTER — Ambulatory Visit (HOSPITAL_COMMUNITY): Payer: 59 | Admitting: Anesthesiology

## 2021-12-27 ENCOUNTER — Ambulatory Visit (HOSPITAL_COMMUNITY): Payer: 59

## 2021-12-27 ENCOUNTER — Ambulatory Visit (HOSPITAL_COMMUNITY)
Admission: RE | Admit: 2021-12-27 | Discharge: 2021-12-27 | Disposition: A | Discharge status: Home / Self Care | Payer: 59 | Attending: General Surgery | Admitting: General Surgery

## 2021-12-27 ENCOUNTER — Other Ambulatory Visit: Payer: Self-pay

## 2021-12-27 ENCOUNTER — Encounter (HOSPITAL_COMMUNITY): Payer: Self-pay | Admitting: General Surgery

## 2021-12-27 ENCOUNTER — Encounter (HOSPITAL_COMMUNITY)
Admission: RE | Disposition: A | Discharge status: Home / Self Care | Payer: Self-pay | Source: Home / Self Care | Attending: General Surgery

## 2021-12-27 DIAGNOSIS — Z8049 Family history of malignant neoplasm of other genital organs: Secondary | ICD-10-CM | POA: Insufficient documentation

## 2021-12-27 DIAGNOSIS — N6320 Unspecified lump in the left breast, unspecified quadrant: Secondary | ICD-10-CM

## 2021-12-27 DIAGNOSIS — N6082 Other benign mammary dysplasias of left breast: Secondary | ICD-10-CM

## 2021-12-27 DIAGNOSIS — N6092 Unspecified benign mammary dysplasia of left breast: Secondary | ICD-10-CM | POA: Diagnosis present

## 2021-12-27 DIAGNOSIS — D0512 Intraductal carcinoma in situ of left breast: Secondary | ICD-10-CM | POA: Diagnosis not present

## 2021-12-27 DIAGNOSIS — N6489 Other specified disorders of breast: Secondary | ICD-10-CM | POA: Insufficient documentation

## 2021-12-27 HISTORY — PX: BREAST BIOPSY WITH RADIO FREQUENCY LOCALIZER: SHX6895

## 2021-12-27 HISTORY — PX: BREAST LUMPECTOMY: SHX2

## 2021-12-27 SURGERY — BREAST BIOPSY WITH RADIO FREQUENCY LOCALIZER
Anesthesia: General | Site: Breast | Laterality: Left

## 2021-12-27 MED ORDER — CHLORHEXIDINE GLUCONATE 0.12 % MT SOLN
15.0000 mL | Freq: Once | OROMUCOSAL | Status: AC
  Administered 2021-12-27: 15 mL via OROMUCOSAL

## 2021-12-27 MED ORDER — ONDANSETRON HCL 4 MG/2ML IJ SOLN
INTRAMUSCULAR | Status: DC | PRN
Start: 2021-12-27 — End: 2021-12-27
  Administered 2021-12-27: 4 mg via INTRAVENOUS

## 2021-12-27 MED ORDER — CEFAZOLIN SODIUM-DEXTROSE 2-4 GM/100ML-% IV SOLN
2.0000 g | INTRAVENOUS | Status: AC
  Administered 2021-12-27: 2 g via INTRAVENOUS

## 2021-12-27 MED ORDER — CHLORHEXIDINE GLUCONATE CLOTH 2 % EX PADS: 6.0000 | MEDICATED_PAD | Freq: Once | CUTANEOUS | Status: DC

## 2021-12-27 MED ORDER — BUPIVACAINE HCL (PF) 0.5 % IJ SOLN
INTRAMUSCULAR | Status: AC
  Filled 2021-12-27: qty 30

## 2021-12-27 MED ORDER — DEXAMETHASONE SODIUM PHOSPHATE 10 MG/ML IJ SOLN
INTRAMUSCULAR | Status: DC | PRN
  Administered 2021-12-27: 10 mg via INTRAVENOUS

## 2021-12-27 MED ORDER — ONDANSETRON HCL 4 MG PO TABS: 4.0000 mg | ORAL_TABLET | Freq: Three times a day (TID) | ORAL | 1 refills | Status: DC | PRN

## 2021-12-27 MED ORDER — CEFAZOLIN SODIUM-DEXTROSE 2-4 GM/100ML-% IV SOLN
INTRAVENOUS | Status: AC
  Filled 2021-12-27: qty 100

## 2021-12-27 MED ORDER — MIDAZOLAM HCL 2 MG/2ML IJ SOLN
INTRAMUSCULAR | Status: DC | PRN
  Administered 2021-12-27: 2 mg via INTRAVENOUS

## 2021-12-27 MED ORDER — PROPOFOL 10 MG/ML IV BOLUS
INTRAVENOUS | Status: AC
  Filled 2021-12-27: qty 20

## 2021-12-27 MED ORDER — EPHEDRINE SULFATE 50 MG/ML IJ SOLN
INTRAMUSCULAR | Status: DC | PRN
  Administered 2021-12-27: 5 mg via INTRAVENOUS
  Administered 2021-12-27 (×2): 10 mg via INTRAVENOUS

## 2021-12-27 MED ORDER — OXYCODONE HCL 5 MG PO TABS: 5.0000 mg | ORAL_TABLET | ORAL | 0 refills | Status: DC | PRN

## 2021-12-27 MED ORDER — ONDANSETRON HCL 4 MG/2ML IJ SOLN
4.0000 mg | Freq: Once | INTRAMUSCULAR | Status: AC | PRN
  Administered 2021-12-27: 4 mg via INTRAVENOUS
  Filled 2021-12-27: qty 2

## 2021-12-27 MED ORDER — PROPOFOL 10 MG/ML IV BOLUS
INTRAVENOUS | Status: DC | PRN
  Administered 2021-12-27: 150 mg via INTRAVENOUS
  Administered 2021-12-27: 100 mg via INTRAVENOUS

## 2021-12-27 MED ORDER — SODIUM CHLORIDE 0.9 % IR SOLN
Status: DC | PRN
  Administered 2021-12-27: 1000 mL

## 2021-12-27 MED ORDER — FENTANYL CITRATE (PF) 100 MCG/2ML IJ SOLN
INTRAMUSCULAR | Status: AC
  Filled 2021-12-27: qty 2

## 2021-12-27 MED ORDER — FENTANYL CITRATE PF 50 MCG/ML IJ SOSY
25.0000 ug | PREFILLED_SYRINGE | INTRAMUSCULAR | Status: DC | PRN
  Administered 2021-12-27 (×3): 50 ug via INTRAVENOUS
  Filled 2021-12-27 (×3): qty 1

## 2021-12-27 MED ORDER — FENTANYL CITRATE (PF) 100 MCG/2ML IJ SOLN
INTRAMUSCULAR | Status: DC | PRN
  Administered 2021-12-27: 25 ug via INTRAVENOUS
  Administered 2021-12-27: 50 ug via INTRAVENOUS
  Administered 2021-12-27: 25 ug via INTRAVENOUS
  Administered 2021-12-27: 50 ug via INTRAVENOUS
  Administered 2021-12-27 (×2): 25 ug via INTRAVENOUS

## 2021-12-27 MED ORDER — ORAL CARE MOUTH RINSE: 15.0000 mL | Freq: Once | OROMUCOSAL | Status: AC

## 2021-12-27 MED ORDER — BUPIVACAINE HCL (PF) 0.5 % IJ SOLN
INTRAMUSCULAR | Status: DC | PRN
  Administered 2021-12-27: 30 mL

## 2021-12-27 MED ORDER — LACTATED RINGERS IV SOLN: INTRAVENOUS | Status: DC

## 2021-12-27 MED ORDER — LIDOCAINE HCL (PF) 2 % IJ SOLN
INTRAMUSCULAR | Status: AC
  Filled 2021-12-27: qty 5

## 2021-12-27 MED ORDER — OXYCODONE-ACETAMINOPHEN 5-325 MG PO TABS
ORAL_TABLET | ORAL | Status: AC
  Filled 2021-12-27: qty 1

## 2021-12-27 MED ORDER — OXYCODONE-ACETAMINOPHEN 5-325 MG PO TABS
1.0000 | ORAL_TABLET | Freq: Once | ORAL | Status: AC
  Administered 2021-12-27: 1 via ORAL

## 2021-12-27 MED ORDER — MIDAZOLAM HCL 2 MG/2ML IJ SOLN
INTRAMUSCULAR | Status: AC
  Filled 2021-12-27: qty 2

## 2021-12-27 MED ORDER — EPHEDRINE 5 MG/ML INJ
INTRAVENOUS | Status: AC
  Filled 2021-12-27: qty 5

## 2021-12-27 SURGICAL SUPPLY — 31 items
BLADE SURG 15 STRL LF DISP TIS (BLADE) ×1 IMPLANT
BLADE SURG 15 STRL SS (BLADE) ×2
CHLORAPREP W/TINT 26 (MISCELLANEOUS) ×2 IMPLANT
CLOTH BEACON ORANGE TIMEOUT ST (SAFETY) ×2 IMPLANT
COVER LIGHT HANDLE STERIS (MISCELLANEOUS) ×4 IMPLANT
DERMABOND ADVANCED (GAUZE/BANDAGES/DRESSINGS) ×1
DERMABOND ADVANCED .7 DNX12 (GAUZE/BANDAGES/DRESSINGS) IMPLANT
DEVICE DUBIN SPECIMEN MAMMOGRA (MISCELLANEOUS) ×2 IMPLANT
ELECT REM PT RETURN 9FT ADLT (ELECTROSURGICAL) ×2
ELECTRODE REM PT RTRN 9FT ADLT (ELECTROSURGICAL) ×1 IMPLANT
GAUZE 4X4 16PLY ~~LOC~~+RFID DBL (SPONGE) ×2 IMPLANT
GLOVE SURG ENC MOIS LTX SZ6.5 (GLOVE) ×4 IMPLANT
GLOVE SURG UNDER POLY LF SZ7 (GLOVE) ×6 IMPLANT
GOWN STRL REUS W/TWL LRG LVL3 (GOWN DISPOSABLE) ×6 IMPLANT
KIT MARKER MARGIN INK (KITS) ×1 IMPLANT
KIT TURNOVER KIT A (KITS) ×2 IMPLANT
MANIFOLD NEPTUNE II (INSTRUMENTS) ×2 IMPLANT
NDL HYPO 18GX1.5 BLUNT FILL (NEEDLE) ×1 IMPLANT
NDL HYPO 25X1 1.5 SAFETY (NEEDLE) ×1 IMPLANT
NEEDLE HYPO 18GX1.5 BLUNT FILL (NEEDLE) ×2 IMPLANT
NEEDLE HYPO 25X1 1.5 SAFETY (NEEDLE) ×2 IMPLANT
NS IRRIG 1000ML POUR BTL (IV SOLUTION) ×2 IMPLANT
PACK MINOR (CUSTOM PROCEDURE TRAY) ×2 IMPLANT
PAD ARMBOARD 7.5X6 YLW CONV (MISCELLANEOUS) ×2 IMPLANT
SET BASIN LINEN APH (SET/KITS/TRAYS/PACK) ×2 IMPLANT
SET LOCALIZER 20 PROBE US (MISCELLANEOUS) ×2 IMPLANT
SUT MNCRL AB 4-0 PS2 18 (SUTURE) ×3 IMPLANT
SUT SILK 2 0 SH (SUTURE) ×2 IMPLANT
SUT VIC AB 3-0 SH 27 (SUTURE) ×4
SUT VIC AB 3-0 SH 27X BRD (SUTURE) ×1 IMPLANT
SYR CONTROL 10ML LL (SYRINGE) ×2 IMPLANT

## 2021-12-27 NOTE — Anesthesia Postprocedure Evaluation (Signed)
Anesthesia Post Note  Patient: Tammy Calderon  Procedure(s) Performed: EXCISIONAL BREAST BIOPSY WITH RADIO FREQUENCY LOCALIZER X2 (Left: Breast)  Patient location during evaluation: Phase II Anesthesia Type: General Level of consciousness: awake Pain management: pain level controlled Vital Signs Assessment: post-procedure vital signs reviewed and stable Respiratory status: spontaneous breathing and respiratory function stable Cardiovascular status: blood pressure returned to baseline and stable Postop Assessment: no headache and no apparent nausea or vomiting Anesthetic complications: no Comments: Late entry   No notable events documented.   Last Vitals:  Vitals:   12/27/21 1145 12/27/21 1200  BP: (!) 130/57 96/68  Pulse: 72 70  Resp: 12 (!) 7  Temp:    SpO2: 93% 92%    Last Pain:  Vitals:   12/27/21 1221  PainSc: [place]

## 2021-12-27 NOTE — Anesthesia Preprocedure Evaluation (Signed)
Anesthesia Evaluation  Patient identified by MRN, date of birth, ID band Patient awake    Reviewed: Allergy & Precautions, H&P , NPO status , Patient's Chart, lab work & pertinent test results, reviewed documented beta blocker date and time   History of Anesthesia Complications (+) PONV and history of anesthetic complications  Airway Mallampati: II  TM Distance: >3 FB Neck ROM: full    Dental no notable dental hx.    Pulmonary asthma ,    Pulmonary exam normal breath sounds clear to auscultation       Cardiovascular Exercise Tolerance: Good hypertension, negative cardio ROS   Rhythm:regular Rate:Normal     Neuro/Psych  Neuromuscular disease negative psych ROS   GI/Hepatic negative GI ROS, Neg liver ROS,   Endo/Other  negative endocrine ROS  Renal/GU negative Renal ROS  negative genitourinary   Musculoskeletal   Abdominal   Peds  Hematology negative hematology ROS (+)   Anesthesia Other Findings   Reproductive/Obstetrics negative OB ROS                             Anesthesia Physical Anesthesia Plan  ASA: 2  Anesthesia Plan: General and General LMA   Post-op Pain Management:    Induction:   PONV Risk Score and Plan: Ondansetron  Airway Management Planned:   Additional Equipment:   Intra-op Plan:   Post-operative Plan:   Informed Consent: I have reviewed the patients History and Physical, chart, labs and discussed the procedure including the risks, benefits and alternatives for the proposed anesthesia with the patient or authorized representative who has indicated his/her understanding and acceptance.     Dental Advisory Given  Plan Discussed with: CRNA  Anesthesia Plan Comments:         Anesthesia Quick Evaluation

## 2021-12-27 NOTE — Discharge Instructions (Signed)
Discharge instructions after breast surgery:   Common Complaints: Pain and bruising at the incision sites.  Swelling at the incision sites. Stiffness of the arm.  Bruising at the incisions.   Diet/ Activity: Diet as tolerated.  You may shower but do not take hot showers as this can disrupt the glue. Rest and listen to your body, but do not remain in bed all day.  Walk everyday for at least 15-20 minutes. Deep cough and move around every 1-2 hours in the first few days after surgery.  Do not lift > 10 lbs for the first 2 weeks after surgery. Do not do anything that makes you feel like you are putting unnecessary pull or stretch on the incision sites.  Wear a bra for support if you desire.  Do move your arm and shoulder. If you do not move then you can get stiff and hurt more.  Do not pick at the dermabond glue on your incision sites.  This glue film will remain in place for 1-2 weeks and will start to peel off.  Do not place lotions or balms on your incision unless instructed to specifically by Dr. Constance Haw.   Pain Expectations and Narcotics: -After surgery you will have pain associated with your incisions and this is normal. The pain is muscular and nerve pain, and will get better with time. -You are encouraged and expected to take non narcotic medications like tylenol and ibuprofen (when able) to treat pain as multiple modalities can aid with pain treatment. -Narcotics are only used when pain is severe or there is breakthrough pain. -You are not expected to have a pain score of 0 after surgery, as we cannot prevent pain. A pain score of 3-4 that allows you to be functional, move, walk, and tolerate some activity is the goal. The pain will continue to improve over the days after surgery and is dependent on your surgery. -Due to [place] law, we are only able to give a certain amount of pain medication to treat post operative pain, and we only give additional narcotics on a patient by patient basis.   -For most laparoscopic surgery, studies have shown that the majority of patients only need 10-15 narcotic pills, and for open surgeries most patients only need 15-20.   -Having appropriate expectations of pain and knowledge of pain management with non narcotics is important as we do not want anyone to become addicted to narcotic pain medication.  -Using ice packs in the first 48 hours and heating pads after 48 hours, wearing an abdominal binder (when recommended), and using over the counter medications are all ways to help with pain management.   -Simple acts like meditation and mindfulness practices after surgery can also help with pain control and research has proven the benefit of these practices.  Medication: Take tylenol and ibuprofen as needed for pain control, alternating every 4-6 hours.  Example:  Tylenol 1000mg  @ 6am, 12noon, 6pm, 79midnight (Do not exceed 4000mg  of tylenol a day). Ibuprofen 800mg  @ 9am, 3pm, 9pm, 3am (Do not exceed 3600mg  of ibuprofen a day).  Take Roxicodone for breakthrough pain every 4 hours.  Take Colace for constipation related to narcotic pain medication. If you do not have a bowel movement in 2 days, take Miralax over the counter.  Drink plenty of water to also prevent constipation.   Contact Information: If you have questions or concerns, please call our office, [phone], Monday- Thursday 8AM-5PM and Friday 8AM-12Noon.  If it is after hours or  on the weekend, please call [facility] Main Number, [phone], [phone], and ask to speak to the surgeon on call for Dr. Constance Haw at [facility].

## 2021-12-27 NOTE — Anesthesia Procedure Notes (Signed)
Procedure Name: LMA Insertion Date/Time: 12/27/2021 9:07 AM Performed by: Vista Deck, CRNA Pre-anesthesia Checklist: Patient identified, Patient being monitored, Emergency Drugs available, Timeout performed and Suction available Patient Re-evaluated:Patient Re-evaluated prior to induction Oxygen Delivery Method: Circle System Utilized Preoxygenation: Pre-oxygenation with 100% oxygen Induction Type: IV induction Ventilation: Mask ventilation without difficulty LMA: LMA inserted LMA Size: 4.0 Number of attempts: 1 Placement Confirmation: positive ETCO2 and breath sounds checked- equal and bilateral Tube secured with: Tape Dental Injury: Teeth and Oropharynx as per pre-operative assessment

## 2021-12-27 NOTE — Op Note (Addendum)
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 Constance Haw, MD   Assistants: No qualified resident was available    Anesthesia: General endotracheal   Anesthesiologist: Louann Sjogren, MD    Specimens:  1.) Left breast lower 12 o'clock (wing clip) and 12:30 (ribbon clip) tissue;  2.) Left breast superior (X shaped clip) tissue    Estimated Blood Loss: Minimal   Blood Replacement: None    Complications: None   Wound Class:Clean    Operative Indications: Ms. Macfadden is a 56 yo who has three biopsy sites that were concerning and needed further excision of these areas. Two radiofrequency tags were able to be placed to excision the three biopsy sites of concern. We dicussed excision and risk of bleeding, infection, finding something like cancer, needing more surgery or treatment.   Findings: Large fibrocystic breast    Procedure: The patient was taken to the operating room and placed supine. General endotracheal anesthesia was induced. Intravenous antibiotics were administered per protocol.  The left breast was prepared and draped in the usual sterile fashion. The mammogram images after the radiofrequency tag were reviewed and pulled up during the procedure. The inferior and more superficial site at 12 o'clock, 12:30 was noted and the more superior and deeper site was noted.   The localizer was used to identify the lower and more superficial tag was localized and an incision was made under the areola at about the 6 o'clock position on the areola. Flaps were created and the breast tissue was excised using the localizer to locate the tag during the excision. With sharp and cautery dissection, excised the area. At  one point I did have a reading of 1cm from the tag and then saw the tag. I paused and placed 3-0 silk interrupted sutures to re-approximate the tissue as the tag clearly had been closer to the dissection plane than the probe was indicating. This re-approximation allowed the tag to stay in place and I excised wider.  The specimen was painted in the standard fashion. This specimen was sent to mammogram and the two biopsy clips (wing and ribbon) and the radiofrequency tag were noted in the specimen.  The cavity was inspected and hemostasis was achieved. The area was packed.   The superior tag was then located using the localizer in about the 12 o'clock upper portion of the breast. A curvilinear incision was made, and this was carried down into the breast tissue. Flaps were made. This tag was very deep in the tissue and I was unable to use the probe and had to use the localizer itself as the tag was over 3.8 cm deep.  Given this I had to go wider to ensure that I under and around the clip and tag sufficiently.  Once I was deep and more posterior, I could use the localizer probe and was able to excise the posterior and inferior portions. The excision was done with sharp dissection and electrocautery. The specimen was painted in the standard fashion and sent to mammogram. The X shaped clip and the tag were in the specimen.   Both specimens were sent to pathology. Both cavities were hemostatic. The flaps were closed with interrupted 3-0 Vicryl and the skin was closed with 4-0 Monocryl subcuticular,  leaving the cavity open for seroma formation for cosmesis.  Dermabond was placed on the skin.   Final inspection revealed acceptable hemostasis. All counts were correct at the end of the case. The patient was awakened from anesthesia and extubated without complication.  The patient went to the PACU in stable condition.   Curlene Labrum, MD [facility] [address] [place], [place]  [address] [phone] (office)

## 2021-12-27 NOTE — Transfer of Care (Signed)
Immediate Anesthesia Transfer of Care Note  Patient: Tammy Calderon  Procedure(s) Performed: EXCISIONAL BREAST BIOPSY WITH RADIO FREQUENCY LOCALIZER X2 (Left: Breast)  Patient Location: PACU  Anesthesia Type:General  Level of Consciousness: awake and patient cooperative  Airway & Oxygen Therapy: Patient Spontanous Breathing and non-rebreather face mask  Post-op Assessment: Report given to RN and Post -op Vital signs reviewed and stable  Post vital signs: Reviewed and stable  Last Vitals:  Vitals Value Taken Time  BP 145/57 12/27/21 1112  Temp 97.6   Pulse 97 12/27/21 1114  Resp 16 12/27/21 1114  SpO2 95 % 12/27/21 1114  Vitals shown include unvalidated device data.  Last Pain:  Vitals:   12/27/21 0830  PainSc: 0-No pain         Complications: No notable events documented.

## 2021-12-27 NOTE — Interval H&P Note (Signed)
History and Physical Interval Note:  12/27/2021 8:15 AM  Tammy Calderon  has presented today for surgery, with the diagnosis of Atypical lobular hyperplasia, left breast.  The various methods of treatment have been discussed with the patient and family. After consideration of risks, benefits and other options for treatment, the patient has consented to  Procedure(s): BREAST BIOPSY WITH RADIO FREQUENCY LOCALIZER (Left) as a surgical intervention.  The patient's history has been reviewed, patient examined, no change in status, stable for surgery.  I have reviewed the patient's chart and labs.  Questions were answered to the patient's satisfaction.    Discussed with patient, 3 biopsied areas (2 close together) and 1 more superior were tagged with 2 separate tags. Will perform 2 excisions and will have two separate incisions. Discussed that her fourth site of biopsy was benign.   Virl Cagey

## 2021-12-27 NOTE — Progress Notes (Signed)
[facility]  Updated husband. Rx sent to [facility]. Can wear supportive bra if desired. Will see 1/19 and call with pathology results before then.   Curlene Labrum, MD [facility] [address] [place], [place] [address] [phone] (office)

## 2021-12-30 ENCOUNTER — Encounter (HOSPITAL_COMMUNITY): Payer: Self-pay | Admitting: General Surgery

## 2021-12-31 LAB — SURGICAL PATHOLOGY

## 2022-01-01 ENCOUNTER — Other Ambulatory Visit: Payer: Self-pay

## 2022-01-01 ENCOUNTER — Emergency Department (HOSPITAL_COMMUNITY)
Admission: EM | Admit: 2022-01-01 | Discharge: 2022-01-01 | Disposition: A | Discharge status: Home / Self Care | Payer: 59 | Attending: Emergency Medicine | Admitting: Emergency Medicine

## 2022-01-01 ENCOUNTER — Telehealth: Payer: Self-pay | Admitting: *Deleted

## 2022-01-01 ENCOUNTER — Encounter (HOSPITAL_COMMUNITY): Payer: Self-pay

## 2022-01-01 DIAGNOSIS — G8918 Other acute postprocedural pain: Secondary | ICD-10-CM | POA: Insufficient documentation

## 2022-01-01 DIAGNOSIS — S299XXA Unspecified injury of thorax, initial encounter: Secondary | ICD-10-CM | POA: Diagnosis present

## 2022-01-01 DIAGNOSIS — J45909 Unspecified asthma, uncomplicated: Secondary | ICD-10-CM | POA: Diagnosis not present

## 2022-01-01 DIAGNOSIS — Z79899 Other long term (current) drug therapy: Secondary | ICD-10-CM | POA: Diagnosis not present

## 2022-01-01 DIAGNOSIS — S2002XA Contusion of left breast, initial encounter: Secondary | ICD-10-CM | POA: Diagnosis not present

## 2022-01-01 DIAGNOSIS — X509XXA Other and unspecified overexertion or strenuous movements or postures, initial encounter: Secondary | ICD-10-CM | POA: Diagnosis not present

## 2022-01-01 DIAGNOSIS — I10 Essential (primary) hypertension: Secondary | ICD-10-CM | POA: Diagnosis not present

## 2022-01-01 LAB — CBC WITH DIFFERENTIAL/PLATELET
Abs Immature Granulocytes: 0.07 10*3/uL (ref 0.00–0.07)
Basophils Absolute: 0.1 10*3/uL (ref 0.0–0.1)
Basophils Relative: 1 %
Eosinophils Absolute: 0.9 10*3/uL — ABNORMAL HIGH (ref 0.0–0.5)
Eosinophils Relative: 10 %
HCT: 38.9 % (ref 36.0–46.0)
Hemoglobin: 12.3 g/dL (ref 12.0–15.0)
Immature Granulocytes: 1 %
Lymphocytes Relative: 17 %
Lymphs Abs: 1.5 10*3/uL (ref 0.7–4.0)
MCH: 29.1 pg (ref 26.0–34.0)
MCHC: 31.6 g/dL (ref 30.0–36.0)
MCV: 92 fL (ref 80.0–100.0)
Monocytes Absolute: 0.5 10*3/uL (ref 0.1–1.0)
Monocytes Relative: 5 %
Neutro Abs: 6.2 10*3/uL (ref 1.7–7.7)
Neutrophils Relative %: 66 %
Platelets: 248 10*3/uL (ref 150–400)
RBC: 4.23 MIL/uL (ref 3.87–5.11)
RDW: 13.2 % (ref 11.5–15.5)
WBC: 9.3 10*3/uL (ref 4.0–10.5)
nRBC: 0 % (ref 0.0–0.2)

## 2022-01-01 LAB — BASIC METABOLIC PANEL
Anion gap: 8 (ref 5–15)
BUN: 18 mg/dL (ref 6–20)
CO2: 28 mmol/L (ref 22–32)
Calcium: 9.1 mg/dL (ref 8.9–10.3)
Chloride: 102 mmol/L (ref 98–111)
Creatinine, Ser: 0.65 mg/dL (ref 0.44–1.00)
GFR, Estimated: >60 mL/min (ref >60)
Glucose, Bld: 100 mg/dL — ABNORMAL HIGH (ref 70–99)
Potassium: 3.8 mmol/L (ref 3.5–5.1)
Sodium: 138 mmol/L (ref 135–145)

## 2022-01-01 NOTE — Telephone Encounter (Signed)
Received call from patient   Surgical Date:12/27/2021. Procedure: excisional breast biopsy- L  Concerns: Patient reports incision to breast is hot to touch and red. Reports increased pain and edema to breast.  Denies fever, denies drainage from incision.   Per Dr. Constance Haw, patient to come to office for wound check. Call placed to patient and patient made aware.

## 2022-01-01 NOTE — Discharge Instructions (Signed)
Continue taking your pain medication as directed.  You may also try taking over-the-counter ibuprofen 400 to 600 mg every 6-8 hours with food.  Follow-up with Dr. Constance Haw if needed.  Return emergency department for any new or worsening symptoms.

## 2022-01-01 NOTE — ED Notes (Signed)
Pt verbalized understanding of discharge paperwork.

## 2022-01-01 NOTE — ED Provider Notes (Signed)
[facility] EMERGENCY DEPARTMENT Provider Note   CSN: [id number] Arrival date & time: 01/01/22  1617     History  Chief Complaint  Patient presents with   Incisional Pain    Tammy Calderon is a 56 y.o. female.  HPI      Tammy Calderon is a 56 y.o. female with past medical history significant for asthma, fibromyalgia, and hypertension who presents to the Emergency Department complaining of shooting pains of both breasts.  Left greater than right.  Patient had excisional biopsy of left breast on 12/27/2021 by Dr. Constance Haw.  States she woke this morning with "shooting" pains to both breast, left worse than right.  She also noticed some redness around both incisions and feels the breast is warm to touch.  She denies any drainage or opening of the incision site.  Believes she has been having chills. she also denies any fever, shortness of breath or vomiting.     Home Medications Prior to Admission medications   Medication Sig Start Date End Date Taking? Authorizing Provider  albuterol (VENTOLIN HFA) 108 (90 Base) MCG/ACT inhaler Inhale 2 puffs into the lungs every 6 (six) hours as needed for wheezing. 07/24/21   Vanessa Kick, MD  furosemide (LASIX) 20 MG tablet 1 every morning as needed significant swelling not for frequent use Patient taking differently: Take 20 mg by mouth daily as needed for fluid. 1 every morning as needed significant swelling not for frequent use 10/15/21   Kathyrn Drown, MD  ibuprofen (ADVIL) 200 MG tablet Take 200 mg by mouth every 8 (eight) hours as needed for moderate pain.    [provider]  lisinopril-hydrochlorothiazide (ZESTORETIC) 20-12.5 MG tablet Take 1 tablet by mouth daily. 09/10/21   Kathyrn Drown, MD  mirabegron ER (MYRBETRIQ) 50 MG TB24 tablet Take 1 tablet (50 mg total) by mouth daily. Patient not taking: Reported on 12/16/2021 11/21/21   Primus Bravo., MD  ondansetron (ZOFRAN) 4 MG tablet Take 1 tablet (4 mg total) by mouth every 8  (eight) hours as needed. 12/27/21 12/27/22  Virl Cagey, MD  oxyCODONE (ROXICODONE) 5 MG immediate release tablet Take 1 tablet (5 mg total) by mouth every 4 (four) hours as needed for severe pain or breakthrough pain. 12/27/21 12/27/22  Virl Cagey, MD  simvastatin (ZOCOR) 20 MG tablet Take 1 tablet (20 mg total) by mouth at bedtime. Patient not taking: Reported on 12/16/2021 09/20/21   Kathyrn Drown, MD      Allergies    Misc. sulfonamide containing compounds, Sulfa antibiotics, Cymbalta [duloxetine hcl], Lyrica [pregabalin], and Neurontin [gabapentin]    Review of Systems   Review of Systems  Constitutional:  Positive for chills. Negative for appetite change and fever.  Respiratory:  Negative for chest tightness, shortness of breath and wheezing.   Gastrointestinal:  Positive for nausea. Negative for vomiting.  Genitourinary:        Shooting pains and redness of left breast around surgical incisions.  All other systems reviewed and are negative.  Physical Exam Updated Vital Signs BP 133/67 (BP Location: Right Arm)    Pulse 76    Temp 98 F (36.7 C) (Oral)    Resp 16    Ht 5\' 1"  (1.549 m)    Wt 108.9 kg    LMP 10/06/2011    SpO2 100%    BMI 45.35 kg/m  Physical Exam Vitals and nursing note reviewed.  Constitutional:      Appearance: Normal appearance.  She is not ill-appearing or toxic-appearing.  Cardiovascular:     Rate and Rhythm: Normal rate and regular rhythm.     Pulses: Normal pulses.  Pulmonary:     Effort: Pulmonary effort is normal. No respiratory distress.  Chest:     Chest wall: No tenderness.  Breasts:    Left: Tenderness present.     Comments: Diffuse tenderness of the left breast, left breast significantly larger than right.   2 surgical incisions present 1 superior to the areola, 1 distal.  tissue adhesive in place.  Ecchymosis noted to the left breast.  No lymphangitis or excessive warmth.  No drainage noted.  See attached photo  Abdominal:      Palpations: Abdomen is soft.     Tenderness: There is no abdominal tenderness.  Skin:    General: Skin is warm.     Capillary Refill: Capillary refill takes less than 2 seconds.  Neurological:     General: No focal deficit present.     Mental Status: She is alert.     Motor: No weakness.     Patient gave verbal consent for imaging to be stored in medical record  ED Results / Procedures / Treatments   Labs (all labs ordered are listed, but only abnormal results are displayed) Labs Reviewed  CBC WITH DIFFERENTIAL/PLATELET - Abnormal; Notable for the following components:      Result Value   Eosinophils Absolute 0.9 (*)    All other components within normal limits  BASIC METABOLIC PANEL - Abnormal; Notable for the following components:   Glucose, Bld 100 (*)    All other components within normal limits    EKG None  Radiology No results found.  Procedures Procedures    Medications Ordered in ED Medications - No data to display  ED Course/ Medical Decision Making/ A&P                           Medical Decision Making  Patient here for evaluation of pain and redness to surgical incision site.  Had excisional biopsy of left breast on 12/27/2021.  Noticed symptoms this morning.  Endorses having chills but denies fever, shortness of breath abdominal pain vomiting.  On exam, there is diffuse tenderness to palpation of the breast.  Mild erythema noted superior to the areola.  Diffuse bruising present.  No drainage or lymphangitis.   Patient is well-appearing nontoxic.  Differential would include cellulitis, postop infection, abscess, postop changes  Plan will include laboratory studies and consultation with patient's surgeon.  I suspect patient's symptoms are related to postsurgical changes.  No concerning symptoms for wound infection.  I will consult general surgery, Dr. Constance Haw  Lab results interpreted by me, no leukocytosis, chemistries unremarkable.  Discussed findings  with Dr. Constance Haw and she has reviewed image stored in medical record.  No findings that are concerning for wound infection at this time.  Patient has pain medication at home, no history of GI symptoms and kidney function is unremarkable.  I have recommended patient to take ibuprofen as well for pain control.  She will follow-up with Dr. Constance Haw in clinic as needed.          Final Clinical Impression(s) / ED Diagnoses Final diagnoses:  Post-operative pain    Rx / DC Orders ED Discharge Orders     None         Kem Parkinson, PA-C 01/01/22 1839    Fredia Sorrow, MD 01/09/22  1034 ° °

## 2022-01-01 NOTE — ED Triage Notes (Signed)
Pt had surgery on her left breast on Friday to remove mass. Pt states today she started having shooting pain, redness, and warm to the touch.

## 2022-01-02 ENCOUNTER — Other Ambulatory Visit: Payer: Self-pay | Admitting: *Deleted

## 2022-01-02 ENCOUNTER — Telehealth (INDEPENDENT_AMBULATORY_CARE_PROVIDER_SITE_OTHER): Payer: 59 | Admitting: General Surgery

## 2022-01-02 ENCOUNTER — Ambulatory Visit: Payer: Self-pay | Admitting: Urology

## 2022-01-02 DIAGNOSIS — D0512 Intraductal carcinoma in situ of left breast: Secondary | ICD-10-CM

## 2022-01-02 DIAGNOSIS — N6092 Unspecified benign mammary dysplasia of left breast: Secondary | ICD-10-CM

## 2022-01-02 DIAGNOSIS — Z87440 Personal history of urinary (tract) infections: Secondary | ICD-10-CM

## 2022-01-02 DIAGNOSIS — N3941 Urge incontinence: Secondary | ICD-10-CM

## 2022-01-02 MED ORDER — OXYCODONE HCL 5 MG PO TABS: 5.0000 mg | ORAL_TABLET | ORAL | 0 refills | Status: DC | PRN

## 2022-01-02 NOTE — Progress Notes (Deleted)
Assessment: 1. History of UTI   2. Urge incontinence     Plan: Urologic evaluation with CT imaging and cystoscopy shows no serious or life-threatening causes for her hematuria. Cipro x 1 following cystoscopy Awaiting consult OB/GYN for uterine fibroids, possible vaginal bleeding Management options for OAB/urge incontinence discussed with the patient including avoidance of dietary irritants, behavioral modification, medical therapy, neuromodulation, and chemodenervation. Trial of Myrbetriq 25 mg x 1 week then increase to 50 mg daily.  Samples given. Return to office in 6 weeks.  Chief Complaint:  No chief complaint on file.   History of Present Illness:  Tammy Calderon is a 56 y.o. year old female who is seen for further evaluation of gross hematuria.  She initially noted gross hematuria in early August 2022.  She was seen in urgent care.  Urinalysis showed large blood on dipstick.  Urine culture grew 40 K E. coli.  She was treated with cephalexin.  Her symptoms did not resolve.  She continued to have some intermittent hematuria, noticing blood on her toilet paper as well as in the toilet.  No dysuria or flank pain.  Dipstick UA from 09/19/2021 showed 1+ blood and moderate leukocyte esterase.  Urine culture grew <10K colonies.  She was treated with a 5-day course of Cipro.  She continued to notice blood on toilet paper and in the toilet bowl.  She does have chronic low back pain.  She has baseline urinary symptoms of frequency and nocturia x1.  She has a remote history of UTIs.  No history of kidney stones.  No history of tobacco use.   She was evaluated with CT abdomen and pelvis with and without contrast on 11/04/2021.  This study showed no hydronephrosis, no renal masses, no renal or ureteral calculi, and no evidence of obstruction. Cystoscopy was postponed at her last visit due to a possible UTI.  Urine culture grew 25-50 K mixed flora.  She continued to have episodes of blood on her  incontinence pads and on her toilet paper.  No flank pain.  She does have symptoms of urgency, nocturia, and incontinence.  No dysuria. Cystoscopy from 12/10/2021 demonstrated no urethral or bladder abnormalities.  Pelvic exam showed adequate support of the urethra and bladder.  Portions of the above documentation were copied from a prior visit for review purposes only.   Past Medical History:  Past Medical History:  Diagnosis Date   Asthma    Bell's palsy    Fibromyalgia    Hypertension    PONV (postoperative nausea and vomiting)     Past Surgical History:  Past Surgical History:  Procedure Laterality Date   BREAST BIOPSY WITH RADIO FREQUENCY LOCALIZER Left 12/27/2021   Procedure: EXCISIONAL BREAST BIOPSY WITH RADIO FREQUENCY LOCALIZER X2;  Surgeon: Virl Cagey, MD;  Location: [facility];  Service: General;  Laterality: Left;   NASAL SEPTUM SURGERY     TUBAL LIGATION      Allergies:  Allergies  Allergen Reactions   Misc. Sulfonamide Containing Compounds Anaphylaxis   Sulfa Antibiotics Anaphylaxis   Cymbalta [Duloxetine Hcl]     sedated   Lyrica [Pregabalin]     drowsy   Neurontin [Gabapentin]     drowsy    Family History:  No family history on file.  Social History:  Social History   Tobacco Use   Smoking status: Never   Smokeless tobacco: Never  Vaping Use   Vaping Use: Never used  Substance Use Topics   Alcohol use:  No   Drug use: Never    ROS: Constitutional:  Negative for fever, chills, weight loss CV: Negative for chest pain, previous MI, hypertension Respiratory:  Negative for shortness of breath, wheezing, sleep apnea, frequent cough GI:  Negative for nausea, vomiting, bloody stool, GERD  Physical exam: LMP 10/06/2011  ***   Results: ***

## 2022-01-02 NOTE — Telephone Encounter (Signed)
[facility]  Called patient to check on patient given her concern of her breast. She was having swelling and shooting pains in the breast. She had been worried about infection and called the office. She could not make it to the office in time before it closed and went to the ED.   Told her to keep an eye on her breast, I do not think it is infected based on imaging yesterday. Told her I would refill pain medication for weekend. She can call [phone] to get me over weekend if any issues.   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  Discussed that she will need radiation. She is understanding. Will see her 1/19.  Will refer to Oncology so she can discuss any further treatment/ radiation.   Pathology: FINAL MICROSCOPIC DIAGNOSIS:   A. BREAST, LEFT, LUMPECTOMY:  -  Sclerotic fibroadenomatoid nodule  -  Columnar cell and fibrocystic changes  -  Pseudoangiomatous stromal hyperplasia  -  Previous biopsy site changes  -  No malignancy identified   B. 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  -  Lobular neoplasia (atypical lobular hyperplasia)  -  Margins uninvolved by carcinoma (0.25 cm; lateral margin)  -  Previous biopsy site changes  -  See oncology table and comment below   DCIS OF THE BREAST:  Resection   Procedure: Excision  Specimen Laterality: Left  Histologic Type: Ductal carcinoma in situ  Size of DCIS: 0.15 cm nuclear Grade: G2, intermediate grade  Necrosis: Not identified  Margins: All margins negative for DCIS   Specify Closest Margin (required only if <78mm): 2.5  Regional Lymph Nodes: Not applicable (no lymph nodes submitted or found)  Breast Biomarker Testing: Pending  Pathologic Stage Classification (pTNM, AJCC 8th Edition): pTis, pN not  assigned Representative Tumor Block: A4   Comment(s): Cytokeratin 5/6 is negative in the foci of ductal and  lobular atypia.  ER shows weak expression in the DCIS.  E-cadherin is  negative in the lobular neoplasia but positive in the ductal  hyperplasia.  Cytokeratin AE1/3 does not highlight an infiltrative  epithelial component.  Cytokeratin 5/6 is positive in background ductal  hyperplasia.  Dr. Alric Seton reviewed the case and agrees with the  above diagnosis.

## 2022-01-02 NOTE — Telephone Encounter (Signed)
Referral orders placed

## 2022-01-09 ENCOUNTER — Ambulatory Visit (INDEPENDENT_AMBULATORY_CARE_PROVIDER_SITE_OTHER): Payer: 59 | Admitting: General Surgery

## 2022-01-09 ENCOUNTER — Other Ambulatory Visit: Payer: Self-pay

## 2022-01-09 ENCOUNTER — Encounter: Payer: Self-pay | Admitting: General Surgery

## 2022-01-09 VITALS — BP 119/81 | HR 79 | Temp 97.8°F | Resp 14 | Ht 61.0 in | Wt 245.0 lb

## 2022-01-09 DIAGNOSIS — M792 Neuralgia and neuritis, unspecified: Secondary | ICD-10-CM

## 2022-01-09 DIAGNOSIS — D0512 Intraductal carcinoma in situ of left breast: Secondary | ICD-10-CM

## 2022-01-09 MED ORDER — GABAPENTIN 100 MG PO CAPS: 100.0000 mg | ORAL_CAPSULE | Freq: Three times a day (TID) | ORAL | 0 refills | Status: DC

## 2022-01-09 NOTE — Progress Notes (Signed)
[facility]  Continues ot have shooting pains in the breast and it is hypersensitive.  BP 119/81    Pulse 79    Temp 97.8 F (36.6 C) (Other (Comment))    Resp 14    Ht 5\' 1"  (1.549 m)    Wt 245 lb (111.1 kg)    LMP 10/06/2011    SpO2 96%    BMI 46.29 kg/m  Left breast incisions c/d/I with no signs of infection, glue is peeling Seroma/ swelling noted   Patient with neuropathic pain s/p left breast excisions, DCIS of the breast. Referral to Oncology already in  Gabapentin 100 mg TID to start now and will keep on for a few weeks Can be out of work for another week given her pain and discomfort Return to work 01/20/2022 (Monday) as long as feeling better   Future Appointments  Date Time Provider [place]  01/16/2022  1:30 PM Derek Jack, MD [facility] None  01/28/2022  9:00 AM Virl Cagey, MD [facility] None   Curlene Labrum, MD [facility] [address] [place], [place] [address] [phone] (office)

## 2022-01-09 NOTE — Patient Instructions (Signed)
Very low dose gabapentin should not make you drowsy.  Can take this to help with neuropathic pain.   Neuropathic Pain Neuropathic pain is pain caused by damage to the nerves that are responsible for certain sensations in your body (sensory nerves). Neuropathic pain can make you more sensitive to pain. Even a minor sensation can feel very painful. This is usually a long-term (chronic) condition that can be difficult to treat. The type of pain differs from person to person. It may: Start suddenly (acute), or it may develop slowly and become chronic. Come and go as damaged nerves heal, or it may stay at the same level for years. Cause emotional distress, loss of sleep, and a lower quality of life. What are the causes? The most common cause of this condition is diabetes. Many other diseases and conditions can also cause neuropathic pain. Causes of neuropathic pain can be classified as: Toxic. This is caused by medicines and chemicals. The most common causes of toxic neuropathic pain is damage from medicines that kill cancer cells (chemotherapy) or alcohol abuse. Metabolic. This can be caused by: Diabetes. Lack of vitamins like B12. Traumatic. Any injury that cuts, crushes, or stretches a nerve can cause damage and pain. Compression-related. If a sensory nerve gets trapped or compressed for a long period of time, the blood supply to the nerve can be cut off. Vascular. Many blood vessel diseases can cause neuropathic pain by decreasing blood supply and oxygen to nerves. Autoimmune. This type of pain results from diseases in which the body's defense system (immune system) mistakenly attacks sensory nerves. Examples of autoimmune diseases that can cause neuropathic pain include lupus and multiple sclerosis. Infectious. Many types of viral infections can damage sensory nerves and cause pain. Shingles infection is a common cause of this type of pain. Inherited. Neuropathic pain can be a symptom of many  diseases that are passed down through families (genetic). What increases the risk? You are more likely to develop this condition if: You have diabetes. You smoke. You drink too much alcohol. You are taking certain medicines, including chemotherapy or medicines that treat immune system disorders. What are the signs or symptoms? The main symptom is pain. Neuropathic pain is often described as: Burning. Shock-like. Stinging. Hot or cold. Itching. How is this diagnosed? No single test can diagnose neuropathic pain. It is diagnosed based on: A physical exam and your symptoms. Your health care provider will ask you about your pain. You may be asked to use a pain scale to describe how bad your pain is. Tests. These may be done to see if you have a cause and location of any nerve damage. They include: Nerve conduction studies and electromyography to test how well nerve signals travel through your nerves and muscles (electrodiagnostic testing). Skin biopsy to evaluate for small fiber neuropathy. Imaging studies, such as: X-rays. CT scan. MRI. How is this treated? Treatment for neuropathic pain may change over time. You may need to try different treatment options or a combination of treatments. Some options include: Treating the underlying cause of the neuropathy, such as diabetes, kidney disease, or vitamin deficiencies. Stopping medicines that can cause neuropathy, such as chemotherapy. Medicine to relieve pain. Medicines may include: Prescription or over-the-counter pain medicine. Anti-seizure medicine. Antidepressant medicines. Pain-relieving patches or creams that are applied to painful areas of skin. A medicine to numb the area (local anesthetic), which can be injected as a nerve block. Transcutaneous nerve stimulation. This uses electrical currents to block painful nerve signals.  The treatment is painless. Alternative treatments, such  as: Acupuncture. Meditation. Massage. Occupational or physical therapy. Pain management programs. Counseling. Follow these instructions at home: Medicines  Take over-the-counter and prescription medicines only as told by your health care provider. Ask your health care provider if the medicine prescribed to you: Requires you to avoid driving or using machinery. Can cause constipation. You may need to take these actions to prevent or treat constipation: Drink enough fluid to keep your urine pale yellow. Take over-the-counter or prescription medicines. Eat foods that are high in fiber, such as beans, whole grains, and fresh fruits and vegetables. Limit foods that are high in fat and processed sugars, such as fried or sweet foods. Lifestyle  Have a good support system at home. Consider joining a chronic pain support group. Do not use any products that contain nicotine or tobacco. These products include cigarettes, chewing tobacco, and vaping devices, such as e-cigarettes. If you need help quitting, ask your health care provider. Do not drink alcohol. General instructions Learn as much as you can about your condition. Work closely with all your health care providers to find the treatment plan that works best for you. Ask your health care provider what activities are safe for you. Keep all follow-up visits. This is important. Contact a health care provider if: Your pain treatments are not working. You are having side effects from your medicines. You are struggling with tiredness (fatigue), mood changes, depression, or anxiety. Get help right away if: You have thoughts of hurting yourself. Get help right away if you feel like you may hurt yourself or others, or have thoughts about taking your own life. Go to your nearest emergency room or: Call 911. Call the [facility] at [phone] or 988. This is open 24 hours a day. Text the Crisis Text Line at  325-538-1651. Summary Neuropathic pain is pain caused by damage to the nerves that are responsible for certain sensations in your body (sensory nerves). Neuropathic pain may come and go as damaged nerves heal, or it may stay at the same level for years. Neuropathic pain is usually a long-term condition that can be difficult to treat. Consider joining a chronic pain support group. This information is not intended to replace advice given to you by your health care provider. Make sure you discuss any questions you have with your health care provider. Document Revised: 08/05/2021 Document Reviewed: 08/05/2021 Elsevier Patient Education  2022 [employer].

## 2022-01-16 ENCOUNTER — Telehealth (INDEPENDENT_AMBULATORY_CARE_PROVIDER_SITE_OTHER): Payer: 59 | Admitting: *Deleted

## 2022-01-16 ENCOUNTER — Encounter: Payer: Self-pay | Admitting: General Surgery

## 2022-01-16 ENCOUNTER — Other Ambulatory Visit: Payer: Self-pay | Admitting: Family Medicine

## 2022-01-16 ENCOUNTER — Ambulatory Visit (HOSPITAL_COMMUNITY): Payer: 59 | Admitting: Hematology

## 2022-01-16 ENCOUNTER — Other Ambulatory Visit: Payer: Self-pay | Admitting: General Surgery

## 2022-01-16 DIAGNOSIS — M792 Neuralgia and neuritis, unspecified: Secondary | ICD-10-CM

## 2022-01-16 DIAGNOSIS — D0512 Intraductal carcinoma in situ of left breast: Secondary | ICD-10-CM

## 2022-01-16 MED ORDER — OXYCODONE HCL 5 MG PO TABS: 5.0000 mg | ORAL_TABLET | ORAL | 0 refills | Status: DC | PRN

## 2022-01-16 MED ORDER — OXYCODONE HCL 5 MG PO CAPS: 5.0000 mg | ORAL_CAPSULE | ORAL | 0 refills | Status: DC | PRN

## 2022-01-16 NOTE — Telephone Encounter (Signed)
Yes. Extend her work note. I have refilled the roxicodone. I will see her at her return appt. The gabapentin should be continued.

## 2022-01-16 NOTE — Telephone Encounter (Signed)
Received call from patient [phone].  Surgical Date: 12/27/2021 Procedure: L Breast Excision  Patient reports that she continues to have deep pain in L breast. Requested to extend out of work note x1 week- return to work on 01/27/2022. Ok to extend note?  Patient is also requesting refill on Oxycodone 5mg . [facility]: [facility]. Last refill given 01/02/2022 #30 tabs.

## 2022-01-16 NOTE — Progress Notes (Signed)
Roxicodone tablet not available. Changed to capsule.  Curlene Labrum, MD

## 2022-01-17 ENCOUNTER — Encounter (HOSPITAL_COMMUNITY): Payer: Self-pay

## 2022-01-17 NOTE — Telephone Encounter (Signed)
Call placed to patient and patient made aware.   Letter mailed per patient request.

## 2022-01-20 ENCOUNTER — Encounter (HOSPITAL_COMMUNITY): Payer: Self-pay | Admitting: Hematology

## 2022-01-20 ENCOUNTER — Inpatient Hospital Stay (HOSPITAL_COMMUNITY): Payer: 59 | Attending: Hematology | Admitting: Hematology

## 2022-01-20 ENCOUNTER — Telehealth: Payer: Self-pay | Admitting: Radiation Oncology

## 2022-01-20 ENCOUNTER — Other Ambulatory Visit: Payer: Self-pay

## 2022-01-20 DIAGNOSIS — D0512 Intraductal carcinoma in situ of left breast: Secondary | ICD-10-CM | POA: Insufficient documentation

## 2022-01-20 DIAGNOSIS — Z170 Estrogen receptor positive status [ER+]: Secondary | ICD-10-CM | POA: Diagnosis not present

## 2022-01-20 DIAGNOSIS — Z8542 Personal history of malignant neoplasm of other parts of uterus: Secondary | ICD-10-CM | POA: Diagnosis not present

## 2022-01-20 MED ORDER — ANASTROZOLE 1 MG PO TABS: 1.0000 mg | ORAL_TABLET | Freq: Every day | ORAL | 1 refills | Status: DC

## 2022-01-20 NOTE — Patient Instructions (Addendum)
[facility] Discharge Instructions  You were seen and examined today by Dr. Delton Coombes. Dr. Delton Coombes is a medical oncologist, meaning that he specializes in the management of cancer diagnoses. Dr. Delton Coombes discussed your past medical history, family history of cancers, and the events that led to you being here today.  You were referred to Dr. 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 These are precancerous conditions, but they do place you at higher risk for developing invasive breast cancer. This is prevented by taking Anastrozole once daily for at least 5 years. Anastrozole is an anti-estrogen agent and works by decreasing the amount of estrogen present in your body so that invasive cancer cannot be fed.  The most common side effect of Anastrozole is hot flashes. This gets better over the first few months on the medication.  Dr. Delton Coombes has recommended checking a baseline vitamin D level as well as a bone density scan. This is done because long-term Anastrozole treatment can lead to decreased bone density.  Please take Vitamin D and Calcium Supplements if you are not already.  You will also be referred to Radiation Oncology for further evaluation.  Follow-up as scheduled.   Thank you for choosing [facility] [place] at [facility] to provide your oncology and hematology care.  To afford each patient quality time with our provider, please arrive at least 15 minutes before your scheduled appointment time.   If you have a lab appointment with the [facility] please come in thru the Main Entrance and check in at the main information desk.  You need to re-schedule your appointment should you arrive 10 or more minutes late.  We strive to give you quality time with our providers, and arriving late affects you and other patients whose appointments are after yours.  Also, if  you no show three or more times for appointments you may be dismissed from the clinic at the providers discretion.     Again, thank you for choosing [facility].  Our hope is that these requests will decrease the amount of time that you wait before being seen by our physicians.       _____________________________________________________________  Should you have questions after your visit to [facility], please contact our office at [phone] and follow the prompts.  Our office hours are 8:00 a.m. and 4:30 p.m. Monday - Friday.  Please note that voicemails left after 4:00 p.m. may not be returned until the following business day.  We are closed weekends and major holidays.  You do have access to a nurse 24-7, just call the main number to the clinic [phone] and do not press any options, hold on the line and a nurse will answer the phone.    For prescription refill requests, have your pharmacy contact our office and allow 72 hours.    Due to Covid, you will need to wear a mask upon entering the hospital. If you do not have a mask, a mask will be given to you at the Main Entrance upon arrival. For doctor visits, patients may have 1 support person age 63 or older with them. For treatment visits, patients can not have anyone with them due to social distancing guidelines and our immunocompromised population.

## 2022-01-20 NOTE — Progress Notes (Signed)
[facility] [address] [place] [address]   Patient Care Team: Kathyrn Drown, MD as PCP - General (Family Medicine) Derek Jack, MD as Medical Oncologist (Medical Oncology) Brien Mates, RN as Oncology Nurse Navigator (Medical Oncology)  CHIEF COMPLAINTS/PURPOSE OF CONSULTATION:  Newly diagnosed left breast DCIS  HISTORY OF PRESENTING ILLNESS:  Tammy Calderon 56 y.o. female is here because of recent diagnosis of left breast DCIS  Today she reports feeling good. She denies previous history of breast biopsies. She has a history of fibromyalgia. She denies history of PE, DVT CVA, and MI. She reports occasional ankle swellings. She currently lives at home with her husband, and she works for the [employer]. She denies smoking history. She biological mother had uterine cancer; she was adopted and her knowledge of her family medical history is limited.   In terms of breast cancer risk profile:  She menarched at early age of 13 and went to menopause at age 38  She had 2 pregnancies, her first child was born at age 12  She had received birth control pills for approximately 3 years.  She was never exposed to fertility medications or hormone replacement therapy.  She has family history of Breast/GYN/GI cancer  I reviewed her records extensively and collaborated the history with the patient.  SUMMARY OF ONCOLOGIC HISTORY: Oncology History   No history exists.    MEDICAL HISTORY:  Past Medical History:  Diagnosis Date   Asthma    Bell's palsy    Fibromyalgia    Hypertension    PONV (postoperative nausea and vomiting)     SURGICAL HISTORY: Past Surgical History:  Procedure Laterality Date   BREAST BIOPSY WITH RADIO FREQUENCY LOCALIZER Left 12/27/2021   Procedure: EXCISIONAL BREAST BIOPSY WITH RADIO FREQUENCY LOCALIZER X2;  Surgeon: Virl Cagey, MD;  Location: [facility];  Service: General;  Laterality: Left;   NASAL SEPTUM SURGERY     TUBAL  LIGATION      SOCIAL HISTORY: Social History   Socioeconomic History   Marital status: Married    Spouse name: Not on file   Number of children: Not on file   Years of education: Not on file   Highest education level: Not on file  Occupational History   Not on file  Tobacco Use   Smoking status: Never   Smokeless tobacco: Never  Vaping Use   Vaping Use: Never used  Substance and Sexual Activity   Alcohol use: No   Drug use: Never   Sexual activity: Not on file  Other Topics Concern   Not on file  Social History Narrative   Not on file   Social Determinants of Health   Financial Resource Strain: Not on file  Food Insecurity: Not on file  Transportation Needs: Not on file  Physical Activity: Not on file  Stress: Not on file  Social Connections: Not on file  Intimate Partner Violence: Not on file    FAMILY HISTORY: History reviewed. No pertinent family history.  ALLERGIES:  is allergic to misc. sulfonamide containing compounds, sulfa antibiotics, cymbalta [duloxetine hcl], lyrica [pregabalin], and neurontin [gabapentin].  MEDICATIONS:  Current Outpatient Medications  Medication Sig Dispense Refill   albuterol (VENTOLIN HFA) 108 (90 Base) MCG/ACT inhaler Inhale 2 puffs into the lungs every 6 (six) hours as needed for wheezing. 1 each 2   anastrozole (ARIMIDEX) 1 MG tablet Take 1 tablet (1 mg total) by mouth daily. 90 tablet 1  furosemide (LASIX) 20 MG tablet 1 every morning as needed significant swelling not for frequent use (Patient taking differently: Take 20 mg by mouth daily as needed for fluid. 1 every morning as needed significant swelling not for frequent use) 14 tablet 0   gabapentin (NEURONTIN) 100 MG capsule Take 1 capsule (100 mg total) by mouth 3 (three) times daily. 90 capsule 0   ibuprofen (ADVIL) 200 MG tablet Take 200 mg by mouth every 8 (eight) hours as needed for moderate pain.     lisinopril-hydrochlorothiazide (ZESTORETIC) 20-12.5 MG tablet Take 1  tablet by mouth once daily 30 tablet 1   mirabegron ER (MYRBETRIQ) 50 MG TB24 tablet Take 1 tablet (50 mg total) by mouth daily. 28 tablet 0   ondansetron (ZOFRAN) 4 MG tablet Take 1 tablet (4 mg total) by mouth every 8 (eight) hours as needed. 30 tablet 1   oxycodone (OXY-IR) 5 MG capsule Take 1 capsule (5 mg total) by mouth every 4 (four) hours as needed. 30 capsule 0   simvastatin (ZOCOR) 20 MG tablet Take 1 tablet (20 mg total) by mouth at bedtime. 30 tablet 0   No current facility-administered medications for this visit.    REVIEW OF SYSTEMS:   Review of Systems  Constitutional:  Negative for appetite change.  Cardiovascular:  Positive for chest pain (5/10 L side breast) and leg swelling (occasional ankle).  Gastrointestinal:  Positive for nausea.  Neurological:  Positive for dizziness and numbness.  Psychiatric/Behavioral:  Positive for depression and sleep disturbance.   All other systems reviewed and are negative.  PHYSICAL EXAMINATION: ECOG PERFORMANCE STATUS: 0 - Asymptomatic  Vitals:   01/20/22 1329  BP: (!) 157/84  Pulse: 84  Resp: 18  Temp: (!) 97.4 F (36.3 C)  SpO2: 97%   Filed Weights   01/20/22 1329  Weight: 246 lb 3.2 oz (111.7 kg)   Physical Exam Vitals reviewed.  Constitutional:      Appearance: Normal appearance. She is obese.  Cardiovascular:     Rate and Rhythm: Normal rate and regular rhythm.     Pulses: Normal pulses.     Heart sounds: Normal heart sounds.  Pulmonary:     Effort: Pulmonary effort is normal.     Breath sounds: Normal breath sounds.  Chest:  Breasts:    Right: Normal. No swelling, bleeding, inverted nipple, mass, nipple discharge, skin change or tenderness.     Left: Normal. No swelling, bleeding, inverted nipple, mass, nipple discharge, skin change (UOQ and LOQ lumpectomy scar WNL) or tenderness.  Abdominal:     Palpations: Abdomen is soft. There is no hepatomegaly, splenomegaly or mass.     Tenderness: There is no abdominal  tenderness.  Musculoskeletal:     Right lower leg: No edema.     Left lower leg: No edema.  Lymphadenopathy:     Upper Body:     Right upper body: No supraclavicular, axillary or pectoral adenopathy.     Left upper body: No supraclavicular, axillary or pectoral adenopathy.  Neurological:     General: No focal deficit present.     Mental Status: She is alert and oriented to person, place, and time.  Psychiatric:        Mood and Affect: Mood normal.        Behavior: Behavior normal.    Breast Exam Chaperone: Thana Ates    LABORATORY DATA:  I have reviewed the data as listed Recent Results (from the past 2160 hour(s))  I-STAT creatinine  Status: None   Collection Time: 11/04/21 10:47 AM  Result Value Ref Range   Creatinine, Ser 0.80 0.44 - 1.00 mg/dL  Urinalysis, Routine w reflex microscopic     Status: Abnormal   Collection Time: 11/07/21  4:41 PM  Result Value Ref Range   Specific Gravity, UA >1.030 (H) 1.005 - 1.030   pH, UA 5.5 5.0 - 7.5   Color, UA Yellow Yellow   Appearance Ur Clear Clear   Leukocytes,UA Trace (A) Negative   Protein,UA Trace (A) Negative/Trace   Glucose, UA Negative Negative   Ketones, UA Negative Negative   RBC, UA 3+ (A) Negative   Bilirubin, UA Negative Negative   Urobilinogen, Ur 0.2 0.2 - 1.0 mg/dL   Nitrite, UA Negative Negative   Microscopic Examination See below:   Microscopic Examination     Status: Abnormal   Collection Time: 11/07/21  4:41 PM   Urine  Result Value Ref Range   WBC, UA 0-5 0 - 5 /hpf   RBC 11-30 (A) 0 - 2 /hpf   Epithelial Cells (non renal) >10 (A) 0 - 10 /hpf   Renal Epithel, UA None seen None seen /hpf   Mucus, UA Present Not Estab.   Bacteria, UA Many (A) None seen/Few  Urine culture     Status: None   Collection Time: 11/07/21  5:02 PM   Specimen: Urine   Urine  Result Value Ref Range   Urine Culture, Routine Final report    Organism ID, Bacteria Comment     Comment: Mixed urogenital flora 25,000-50,000  colony forming units per mL   Urinalysis, Routine w reflex microscopic     Status: Abnormal   Collection Time: 11/21/21  3:57 PM  Result Value Ref Range   Specific Gravity, UA >1.030 (H) 1.005 - 1.030   pH, UA 5.0 5.0 - 7.5   Color, UA Yellow Yellow   Appearance Ur Clear Clear   Leukocytes,UA 1+ (A) Negative   Protein,UA 1+ (A) Negative/Trace   Glucose, UA Negative Negative   Ketones, UA Trace (A) Negative   RBC, UA 3+ (A) Negative   Bilirubin, UA Negative Negative   Urobilinogen, Ur 0.2 0.2 - 1.0 mg/dL   Nitrite, UA Negative Negative   Microscopic Examination Comment     Comment: Microscopic follows if indicated.  Basic metabolic panel     Status: Abnormal   Collection Time: 12/25/21  9:41 AM  Result Value Ref Range   Sodium 138 135 - 145 mmol/L   Potassium 4.1 3.5 - 5.1 mmol/L   Chloride 102 98 - 111 mmol/L   CO2 27 22 - 32 mmol/L   Glucose, Bld 103 (H) 70 - 99 mg/dL    Comment: Glucose reference range applies only to samples taken after fasting for at least 8 hours.   BUN 16 6 - 20 mg/dL   Creatinine, Ser 0.71 0.44 - 1.00 mg/dL   Calcium 9.3 8.9 - 10.3 mg/dL   GFR, Estimated >60 >60 mL/min    Comment: (NOTE) Calculated using the CKD-EPI Creatinine Equation (2021)    Anion gap 9 5 - 15    Comment: Performed at [facility], [address], [place], [place] [address]  Surgical pathology     Status: None   Collection Time: 12/27/21  9:40 AM  Result Value Ref Range   SURGICAL PATHOLOGY      SURGICAL PATHOLOGY CASE: [id number] PATIENT: Jesilyn Bains Surgical Pathology Report     Clinical History: atypical lobular hyperplasia,  left breast   FINAL MICROSCOPIC DIAGNOSIS:  A. BREAST, LEFT, LUMPECTOMY: -  Sclerotic fibroadenomatoid nodule -  Columnar cell and fibrocystic changes -  Pseudoangiomatous stromal hyperplasia -  Previous biopsy site changes -  No malignancy identified  B. 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 -  Lobular neoplasia (atypical lobular hyperplasia) -  Margins uninvolved by carcinoma (0.25 cm; lateral margin) -  Previous biopsy site changes -  See oncology table and comment below  ONCOLOGY TABLE:  DCIS OF THE BREAST:  Resection  Procedure: Excision Specimen Laterality: Left Histologic Type: Ductal carcinoma in situ Size of DCIS: 0.15 cm nuclear Grade: G2, intermediate grade Necrosis: Not identified Margins: All margins negative for DCIS      Specify Closest Margin (required on ly if <49mm): 2.5 Regional Lymph Nodes: Not applicable (no lymph nodes submitted or found) Breast Biomarker Testing: Pending Pathologic Stage Classification (pTNM, AJCC 8th Edition): pTis, pN not assigned Representative Tumor Block: A4 Comment(s): Cytokeratin 5/6 is negative in the foci of ductal and lobular atypia.  ER shows weak expression in the DCIS.  E-cadherin is negative in the lobular neoplasia but positive in the ductal hyperplasia.  Cytokeratin AE1/3 does not highlight an infiltrative epithelial component.  Cytokeratin 5/6 is positive in background ductal hyperplasia.  Dr. Alric Seton reviewed the case and agrees with the above diagnosis.  (v4.4.0.0)  GROSS DESCRIPTION:  A: Specimen type: Received placed in formalin at 10 AM on 12/27/2021, and labeled left breast excisional biopsy Size: 5.7 cm superior to inferior by 4.7 cm medial to lateral by 2.5 cm anterior to posterior Orientation: The specimen is received inked as follows: Anterior- green, inferior-  blue, lateral- orange, medial- yellow, posterior- black, superior- red. Localized area: No areas of interest are designated on the specimen. Cut surface: Yellow lobulated adipose tissue with tan-white fibrous tissue (approximately 20% fibrous).  The specimen is focally disrupted, and reapproximated with black suture material.  The RF ID tag is identified within the specimen container.  Identified adjacent to the disrupted area of  the specimen is a 1.0 x 0.9 x 0.8 cm tan-white, firm, circumscribed possible lesion.  A silver metallic wing shaped biopsy clip is identified within the larger area of interest. Approximately 0.5 cm from the larger area of interest, a second tan-white, firm, circumscribed area is identified measuring 1.0 x 0.5 x 0.4 cm. No biopsy clip is seen within this second possible area of interest.  The tissue is radiographed, and a ribbon-shaped biopsy clip is identified within a 0.4 cm area of tan-white tissue. Margins: Margins are inked as previously st ated.  The larger area of interest measures 0.5 cm to the anterior margin, 0.9 cm to the medial margin, 1.2 cm to the posterior margin, 1.5 cm to the lateral margin, and 0.5 cm to the superior margin.  The smaller area of interest measures 0.4 cm to the posterior margin, 1.2 cm from the lateral margin, 1.1 cm from the anterior margin, 2.0 cm from the medial margin, and 1.1 cm from the superior margin. The tissue surrounding the ribbon clip appears to measure 0.4 cm to the anterior margin. Prognostic indicators: Obtain from paraffin blocks if needed Block summary: 8 blocks submitted 1 = inferior margin, perpendicular 2 and 3 = area of interest with wing clip, with anterior, medial, posterior margins 4 = anterior margin 5 - 6= second possible area of interest, with lateral and posterior margins 7= area of ribbon clip 8 =  superior margin, perpendicular  B: Specimen type: Received placed in formalin at 10:45 AM on 12/27/2021, and labeled left breast excisional biopsy. S ize: 6.0 cm anterior to posterior by 5.5 cm superior to inferior by 4.1 cm medial to lateral Orientation: Specimen is received inked as follows: Anterior- green, inferior- blue, lateral- orange, medial- yellow, posterior- black, superior- red. Localized area: No areas of interest are documented on the specimen. Cut surface: Yellow lobulated adipose tissue with tan-white,  dense fibrous tissue (approximately 20% fibrous).  There is a 1.4 x 1.3 x 0.8 cm tan-pink to red, ill-defined, slightly stellate area identified. Adjacent to the possible area of interest an RF ID tag is identified.  A silver metallic X shaped biopsy clip is identified within the area of interest. Margins: Margins are inked as previously stated.  The possible area of interest measures 0.5 cm to the lateral margin, 1.3 cm to the superior margin, 2.0 cm to the medial margin, 1.3 cm from the inferior margin. Prognostic indicators: Obtain from paraffin blocks if needed Block summary: 8 blocks submitted 1  = posterior margin, perpendicular 2 and 3 = area of interest with lateral margin 4 and 5 = lateral and superior margins 6 = medial margin 7 = inferior margin 8 = anterior margin, perpendicular   Craig Staggers 12/28/2021)      Final Diagnosis performed by Thressa Sheller, MD.   Electronically signed 12/31/2021 Technical component performed at [facility], [place] [address], [place], [place] [address].  Professional component performed at [employer]. [facility], [place] [address] [place], [place] [address].  Immunohistochemistry Technical component (if applicable) was performed at [facility]. [address], [place], [place], [place] [address].   IMMUNOHISTOCHEMISTRY DISCLAIMER (if applicable): Some of these immunohistochemical stains may have been developed and the performance characteristics determine by [facility]. Some may not have been cleared or approved by the U.S. Food and Drug Administration. The FDA has determined  that such clearance or approval is not necessary. This test is used for clinical purposes. It should not be regarded as investigational or for research. This laboratory is certified under the [place] (CLIA-88) as qualified to perform high complexity clinical laboratory testing.   The controls stained appropriately.   CBC with Differential     Status: Abnormal   Collection Time: 01/01/22  5:42 PM  Result Value Ref Range   WBC 9.3 4.0 - 10.5 K/uL   RBC 4.23 3.87 - 5.11 MIL/uL   Hemoglobin 12.3 12.0 - 15.0 g/dL   HCT 38.9 36.0 - 46.0 %   MCV 92.0 80.0 - 100.0 fL   MCH 29.1 26.0 - 34.0 pg   MCHC 31.6 30.0 - 36.0 g/dL   RDW 13.2 11.5 - 15.5 %   Platelets 248 150 - 400 K/uL   nRBC 0.0 0.0 - 0.2 %   Neutrophils Relative % 66 %   Neutro Abs 6.2 1.7 - 7.7 K/uL   Lymphocytes Relative 17 %   Lymphs Abs 1.5 0.7 - 4.0 K/uL   Monocytes Relative 5 %   Monocytes Absolute 0.5 0.1 - 1.0 K/uL   Eosinophils Relative 10 %   Eosinophils Absolute 0.9 (H) 0.0 - 0.5 K/uL   Basophils Relative 1 %   Basophils Absolute 0.1 0.0 - 0.1 K/uL   Immature Granulocytes 1 %   Abs Immature Granulocytes 0.07 0.00 - 0.07 K/uL    Comment: Performed at [facility], [address],  [place], [place] [address]  Basic metabolic panel     Status: Abnormal   Collection Time: 01/01/22  5:42 PM  Result Value Ref Range   Sodium 138 135 - 145 mmol/L   Potassium 3.8 3.5 - 5.1 mmol/L   Chloride 102 98 - 111 mmol/L   CO2 28 22 - 32 mmol/L   Glucose, Bld 100 (H) 70 - 99 mg/dL    Comment: Glucose reference range applies only to samples taken after fasting for at least 8 hours.   BUN 18 6 - 20 mg/dL   Creatinine, Ser 0.65 0.44 - 1.00 mg/dL   Calcium 9.1 8.9 - 10.3 mg/dL   GFR, Estimated >60 >60 mL/min    Comment: (NOTE) Calculated using the CKD-EPI Creatinine Equation (2021)    Anion gap 8 5 - 15    Comment: Performed at [facility], [address] [place], [place] [address]    RADIOGRAPHIC STUDIES: I have personally reviewed the radiological reports and agreed with the findings in the report. MM BREAST SURGICAL SPECIMEN  Result Date: 12/27/2021 CLINICAL DATA:  Evaluate surgical specimen following excision of LEFT breast flat epithelial atypia. EXAM: SPECIMEN RADIOGRAPH OF THE LEFT BREAST  COMPARISON:  Previous exam(s). FINDINGS: Status post excision of the LEFT breast. The RF tag and X shaped clip are present within the specimen. IMPRESSION: Specimen radiograph of the LEFT breast. Electronically Signed   By: Margarette Canada M.D.   On: 12/27/2021 10:48  MM Breast Surgical Specimen  Result Date: 12/27/2021 CLINICAL DATA:  Evaluate surgical specimen following excision of LEFT breast lobular neoplasia. EXAM: SPECIMEN RADIOGRAPH OF THE LEFT BREAST COMPARISON:  Previous exam(s). FINDINGS: Status post excision of the LEFT breast. The RF tag, WING shaped biopsy clip and RIBBON shaped biopsy clip are present within the specimen. IMPRESSION: Specimen radiograph of the LEFT breast. Electronically Signed   By: Margarette Canada M.D.   On: 12/27/2021 10:05  MM LT RADIO FREQUENCY TAG LOC MAMMO GUIDE  Result Date: 12/24/2021 CLINICAL DATA:  56 year old female with recently diagnosed flat epithelial atypia in the upper left breast at site of X shaped biopsy marking clip and atypical lobular hyperplasia in the left breast at site of ribbon and wing shaped biopsy marking clips. Patient presents for radiofrequency tag localization prior to excision. EXAM: RADIOFREQUENCY TAG LOCALIZATION OF THE LEFT BREAST WITH MAMMO GUIDANCE COMPARISON:  Previous exams. FINDINGS: Patient presents for needle localization prior to . I met with the patient and we discussed the procedure of needle localization including benefits and alternatives. We discussed the high likelihood of a successful procedure. We discussed the risks of the procedure, including infection, bleeding, tissue injury, and further surgery. Informed, written consent was given. The usual time-out protocol was performed immediately prior to the procedure. SITE 1: LEFT BREAST SUPERIOR X SHAPED BIOPSY MARKING CLIP (FLAT EPITHELIAL ATYPIA): Using mammographic guidance, sterile technique, 1% lidocaine and a 10 cm RF tag needle, the X shaped biopsy marking clip was localized  using a lateral to medial approach. RF tag function was subsequently confirmed. The images were marked for Dr. Constance Haw. SITE 2: LEFT BREAST 12 O'CLOCK (WING) AND 12:30 (RIBBON) POSITION (ATYPICAL LOBULAR HYPERPLASIA): Using mammographic guidance, sterile technique, 1% lidocaine and a 5 cm RF tag needle, the the ribbon and wing shaped biopsy marking clips were localized using a lateral to medial approach. RF tag function was subsequently confirmed. The images were marked for Dr. Constance Haw. IMPRESSION: RF tag localization (2 tags) of the left breast. Electronically Signed  By: Everlean Alstrom M.D.   On: 12/24/2021 10:05  MM LT RADIO FREQUENCY TAG EA ADD LESION LOC MAMMO GUIDE  Result Date: 12/24/2021 CLINICAL DATA:  56 year old female with recently diagnosed flat epithelial atypia in the upper left breast at site of X shaped biopsy marking clip and atypical lobular hyperplasia in the left breast at site of ribbon and wing shaped biopsy marking clips. Patient presents for radiofrequency tag localization prior to excision. EXAM: RADIOFREQUENCY TAG LOCALIZATION OF THE LEFT BREAST WITH MAMMO GUIDANCE COMPARISON:  Previous exams. FINDINGS: Patient presents for needle localization prior to . I met with the patient and we discussed the procedure of needle localization including benefits and alternatives. We discussed the high likelihood of a successful procedure. We discussed the risks of the procedure, including infection, bleeding, tissue injury, and further surgery. Informed, written consent was given. The usual time-out protocol was performed immediately prior to the procedure. SITE 1: LEFT BREAST SUPERIOR X SHAPED BIOPSY MARKING CLIP (FLAT EPITHELIAL ATYPIA): Using mammographic guidance, sterile technique, 1% lidocaine and a 10 cm RF tag needle, the X shaped biopsy marking clip was localized using a lateral to medial approach. RF tag function was subsequently confirmed. The images were marked for Dr. Constance Haw. SITE 2:  LEFT BREAST 12 O'CLOCK (WING) AND 12:30 (RIBBON) POSITION (ATYPICAL LOBULAR HYPERPLASIA): Using mammographic guidance, sterile technique, 1% lidocaine and a 5 cm RF tag needle, the the ribbon and wing shaped biopsy marking clips were localized using a lateral to medial approach. RF tag function was subsequently confirmed. The images were marked for Dr. Constance Haw. IMPRESSION: RF tag localization (2 tags) of the left breast. Electronically Signed   By: Everlean Alstrom M.D.   On: 12/24/2021 10:05    ASSESSMENT:  Left breast DCIS: - Abnormal screening mammogram on 09/19/2021. - Left breast biopsy 12:30 mass-ALH, 12:00 mass-ALH, 9:00 mass fibroadenoma - Left breast lumpectomy on 12/27/2021 - Pathology shows DCIS, grade 2, 0.15 cm, margins negative, atypical lobular hyperplasia.  ER shows weak expression and DCIS.  pTis, pNX   Social/family history: - Lives at home with her husband.  She works at [employer].  Non-smoker. - She is adopted.  Biological mother died of uterine cancer.   PLAN:  Left breast DCIS: - We discussed pathology report in detail. - We discussed antiestrogen therapy for 5 years with aromatase inhibitor, anastrozole.  We discussed side effects in detail. - We will make referral to radiation oncology. - We will also obtain baseline bone density test. - She was also recommended to start taking calcium and vitamin D supplements.  We will check vitamin D level prior to next visit in 3 months.    Derek Jack, MD 01/20/22 1:54 PM  [place] [phone]   I, Thana Ates, am acting as a scribe for Dr. Derek Jack.  I, Derek Jack MD, have reviewed the above documentation for accuracy and completeness, and I agree with the above.

## 2022-01-20 NOTE — Telephone Encounter (Signed)
Called patient to schedule a consultation w. Dr. Squire. No answer, LVM for a return call.  

## 2022-01-24 NOTE — Progress Notes (Signed)
Location of Breast Cancer:  Ductal carcinoma in situ (DCIS) of left breast  Histology per Pathology Report:  12/27/2021 FINAL MICROSCOPIC DIAGNOSIS:  A. BREAST, LEFT, LUMPECTOMY:  -  Sclerotic fibroadenomatoid nodule  -  Columnar cell and fibrocystic changes  -  Pseudoangiomatous stromal hyperplasia  -  Previous biopsy site changes  -  No malignancy identified  B. 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  -  Lobular neoplasia (atypical lobular hyperplasia)  -  Margins uninvolved by carcinoma (0.25 cm; lateral margin)  -  Previous biopsy site changes  -  See oncology table and comment below  Receptor Status: ER(weak expression)  Did patient present with symptoms (if so, please note symptoms) or was this found on screening mammography?: 09/19/2021 Patient had her first screening mammogram and was called back for abnormalities in the left breast  Past/Anticipated interventions by surgeon, if any:  12/27/2021 --Dr. Curlene Labrum Left excisional breast biopsies after radiofrequency tags in two separate distinct locations   Past/Anticipated interventions by medical oncology, if any:  Under care of Dr. Derek Jack  01/20/2022 --PLAN We discussed pathology report in detail. We discussed antiestrogen therapy for 5 years with aromatase inhibitor, anastrozole.  We discussed side effects in detail. We will make referral to radiation oncology. We will also obtain baseline bone density test. She was also recommended to start taking calcium and vitamin D supplements.  We will check vitamin D level prior to next visit in 3 months.  Lymphedema issues, if any:  Patient denies, but does report some range of motion limitation to her left arm; interested in OP-PT at [place] issues, if any:  Reports intermittent sharp pains since her surgery  SAFETY ISSUES: Prior radiation? No Pacemaker/ICD? No Possible current pregnancy?  No--postmenopausal (LMP: 10/06/2011); does report random episodes of spotting, but has not been able to see a GYN to investigate because of breast cancer work-up Is the patient on methotrexate? No  Current Complaints / other details:  Patient's biological mother had uterine cancer; she was adopted and her knowledge of her family medical history is limited.

## 2022-01-26 NOTE — Progress Notes (Signed)
Radiation Oncology         [phone] ________________________________  Initial Outpatient Consultation  Name: Tammy Calderon MRN: [id number]  Date: 01/27/2022  DOB: [birth year]  PI:RJJOAC, Elayne Snare, MD  Derek Jack, MD   REFERRING PHYSICIAN: Derek Jack, MD  DIAGNOSIS:    ICD-10-CM   1. Ductal carcinoma in situ (DCIS) of left breast  D05.12 Ambulatory referral to Physical Therapy    Pregnancy, urine    Ambulatory referral to Gynecologic Oncology    Pregnancy, urine    2. Family history of uterine cancer  Z80.49 Ambulatory referral to Gynecologic Oncology    3. Postmenopausal bleeding  N95.0 Ambulatory referral to Gynecologic Oncology      Stage 0 (cTis (DCIS), cN0, cM0) Left Breast, Intermediate grade ductal carcinoma in-situ, ER+ / PR not assessed / Her2 not assessed  CHIEF COMPLAINT: Here to discuss management of left breast DCIS  HISTORY OF PRESENT ILLNESS::Tammy Calderon is a 56 y.o. female who presented with left breast abnormality on the following imaging: bilateral screening mammogram on the date of 09/19/21.  No symptoms, if any, were reported at that time. Diagnostic left breast mammogram and ultrasound on 10/15/21 further revealed: an indeterminate 0.9 cm mass in the left breast 9 o'clock Position; an indeterminate 1.1 cm mass in the  left breast 12 o'clock position; and an indeterminate 0.5 cm mass in the left breast 12:30 o'clock retroareolar position.     Biopsies of the 12:00 and 12:30 o'clock left breast masses on 10/22/21 revealed atypical lobular hyperplasia and fibrocystic changes. Biopsy of the 9:00 o'clock left breast mass revealed fibroadenoma.  12 o'clock left breast biopsy on 11/11/21 revealed flat epithelial atypia, usual ductal and columnar cell hyperplasia arising in a fibroadenomatoid nodule, and pseudoangiomatous stromal hyperplasia.  She opted to proceed with left breast lumpectomy on 12/27/2021 under the care of Dr. Constance Haw. Pathology  from the procedure revealed: tumor size of 0.15 cm; histology of intermediate grade ductal carcinoma in-situ, with atypical lobular hyperplasia. No necrosis. (Pathology also indicated a sclerotic fibroadenomatoid nodule, columnar cells, fibrocystic changes, and pseudoangiomatous stromal hyperplasia). All margins negative for DCIS. No lymph nodes were examined. ER status: positive (weak); PR not assessed, Her2 not assessed.  On 01/01/22, the patient presented to the ED with shooting pains in both breasts, left greater than right, and mild redness and warmth around both incisions. Physical exam performed revealed diffuse tenderness to palpation of the left breast, mild erythema superior to the areola, and diffuse bruising. Following evaluation, differentials were noted to include cellulitis, postop infection, abscess, or postop changes. Given no concerning symptoms to suggest wound infection, she was discharged with instructions to follow up with Dr. Constance Haw. Following evaluation by Dr. Constance Haw on 01/09/22, her breast pain was determined as likely neuropathic in nature, and she was accordingly prescribed low dose gabapentin to manage her pain.  In most recent history, the patient was referred to Dr. Delton Coombes on 01/20/22 to discuss treatment options. In addition to XRT, Dr. Delton Coombes recommended antiestrogen therapy for 5 years with AI consisting of anastrozole. The patient is agreeable to this plan.   Of note: the patient underwent a CT of the abdomen and pelvis on 11/04/21 for evaluation of 2-3 months of gross hematuria and left sided flank pain. CT showed no evidence of hydronephrosis, renal, ureteral, or bladder calculi, or solid enhancing renal masses. A nonspecific nodular enhancement was however appreciated along the fundal portion of the endometrium, possibly reflecting a uterine fibroid. [name] was also  appreciated about the mesentery, with prominent mesenteric lymph nodes measuring up to 4  mm. 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  GYN referral never materialized as a consult due to distraction from breast workup. She was seen by urology (Dr Felipa Eth) - "Urologic evaluation with CT imaging and cystoscopy shows no serious or life-threatening causes for her hematuria. Cipro x 1 following cystoscopy; Awaiting consult OB/GYN for uterine fibroids, possible vaginal bleeding..."  PREVIOUS RADIATION THERAPY: No  PAST MEDICAL HISTORY:  has a past medical history of Asthma, Bell's palsy, Fibromyalgia, Hypertension, and PONV (postoperative nausea and vomiting).    PAST SURGICAL HISTORY: Past Surgical History:  Procedure Laterality Date   BREAST BIOPSY WITH RADIO FREQUENCY LOCALIZER Left 12/27/2021   Procedure: EXCISIONAL BREAST BIOPSY WITH RADIO FREQUENCY LOCALIZER X2;  Surgeon: Virl Cagey, MD;  Location: [facility];  Service: General;  Laterality: Left;   NASAL SEPTUM SURGERY     TUBAL LIGATION      FAMILY HISTORY: family history includes Uterine cancer in her mother.  SOCIAL HISTORY:  reports that she has never smoked. She has never used smokeless tobacco. She reports that she does not drink alcohol and does not use drugs.  ALLERGIES: Misc. sulfonamide containing compounds, Sulfa antibiotics, Cymbalta [duloxetine hcl], Lyrica [pregabalin], and Neurontin [gabapentin]  MEDICATIONS:  Current Outpatient Medications  Medication Sig Dispense Refill   albuterol (VENTOLIN HFA) 108 (90 Base) MCG/ACT inhaler Inhale 2 puffs into the lungs every 6 (six) hours as needed for wheezing. 1 each 2   anastrozole (ARIMIDEX) 1 MG tablet Take 1 tablet (1 mg total) by mouth daily. 90 tablet 1   furosemide (LASIX) 20 MG tablet 1 every morning as needed significant swelling not for frequent use (Patient taking differently: Take 20 mg by mouth daily as needed for fluid. 1 every morning as needed significant swelling not for frequent use) 14 tablet 0    gabapentin (NEURONTIN) 100 MG capsule Take 1 capsule (100 mg total) by mouth 3 (three) times daily. 90 capsule 0   ibuprofen (ADVIL) 200 MG tablet Take 200 mg by mouth every 8 (eight) hours as needed for moderate pain.     lisinopril-hydrochlorothiazide (ZESTORETIC) 20-12.5 MG tablet Take 1 tablet by mouth once daily 30 tablet 1   mirabegron ER (MYRBETRIQ) 50 MG TB24 tablet Take 1 tablet (50 mg total) by mouth daily. 28 tablet 0   ondansetron (ZOFRAN) 4 MG tablet Take 1 tablet (4 mg total) by mouth every 8 (eight) hours as needed. 30 tablet 1   oxycodone (OXY-IR) 5 MG capsule Take 1 capsule (5 mg total) by mouth every 4 (four) hours as needed. 30 capsule 0   simvastatin (ZOCOR) 20 MG tablet Take 1 tablet (20 mg total) by mouth at bedtime. 30 tablet 0   No current facility-administered medications for this encounter.    REVIEW OF SYSTEMS: As above in HPI.   PHYSICAL EXAM:  height is $RemoveB'5\' 1"'GiGKgnRw$  (1.549 m) and weight is 245 lb 6 oz (111.3 kg). Her temporal temperature is 97.3 F (36.3 C) (abnormal). Her blood pressure is 133/80 and her pulse is 89. Her respiration is 18 and oxygen saturation is 100%.   General: Alert and oriented, in no acute distress HEENT: Head is normocephalic. Extraocular movements are intact.  Ext: decent ROM in shoulders b/l Neurologic:  No obvious focalities. Speech is fluent. Ambulatory. Psychiatric: Judgment and insight are intact. Affect is appropriate. Breasts: seromas (mild to moderate) in upper and  lower left breast from lumpectomy x 2 . No other palpable masses appreciated in the breasts or axillae bilaterally .   ECOG = 1   0 - Asymptomatic (Fully active, able to carry on all predisease activities without restriction)  1 - Symptomatic but completely ambulatory (Restricted in physically strenuous activity but ambulatory and able to carry out work of a light or sedentary nature. For example, light housework, office work)  2 - Symptomatic, <50% in bed during the day  (Ambulatory and capable of all self care but unable to carry out any work activities. Up and about more than 50% of waking hours)  3 - Symptomatic, >50% in bed, but not bedbound (Capable of only limited self-care, confined to bed or chair 50% or more of waking hours)  4 - Bedbound (Completely disabled. Cannot carry on any self-care. Totally confined to bed or chair)  5 - Death   Eustace Pen MM, Creech RH, Tormey DC, et al. [phone]). "Toxicity and response criteria of the [facility]". [place] Oncol. 5 (6): 649-55   LABORATORY DATA:  Lab Results  Component Value Date   WBC 9.3 01/01/2022   HGB 12.3 01/01/2022   HCT 38.9 01/01/2022   MCV 92.0 01/01/2022   PLT 248 01/01/2022   CMP     Component Value Date/Time   NA 138 01/01/2022 1742   NA 143 04/01/2021 2012   K 3.8 01/01/2022 1742   CL 102 01/01/2022 1742   CO2 28 01/01/2022 1742   GLUCOSE 100 (H) 01/01/2022 1742   BUN 18 01/01/2022 1742   BUN 13 04/01/2021 2012   CREATININE 0.65 01/01/2022 1742   CREATININE 0.59 04/13/2014 1636   CALCIUM 9.1 01/01/2022 1742   PROT 8.0 09/19/2021 1117   ALBUMIN 4.3 09/19/2021 1117   AST 42 (H) 09/19/2021 1117   ALT 53 (H) 09/19/2021 1117   ALKPHOS 127 (H) 09/19/2021 1117   BILITOT 0.8 09/19/2021 1117   GFRNONAA >60 01/01/2022 1742        RADIOGRAPHY:  as above    IMPRESSION/PLAN: Intermediate Grade Left breast DCIS with limited sensitivity to estrogen   It was a pleasure meeting the patient today. We discussed the risks, benefits, and side effects of radiotherapy. I recommend radiotherapy to the left breast over 3-4 weeks to reduce her risk of locoregional recurrence by at least half.   We spoke about acute effects including skin irritation, tenderness, and fatigue as well as much less common late effects including internal organ injury or irritation (ie heart/lungs). We spoke about the latest technology that is used to minimize the risk of late effects for patients  undergoing radiotherapy to the breast or chest wall. No guarantees of treatment were given. The patient is enthusiastic about proceeding with treatment. I look forward to participating in the patient's care.  I will placed orders for CT simulation/treatment planning in the next available slot, anticipating treatment to start in about 2 weeks.  Consent signed today.  Urine preg test ordered for today (vaginal bleeding, age <55yo) Referral to GYN placed, Dr. Berline Lopes messaged about her symptoms (vaginal bleeding, post menopausal, family hx of uterine cancer) Referral to PT (post op tightness in axilla/arm per patient)   On date of service, in total, I spent 45 minutes on this encounter. Patient was seen in person.   __________________________________________   Eppie Gibson, MD  This document serves as a record of services personally performed by Eppie Gibson, MD. It was created on her  behalf by Roney Mans, a trained medical scribe. The creation of this record is based on the scribe's personal observations and the provider's statements to them. This document has been checked and approved by the attending provider.

## 2022-01-27 ENCOUNTER — Ambulatory Visit
Admission: RE | Admit: 2022-01-27 | Discharge: 2022-01-27 | Disposition: A | Discharge status: Home / Self Care | Payer: 59 | Source: Ambulatory Visit | Attending: Radiation Oncology | Admitting: Radiation Oncology

## 2022-01-27 ENCOUNTER — Other Ambulatory Visit: Payer: Self-pay

## 2022-01-27 ENCOUNTER — Encounter: Payer: Self-pay | Admitting: Radiation Oncology

## 2022-01-27 VITALS — BP 133/80 | HR 89 | Temp 97.3°F | Resp 18 | Ht 61.0 in | Wt 245.4 lb

## 2022-01-27 DIAGNOSIS — Z170 Estrogen receptor positive status [ER+]: Secondary | ICD-10-CM | POA: Diagnosis not present

## 2022-01-27 DIAGNOSIS — Z79811 Long term (current) use of aromatase inhibitors: Secondary | ICD-10-CM | POA: Diagnosis not present

## 2022-01-27 DIAGNOSIS — N950 Postmenopausal bleeding: Secondary | ICD-10-CM

## 2022-01-27 DIAGNOSIS — Z510 Encounter for antineoplastic radiation therapy: Secondary | ICD-10-CM | POA: Diagnosis present

## 2022-01-27 DIAGNOSIS — I10 Essential (primary) hypertension: Secondary | ICD-10-CM | POA: Insufficient documentation

## 2022-01-27 DIAGNOSIS — R319 Hematuria, unspecified: Secondary | ICD-10-CM | POA: Diagnosis not present

## 2022-01-27 DIAGNOSIS — D0512 Intraductal carcinoma in situ of left breast: Secondary | ICD-10-CM | POA: Insufficient documentation

## 2022-01-27 DIAGNOSIS — R109 Unspecified abdominal pain: Secondary | ICD-10-CM | POA: Diagnosis not present

## 2022-01-27 DIAGNOSIS — J45909 Unspecified asthma, uncomplicated: Secondary | ICD-10-CM | POA: Insufficient documentation

## 2022-01-27 DIAGNOSIS — N3941 Urge incontinence: Secondary | ICD-10-CM | POA: Diagnosis not present

## 2022-01-27 DIAGNOSIS — N841 Polyp of cervix uteri: Secondary | ICD-10-CM | POA: Diagnosis not present

## 2022-01-27 DIAGNOSIS — M797 Fibromyalgia: Secondary | ICD-10-CM | POA: Diagnosis not present

## 2022-01-27 DIAGNOSIS — Z0000 Encounter for general adult medical examination without abnormal findings: Secondary | ICD-10-CM | POA: Diagnosis present

## 2022-01-27 DIAGNOSIS — Z79899 Other long term (current) drug therapy: Secondary | ICD-10-CM | POA: Insufficient documentation

## 2022-01-27 DIAGNOSIS — Z8049 Family history of malignant neoplasm of other genital organs: Secondary | ICD-10-CM

## 2022-01-27 LAB — PREGNANCY, URINE: Preg Test, Ur: NEGATIVE

## 2022-01-28 ENCOUNTER — Ambulatory Visit (INDEPENDENT_AMBULATORY_CARE_PROVIDER_SITE_OTHER): Payer: 59 | Admitting: General Surgery

## 2022-01-28 ENCOUNTER — Encounter: Payer: Self-pay | Admitting: General Surgery

## 2022-01-28 ENCOUNTER — Telehealth: Payer: Self-pay | Admitting: *Deleted

## 2022-01-28 VITALS — BP 138/92 | HR 72 | Temp 98.1°F | Resp 16 | Ht 61.0 in | Wt 246.0 lb

## 2022-01-28 DIAGNOSIS — D0512 Intraductal carcinoma in situ of left breast: Secondary | ICD-10-CM

## 2022-01-28 DIAGNOSIS — M792 Neuralgia and neuritis, unspecified: Secondary | ICD-10-CM

## 2022-01-28 NOTE — Patient Instructions (Signed)
Continue gabapentin. Will talk in 1 month regarding starting to titrate this down.

## 2022-01-28 NOTE — Telephone Encounter (Signed)
CALLED GYN/ONC  TO ARRANGE AN APPT. WITH DR. Berline Lopes FOR THIS PATIENT, SPOKE WITH ASHLEY AND SHE WILL GET THIS APPT. ARRANGED FOR THIS PATIENT

## 2022-01-28 NOTE — Progress Notes (Signed)
[facility]   Improved pain with the gabapentin. Is getting set up with radiation. She says she has some shooting pain on ocassion.   Overall improving.   BP (!) 138/92    Pulse 72    Temp 98.1 F (36.7 C) (Oral)    Resp 16    Ht 5\' 1"  (1.549 m)    Wt 246 lb (111.6 kg)    LMP 10/06/2011    SpO2 98%    BMI 46.48 kg/m  Incisions healing, no erythema or drainage, some bruising still on superior incision Seroma/ induration improving   Patient s/p excisional breast biopsy with DCIS. Doing well overall.  Continue gabapentin. Will talk in 1 month regarding starting to titrate this down.   Future Appointments  Date Time Provider [place]  01/29/2022  8:00 AM Eppie Gibson, MD [facility] None  02/03/2022 12:30 PM [facility] [place] [facility]  02/05/2022 12:30 PM Eppie Gibson, MD [facility] None  02/06/2022  8:30 AM [facility] LINAC 3 [facility] None  02/07/2022 12:00 PM [facility] LINAC 3 [facility] None  02/10/2022 12:00 PM [facility] LINAC 3 [facility] None  02/11/2022  8:30 AM [facility] LINAC 3 [facility] None  02/12/2022  8:30 AM [facility] LINAC 3 [facility] None  02/13/2022  8:30 AM [facility] LINAC 3 [facility] None  02/14/2022  8:30 AM [facility] LINAC 3 [facility] None  02/17/2022  8:30 AM [facility] LINAC 3 [facility] None  02/18/2022  8:30 AM [facility] LINAC 3 [facility] None  02/19/2022  8:30 AM [facility] LINAC 3 [facility] None  02/20/2022  8:30 AM [facility] LINAC 3 [facility] None  02/21/2022  8:30 AM [facility] LINAC 3 [facility] None  02/24/2022  8:30 AM [facility] LINAC 3 [facility] None  02/25/2022  8:30 AM [facility] LINAC 3 [facility] None  02/26/2022  8:30 AM [facility] LINAC 3 [facility] None  02/27/2022  8:30 AM [facility] LINAC 3 [facility] None  02/28/2022  8:30 AM [facility] LINAC 3 [facility] None  02/28/2022  9:00 AM Virl Cagey, MD [facility] None  03/03/2022  8:30 AM [facility] LINAC 3 [facility] None  03/04/2022   8:30 AM [facility] LINAC 3 [facility] None  03/05/2022  2:45 PM Leeroy Cha, PT [facility] None  03/07/2022  3:30 PM Leeroy Cha, PT [facility] None  03/10/2022 11:30 AM Roseanne Reno B, PTA [facility] None  03/12/2022  1:15 PM Roseanne Reno B, PTA [facility] None  03/14/2022  2:00 PM Leeroy Cha, PT [facility] None  03/17/2022 11:30 AM Roseanne Reno B, PTA [facility] None  03/19/2022  1:15 PM Susy Frizzle, PTA [facility] None  03/21/2022  3:30 PM Susy Frizzle, PTA [facility] None  03/24/2022 11:30 AM Teena Irani, PTA [facility] None  03/26/2022  1:15 PM Roseanne Reno B, PTA [facility] None  03/28/2022  1:15 PM Leeroy Cha, PT [facility] None  04/01/2022  2:50 PM [facility] None  04/08/2022  3:30 PM Derek Jack, MD [facility] None   Curlene Labrum, MD [facility] [address] [place], [place] [address] [phone] (office)

## 2022-01-29 ENCOUNTER — Other Ambulatory Visit: Payer: Self-pay

## 2022-01-29 ENCOUNTER — Ambulatory Visit
Admission: RE | Admit: 2022-01-29 | Discharge: 2022-01-29 | Disposition: A | Discharge status: Home / Self Care | Payer: 59 | Source: Ambulatory Visit | Attending: Radiation Oncology | Admitting: Radiation Oncology

## 2022-01-29 ENCOUNTER — Telehealth: Payer: Self-pay | Admitting: *Deleted

## 2022-01-29 ENCOUNTER — Ambulatory Visit (HOSPITAL_COMMUNITY): Payer: 59 | Admitting: Hematology

## 2022-01-29 DIAGNOSIS — Z0000 Encounter for general adult medical examination without abnormal findings: Secondary | ICD-10-CM | POA: Diagnosis not present

## 2022-01-29 NOTE — Telephone Encounter (Signed)
Spoke with the patient and scheduled a new patient appt on 2/27 at 9:45 am with Dr Berline Lopes. Patient aware of the address and phone number for the clinic; along with the policy for mask and visitors

## 2022-02-03 ENCOUNTER — Other Ambulatory Visit: Payer: Self-pay

## 2022-02-03 ENCOUNTER — Ambulatory Visit (HOSPITAL_COMMUNITY)
Admission: RE | Admit: 2022-02-03 | Discharge: 2022-02-03 | Disposition: A | Discharge status: Home / Self Care | Payer: 59 | Source: Ambulatory Visit | Attending: Hematology | Admitting: Hematology

## 2022-02-03 DIAGNOSIS — D0512 Intraductal carcinoma in situ of left breast: Secondary | ICD-10-CM | POA: Insufficient documentation

## 2022-02-04 DIAGNOSIS — Z0000 Encounter for general adult medical examination without abnormal findings: Secondary | ICD-10-CM | POA: Diagnosis not present

## 2022-02-05 ENCOUNTER — Ambulatory Visit
Admission: RE | Admit: 2022-02-05 | Discharge: 2022-02-05 | Disposition: A | Discharge status: Home / Self Care | Payer: 59 | Source: Ambulatory Visit | Attending: Radiation Oncology | Admitting: Radiation Oncology

## 2022-02-05 ENCOUNTER — Other Ambulatory Visit: Payer: Self-pay

## 2022-02-05 DIAGNOSIS — S46911A Strain of unspecified muscle, fascia and tendon at shoulder and upper arm level, right arm, initial encounter: Secondary | ICD-10-CM | POA: Insufficient documentation

## 2022-02-05 DIAGNOSIS — S0591XA Unspecified injury of right eye and orbit, initial encounter: Secondary | ICD-10-CM | POA: Insufficient documentation

## 2022-02-05 DIAGNOSIS — X58XXXA Exposure to other specified factors, initial encounter: Secondary | ICD-10-CM | POA: Insufficient documentation

## 2022-02-05 DIAGNOSIS — Z0000 Encounter for general adult medical examination without abnormal findings: Secondary | ICD-10-CM | POA: Diagnosis not present

## 2022-02-05 DIAGNOSIS — S4991XA Unspecified injury of right shoulder and upper arm, initial encounter: Secondary | ICD-10-CM | POA: Diagnosis present

## 2022-02-05 DIAGNOSIS — Z853 Personal history of malignant neoplasm of breast: Secondary | ICD-10-CM | POA: Diagnosis not present

## 2022-02-06 ENCOUNTER — Emergency Department (HOSPITAL_COMMUNITY)
Admission: EM | Admit: 2022-02-06 | Discharge: 2022-02-06 | Disposition: A | Discharge status: Home / Self Care | Payer: 59 | Attending: Emergency Medicine | Admitting: Emergency Medicine

## 2022-02-06 ENCOUNTER — Ambulatory Visit: Payer: 59

## 2022-02-06 ENCOUNTER — Emergency Department (HOSPITAL_COMMUNITY): Payer: 59

## 2022-02-06 ENCOUNTER — Encounter (HOSPITAL_COMMUNITY): Payer: Self-pay | Admitting: Emergency Medicine

## 2022-02-06 DIAGNOSIS — S46911A Strain of unspecified muscle, fascia and tendon at shoulder and upper arm level, right arm, initial encounter: Secondary | ICD-10-CM

## 2022-02-06 DIAGNOSIS — S0591XA Unspecified injury of right eye and orbit, initial encounter: Secondary | ICD-10-CM

## 2022-02-06 MED ORDER — FLUORESCEIN SODIUM 1 MG OP STRP
1.0000 | ORAL_STRIP | Freq: Once | OPHTHALMIC | Status: AC
  Administered 2022-02-06: 1 via OPHTHALMIC
  Filled 2022-02-06: qty 1

## 2022-02-06 MED ORDER — TETRACAINE HCL 0.5 % OP SOLN
2.0000 [drp] | Freq: Once | OPHTHALMIC | Status: AC
Start: 2022-02-06 — End: 2022-02-06
  Administered 2022-02-06: 2 [drp] via OPHTHALMIC
  Filled 2022-02-06: qty 4

## 2022-02-06 MED ORDER — HYDROCODONE-ACETAMINOPHEN 5-325 MG PO TABS
1.0000 | ORAL_TABLET | Freq: Once | ORAL | Status: AC
  Administered 2022-02-06: 1 via ORAL
  Filled 2022-02-06: qty 1

## 2022-02-06 NOTE — ED Provider Notes (Signed)
[facility] EMERGENCY DEPARTMENT Provider Note   CSN: [id number] Arrival date & time: 02/05/22  2248     History  Chief Complaint  Patient presents with   Multiple Complaints    Tammy Calderon is a 56 y.o. female.  The history is provided by the patient.  Shoulder Pain Location:  Shoulder Shoulder location:  R shoulder Injury: no   Pain details:    Quality:  Aching   Radiates to:  R upper arm   Severity:  Moderate   Onset quality:  Gradual   Duration:  3 months   Timing:  Intermittent   Progression:  Worsening Relieved by:  Rest Worsened by:  Movement Associated symptoms: no fever, no muscle weakness, no neck pain and no numbness   Patient presents for 2 complaints.  She reports right shoulder pain for the past 3 months.  No trauma but may have overexerted it No neck pain.  No weakness, but it hurts to raise above her head. She reports she is undergoing radiation for breast cancer and has very little time to spare to have this evaluated  Patient also reports she accidentally got sprayed in her eye several hours ago.  She was spraying on muscle pain reliever and it actually got into her right eye.  She reports she flushed her eye out but it is still burning.  She does not wear corrective lenses    Home Medications Prior to Admission medications   Medication Sig Start Date End Date Taking? Authorizing Provider  albuterol (VENTOLIN HFA) 108 (90 Base) MCG/ACT inhaler Inhale 2 puffs into the lungs every 6 (six) hours as needed for wheezing. 07/24/21   Vanessa Kick, MD  anastrozole (ARIMIDEX) 1 MG tablet Take 1 tablet (1 mg total) by mouth daily. 01/20/22   Derek Jack, MD  furosemide (LASIX) 20 MG tablet 1 every morning as needed significant swelling not for frequent use Patient taking differently: Take 20 mg by mouth daily as needed for fluid. 1 every morning as needed significant swelling not for frequent use 10/15/21   Kathyrn Drown, MD  gabapentin (NEURONTIN)  100 MG capsule Take 1 capsule (100 mg total) by mouth 3 (three) times daily. 01/09/22 02/08/22  Virl Cagey, MD  ibuprofen (ADVIL) 200 MG tablet Take 200 mg by mouth every 8 (eight) hours as needed for moderate pain.    [provider]  lisinopril-hydrochlorothiazide (ZESTORETIC) 20-12.5 MG tablet Take 1 tablet by mouth once daily 01/16/22   Luking, Elayne Snare, MD  ondansetron (ZOFRAN) 4 MG tablet Take 1 tablet (4 mg total) by mouth every 8 (eight) hours as needed. 12/27/21 12/27/22  Virl Cagey, MD  oxycodone (OXY-IR) 5 MG capsule Take 1 capsule (5 mg total) by mouth every 4 (four) hours as needed. 01/16/22   Virl Cagey, MD      Allergies    Misc. sulfonamide containing compounds, Sulfa antibiotics, Cymbalta [duloxetine hcl], Lyrica [pregabalin], and Neurontin [gabapentin]    Review of Systems   Review of Systems  Constitutional:  Negative for fever.  Eyes:  Positive for visual disturbance.       Blurred vision and eye pain to right eye  Musculoskeletal:  Positive for arthralgias. Negative for neck pain.  Neurological:  Positive for headaches. Negative for weakness.   Physical Exam Updated Vital Signs BP (!) 155/85 (BP Location: Right Wrist)    Pulse 73    Temp 98.1 F (36.7 C) (Oral)    Resp 17  Ht 1.549 m (5\' 1" )    Wt 111.1 kg    LMP 10/06/2011    SpO2 100%    BMI 46.29 kg/m  Physical Exam CONSTITUTIONAL: Well developed/well nourished HEAD: Normocephalic/atraumatic EYES: EOMI/PERRL, mild conjunctival erythema of right eye.  No obvious foreign bodies noted.  No corneal abrasions.  pH in each eye is 7-7.5 No corneal haziness. ENMT: Mucous membranes moist NECK: supple no meningeal signs SPINE/BACK:entire spine nontender CV: S1/S2 noted, no murmurs/rubs/gallops noted LUNGS: Lungs are clear to auscultation bilaterally, no apparent distress ABDOMEN: soft, nontender, no rebound or guarding, bowel sounds noted throughout abdomen GU:no cva tenderness NEURO: Pt is  awake/alert/appropriate, moves all extremitiesx4.  No facial droop.   EXTREMITIES: pulses normal/equal, full ROM, mild tenderness to palpation of right shoulder.  No erythema.  No deformities  there is no edema to either upper extremity.  Distal pulses equal and intact.  She does have limitation in range of motion of her shoulder above her head SKIN: warm, color normal PSYCH: no abnormalities of mood noted, alert and oriented to situation  ED Results / Procedures / Treatments   Labs (all labs ordered are listed, but only abnormal results are displayed) Labs Reviewed - No data to display  EKG None  Radiology DG Shoulder Right  Result Date: 02/06/2022 CLINICAL DATA:  Right arm pain EXAM: RIGHT SHOULDER - 2+ VIEW COMPARISON:  None. FINDINGS: No fracture or dislocation is seen. The joint spaces are preserved. The visualized soft tissues are unremarkable. Visualized right lung is clear. IMPRESSION: Negative. Electronically Signed   By: Julian Hy M.D.   On: 02/06/2022 02:50    Procedures Procedures    Medications Ordered in ED Medications  fluorescein ophthalmic strip 1 strip (1 strip Both Eyes Given 02/06/22 0030)  tetracaine (PONTOCAINE) 0.5 % ophthalmic solution 2 drop (2 drops Both Eyes Given 02/06/22 0030)  HYDROcodone-acetaminophen (NORCO/VICODIN) 5-325 MG per tablet 1 tablet (1 tablet Oral Given 02/06/22 0258)    ED Course/ Medical Decision Making/ A&P                           Medical Decision Making Amount and/or Complexity of Data Reviewed Radiology: ordered.  Risk Prescription drug management.   This patient presents to the ED for concern of shoulder strain, this involves an extensive number of treatment options, and is a complaint that carries with it a high risk of complications and morbidity.  The differential diagnosis includes muscle strain, humerus fracture, shoulder dislocation,  Comorbidities that complicate the patient evaluation: Patients presentation  is complicated by their history of breast cancer    Additional history obtained: Records reviewed previous consult notes noted   Imaging Studies ordered: I ordered imaging studies including X-ray right I independently visualized and interpreted imaging which showed no acute findings I agree with the radiologist interpretation   Medicines ordered and prescription drug management: I ordered medication including Vicodin for pain Reevaluation of the patient after these medicines showed that the patient    improved  Reevaluation: After the interventions noted above, I reevaluated the patient and found that they have :improved  Complexity of problems addressed: Patients presentation is most consistent with  acute complicated illness/injury requiring diagnostic workup      Disposition: After consideration of the diagnostic results and the patients response to treatment,  I feel that the patent would benefit from discharge .   For shoulder pain, this been ongoing for 3 months.  X-ray reviewed  and is negative.  Will refer to orthopedics.  Patient accidentally sprayed her right eye with a muscle spray that was with freeze therapy No obvious injury was noted to the eye on my exam.  pH was appropriate. She does not wear corrective lenses.  She did have mild disruption of visual acuity but she reports blurred vision OD was flushed out with a Morgan lens and patient is improved. Will d/c home        Final Clinical Impression(s) / ED Diagnoses Final diagnoses:  Strain of right shoulder, initial encounter  Right eye injury, initial encounter    Rx / DC Orders ED Discharge Orders     None         Ripley Fraise, MD 02/06/22 [phone]

## 2022-02-06 NOTE — ED Triage Notes (Signed)
Pt c/o muscle strain on right side from should down her arm for the past 3 months. Pt also c/o right eye burning due to getting a OTC medicated spray in the right eye this afternoon.

## 2022-02-07 ENCOUNTER — Ambulatory Visit
Admission: RE | Admit: 2022-02-07 | Discharge: 2022-02-07 | Disposition: A | Discharge status: Home / Self Care | Payer: 59 | Source: Ambulatory Visit | Attending: Radiation Oncology | Admitting: Radiation Oncology

## 2022-02-07 ENCOUNTER — Other Ambulatory Visit: Payer: Self-pay

## 2022-02-07 DIAGNOSIS — Z0000 Encounter for general adult medical examination without abnormal findings: Secondary | ICD-10-CM | POA: Diagnosis not present

## 2022-02-10 ENCOUNTER — Other Ambulatory Visit: Payer: Self-pay

## 2022-02-10 ENCOUNTER — Ambulatory Visit
Admission: RE | Admit: 2022-02-10 | Discharge: 2022-02-10 | Disposition: A | Discharge status: Home / Self Care | Payer: 59 | Source: Ambulatory Visit | Attending: Radiation Oncology | Admitting: Radiation Oncology

## 2022-02-10 DIAGNOSIS — Z0000 Encounter for general adult medical examination without abnormal findings: Secondary | ICD-10-CM | POA: Diagnosis not present

## 2022-02-10 DIAGNOSIS — D0512 Intraductal carcinoma in situ of left breast: Secondary | ICD-10-CM

## 2022-02-10 MED ORDER — RADIAPLEXRX EX GEL: Freq: Once | CUTANEOUS | Status: AC

## 2022-02-10 NOTE — Progress Notes (Signed)
Pt here for patient teaching. Pt given Radiation and You booklet, skin care instructions, and Radiaplex gel. Reviewed areas of pertinence such as fatigue, hair loss, nausea and vomiting, skin changes, breast tenderness, breast swelling, cough, shortness of breath, earaches, and taste changes. Pt able to give teach back of to pat skin, use unscented/gentle soap, and drink plenty of water, apply Radiaplex bid, avoid applying anything to skin within 4 hours of treatment, avoid wearing an under wire bra, and to use an electric razor if they must shave. Pt verbalizes understanding of information given and will contact nursing with any questions or concerns.     [email or web address]

## 2022-02-11 ENCOUNTER — Ambulatory Visit
Admission: RE | Admit: 2022-02-11 | Discharge: 2022-02-11 | Disposition: A | Discharge status: Home / Self Care | Payer: 59 | Source: Ambulatory Visit | Attending: Radiation Oncology | Admitting: Radiation Oncology

## 2022-02-11 DIAGNOSIS — Z0000 Encounter for general adult medical examination without abnormal findings: Secondary | ICD-10-CM | POA: Diagnosis not present

## 2022-02-12 ENCOUNTER — Other Ambulatory Visit: Payer: Self-pay

## 2022-02-12 ENCOUNTER — Encounter: Payer: Self-pay | Admitting: Orthopedic Surgery

## 2022-02-12 ENCOUNTER — Encounter (HOSPITAL_COMMUNITY): Payer: Self-pay | Admitting: *Deleted

## 2022-02-12 ENCOUNTER — Ambulatory Visit
Admission: RE | Admit: 2022-02-12 | Discharge: 2022-02-12 | Disposition: A | Discharge status: Home / Self Care | Payer: 59 | Source: Ambulatory Visit | Attending: Radiation Oncology | Admitting: Radiation Oncology

## 2022-02-12 ENCOUNTER — Ambulatory Visit (INDEPENDENT_AMBULATORY_CARE_PROVIDER_SITE_OTHER): Payer: 59 | Admitting: Orthopedic Surgery

## 2022-02-12 VITALS — BP 139/82 | HR 91 | Ht 61.0 in | Wt 246.0 lb

## 2022-02-12 DIAGNOSIS — M7581 Other shoulder lesions, right shoulder: Secondary | ICD-10-CM

## 2022-02-12 DIAGNOSIS — Z0000 Encounter for general adult medical examination without abnormal findings: Secondary | ICD-10-CM | POA: Diagnosis not present

## 2022-02-12 NOTE — Patient Instructions (Signed)

## 2022-02-13 ENCOUNTER — Ambulatory Visit: Payer: 59

## 2022-02-13 ENCOUNTER — Encounter: Payer: Self-pay | Admitting: Orthopedic Surgery

## 2022-02-13 NOTE — Progress Notes (Signed)
New Patient Visit  Assessment: Tammy Calderon is a 56 y.o. LHD female with the following: 1. Right rotator cuff tendonitis  Plan: Mrs. Tindel has right shoulder pain, which worsens at night.  It is been ongoing for several months.  She is difficulty with overhead motion.  Currently, she is unable to get her arm above the level of her shoulder.  No specific injury.  We discussed multiple treatment options, and she is interested in proceeding with an injection.  Depending on the efficacy of the injection, we may have to proceed with an MRI to evaluate for further pathology, given her limited range of motion at this time.  Procedure note injection - Right shoulder    Verbal consent was obtained to inject the right shoulder, subacromial space Timeout was completed to confirm the site of injection.   The skin was prepped with alcohol and ethyl chloride was sprayed at the injection site.  A 21-gauge needle was used to inject 40 mg of Depo-Medrol and 1% lidocaine (3 cc) into the subacromial space of the right shoulder using a posterolateral approach.  There were no complications.  A sterile bandage was applied.    Follow-up: Return if symptoms worsen or fail to improve.  Subjective:  Chief Complaint  Patient presents with   Shoulder Pain    Rt shoulder pain for 4-5 months getting worse over past month. Pt has recently gone through biopsies and treatment for cancer and has been using Rt arm more     History of Present Illness: Tammy Calderon is a 56 y.o. female who presents for evaluation of right shoulder pain.  She is having pain and decreased mobility in her right shoulder.  Pain is in the posterior and lateral right shoulder.  She has difficulty with overhead motion.  Pain gets worse at night.  Pain medications are not improving her symptoms.  She is never had an injection.  No physical therapy.  Of note, she has undergone treatment for breast cancer, and has been more reliant on her right  arm.   Review of Systems: No fevers or chills No numbness or tingling No chest pain No shortness of breath No bowel or bladder dysfunction No GI distress No headaches   Medical History:  Past Medical History:  Diagnosis Date   Asthma    Bell's palsy    Fibromyalgia    Hypertension    PONV (postoperative nausea and vomiting)     Past Surgical History:  Procedure Laterality Date   BREAST BIOPSY WITH RADIO FREQUENCY LOCALIZER Left 12/27/2021   Procedure: EXCISIONAL BREAST BIOPSY WITH RADIO FREQUENCY LOCALIZER X2;  Surgeon: Virl Cagey, MD;  Location: [facility];  Service: General;  Laterality: Left;   NASAL SEPTUM SURGERY     TUBAL LIGATION      Family History  Problem Relation Age of Onset   Uterine cancer Mother    Social History   Tobacco Use   Smoking status: Never   Smokeless tobacco: Never  Vaping Use   Vaping Use: Never used  Substance Use Topics   Alcohol use: No   Drug use: Never    Allergies  Allergen Reactions   Misc. Sulfonamide Containing Compounds Anaphylaxis   Sulfa Antibiotics Anaphylaxis   Cymbalta [Duloxetine Hcl]     sedated   Lyrica [Pregabalin]     drowsy   Neurontin [Gabapentin]     drowsy    No outpatient medications have been marked as taking for the 02/12/22 encounter (  Office Visit) with Mordecai Rasmussen, MD.    Objective: BP 139/82    Pulse 91    Ht 5\' 1"  (1.549 m)    Wt 246 lb (111.6 kg)    LMP 10/06/2011    BMI 46.48 kg/m   Physical Exam:  General: Alert and oriented. and No acute distress. Gait: Normal gait.  Right shoulder without deformity.  Forward flexion to 90 degrees.  Abduction to 80 degrees.  Internal rotation to her back pocket.  Pain in the empty can testing position.  Positive drop arm test.  Passive forward flexion to 130 degrees before it is too much pain.  Fingers are warm and well-perfused.  IMAGING: I personally reviewed images previously obtained from the ED  X-rays from the ED are without acute  injury.  No glenohumeral joint space narrowing.  No proximal humeral migration.  Mild degenerative changes at the [facility] joint.   New Medications:  No orders of the defined types were placed in this encounter.     Mordecai Rasmussen, MD  02/13/2022 9:29 AM

## 2022-02-14 ENCOUNTER — Ambulatory Visit: Payer: 59

## 2022-02-14 ENCOUNTER — Encounter: Payer: Self-pay | Admitting: Gynecologic Oncology

## 2022-02-16 NOTE — Progress Notes (Signed)
GYNECOLOGIC ONCOLOGY NEW PATIENT CONSULTATION   Patient Name: Tammy Calderon  Patient Age: 56 y.o. Date of Service: 02/17/22 Referring Provider: Dr. Eppie Gibson  Primary Care Provider: Kathyrn Drown, MD Consulting Provider: Jeral Pinch, MD   Assessment/Plan:  Postmenopausal patient with postmenopausal bleeding.   We reviewed differential diagnosis for PMB including benign, premalignant and malignant causes. I recommended biopsy which was performed today. I will call her once results are back later this week. We discussed potential biopsy results. Findings today, including endocervical polyp, were discussed.  If benign, we will discuss if any further treatment is indicated (such as a D&C for endometrial polyp). If premalignant or malignant, we discussed plan for definitive surgery versus other treatment (depending on cervical vs endometrial origin).  We discussed the limitations of CT imaging for evaluation of pelvic structures. I recommended that we proceed with pelvic ultrasound.   The patient has not had a pap test in >5 years. Pap and HPV testing was recommended for routine health maintenance.  A copy of this note was sent to the patient's referring provider.   55 minutes of total time was spent for this patient encounter, including preparation, face-to-face counseling with the patient and coordination of care, and documentation of the encounter.   Jeral Pinch, MD  Division of Gynecologic Oncology  Department of Obstetrics and Gynecology  [facility]  ___________________________________________  Chief Complaint: Chief Complaint  Patient presents with   PMB (postmenopausal bleeding)    History of Present Illness:  Tammy Calderon is a 56 y.o. y.o. female who is seen in consultation at the request of Dr. Isidore Moos for an evaluation of postmenopausal bleeding.  She has ER+ DCIS of the left breast. She underwent lumpectomy on 12/27/21. Plan is for  adjuvant treatment with RT and anti-estrogen therapy (AI).  Patient started anastrozole about a month ago with plan for 5 years of treatment.  Patient was seen by urology in the setting of bleeding that was thought to be hematuria.  She underwent CT of the abdomen and pelvis on 11/04/21 for evaluation of 2-3 months of gross hematuria and left sided flank pain. CT showed no evidence of hydronephrosis, renal, ureteral, or bladder calculi, or solid enhancing renal masses. A nonspecific nodular enhancement was however appreciated along the fundal portion of the endometrium, possibly reflecting a uterine fibroid. [name] was also appreciated about the mesentery, with prominent mesenteric lymph nodes measuring up to 4 mm. 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  Cystoscopy was also performed showing no obvious cause for hematuria.  Referral was made for gynecology, but the patient was never scheduled.  Today, patient endorses menopause at age 82, last year, about 12 months ago, she began having vaginal spotting.  This was initially intermittent and she could go a week between episodes.  More recently, her bleeding has become heavier, which coincided with receiving a steroid injection for rotator cuff injury.  She now wears a pad and changes them once or twice a day.  You there is usually just a streak on her pad and she denies that they are fully saturated.  With her last episode of bleeding, she needed 2 pads a day although pads were still not saturated.  She endorses occasional pelvic pain, denies needing to take any medication for this pain.  Notes appetite has been up and down, had some nausea after her recent breast surgery.  Denies any recent weight changes.  Reports normal bowel function.  Has some urge urinary incontinence.  Patient works at the [employer] in [employer] and answering [employer].  She is currently off work during treatment.    PAST MEDICAL HISTORY:  Past Medical History:  Diagnosis Date   Asthma    uses albuterol approx 1-2x a week   Bell's palsy    resolved   Breast cancer (HCC)    Fibromyalgia    Hypertension    PONV (postoperative nausea and vomiting)      PAST SURGICAL HISTORY:  Past Surgical History:  Procedure Laterality Date   BREAST BIOPSY WITH RADIO FREQUENCY LOCALIZER Left 12/27/2021   Procedure: EXCISIONAL BREAST BIOPSY WITH RADIO FREQUENCY LOCALIZER X2;  Surgeon: Virl Cagey, MD;  Location: [facility];  Service: General;  Laterality: Left;   NASAL SEPTUM SURGERY     TUBAL LIGATION      OB/GYN HISTORY:  OB History  Gravida Para Term Preterm AB Living  _0 SAB IAB Ectopic Multiple Live Births               # Outcome Date GA Lbr Len/2nd Weight Sex Delivery Anes PTL Lv  2 Para           1 Para             Patient's last menstrual period was 10/06/2011.  Age at menarche: 35 Age at menopause: 55 Hx of HRT: Denies, is on anastrozole now Hx of STDs: Denies Last pap: Thinks at least 5 [facility] History of abnormal pap smears: Denies  SCREENING STUDIES:  Last mammogram: 09/2021  Last colonoscopy: Has never had Last bone mineral density: 01/2022  MEDICATIONS: Outpatient Encounter Medications as of 02/17/2022  Medication Sig   albuterol (VENTOLIN HFA) 108 (90 Base) MCG/ACT inhaler Inhale 2 puffs into the lungs every 6 (six) hours as needed for wheezing.   anastrozole (ARIMIDEX) 1 MG tablet Take 1 tablet (1 mg total) by mouth daily.   ibuprofen (ADVIL) 200 MG tablet Take 200 mg by mouth every 8 (eight) hours as needed for moderate pain.   lisinopril-hydrochlorothiazide (ZESTORETIC) 20-12.5 MG tablet Take 1 tablet by mouth once daily   oxycodone (OXY-IR) 5 MG capsule Take 1 capsule (5 mg total) by mouth every 4 (four) hours as needed.   furosemide (LASIX) 20 MG tablet 1 every morning as needed significant swelling not for frequent use (Patient not  taking: Reported on 02/14/2022)   gabapentin (NEURONTIN) 100 MG capsule Take 1 capsule (100 mg total) by mouth 3 (three) times daily.   ondansetron (ZOFRAN) 4 MG tablet Take 1 tablet (4 mg total) by mouth every 8 (eight) hours as needed. (Patient not taking: Reported on 02/14/2022)   No facility-administered encounter medications on file as of 02/17/2022.    ALLERGIES:  Allergies  Allergen Reactions   Misc. Sulfonamide Containing Compounds Anaphylaxis   Sulfa Antibiotics Anaphylaxis   Cymbalta [Duloxetine Hcl]     sedated   Lyrica [Pregabalin]     drowsy   Neurontin [Gabapentin]     drowsy     FAMILY HISTORY:  Family History  Adopted: Yes  Problem Relation Age of Onset   Uterine cancer Mother      SOCIAL HISTORY:  Social Connections: Not on file    REVIEW OF SYSTEMS:  + Fatigue, hot flashes, vaginal bleeding, anxiety. Denies appetite changes, fevers, chills, unexplained weight changes. Denies hearing loss,  neck lumps or masses, mouth sores, ringing in ears or voice changes. Denies cough or wheezing.  Denies shortness of breath. Denies chest pain or palpitations. Denies leg swelling. Denies abdominal distention, pain, blood in stools, constipation, diarrhea, nausea, vomiting, or early satiety. Denies pain with intercourse, dysuria, frequency, hematuria or incontinence. Denies pelvic pain or vaginal discharge.   Denies joint pain, back pain or muscle pain/cramps. Denies itching, rash, or wounds. Denies dizziness, headaches, numbness or seizures. Denies swollen lymph nodes or glands, denies easy bruising or bleeding. Denies depression, confusion, or decreased concentration.  Physical Exam:  Vital Signs for this encounter:  Blood pressure 118/65, pulse 71, temperature 98.7 F (37.1 C), temperature source Oral, resp. rate 18, height 5' 0.83" (1.545 m), weight 246 lb 1.6 oz (111.6 kg), last menstrual period 10/06/2011, SpO2 100 %. Body mass index is 46.77 kg/m. General:  Alert, oriented, no acute distress.  HEENT: Normocephalic, atraumatic. Sclera anicteric.  Chest: Clear to auscultation bilaterally. No wheezes, rhonchi, or rales. Cardiovascular: Regular rate and rhythm, no murmurs, rubs, or gallops.  Abdomen: Obese. Normoactive bowel sounds. Soft, nondistended, nontender to palpation. No masses or hepatosplenomegaly appreciated. No palpable fluid wave.  Extremities: Grossly normal range of motion. Warm, well perfused. No edema bilaterally.  Skin: No rashes or lesions.  Lymphatics: No cervical, supraclavicular, or inguinal adenopathy.  GU:  Normal external female genitalia. No lesions. No discharge or bleeding.             Bladder/urethra:  No lesions or masses, well supported bladder             Vagina: Well rugated, no lesions or masses.             Cervix: Cervix itself is normal in appearance, although there is a 1-2 cm prolapsing endocervical appearing polyp.  Glandular tissue bleeds some with manipulation and after polyp removal.  Pap test collected             Uterus: Small, mobile, no parametrial involvement or nodularity.  Cervix itself is not firm or nodular.             Adnexa: No masses appreciated.  Rectal: Deferred  Endometrial biopsy and endocervical biopsy procedure Preoperative diagnosis: Postmenopausal bleeding Postoperative diagnosis: Same as above, endocervical polyp Physician: Berline Lopes MD Estimated blood loss: 20 cc Specimens: Endocervical polyp, endometrial biopsy Procedure: After the procedure was discussed with the patient in detail including risks and benefits, the patient gave verbal consent.  She was then placed in dorsolithotomy position.  A speculum was placed in the vagina and the cervix was well visualized.  Pap and HPV testing were performed.  The cervix was then cleansed with Betadine x3.  Ring forceps were then used to remove the endocervical polyp by twisting it on its stalk.  Single-tooth tenaculum was then placed on the  anterior lip of the cervix.  Endometrial Pipelle was passed to a depth of 6 cm before resistance was met.  2 passes were performed with mostly blood, minimal tissue noted.  This was all placed in formalin to be sent to pathology.  Hemostasis was assured with pressure.  All instruments were removed from the vagina.  Overall the patient tolerated the procedure well.  LABORATORY AND RADIOLOGIC DATA:  Outside medical records were reviewed to synthesize the above history, along with the history and physical obtained during the visit.   Lab Results  Component Value Date   WBC 9.3 01/01/2022   HGB 12.3 01/01/2022   HCT 38.9  01/01/2022   PLT 248 01/01/2022   GLUCOSE 100 (H) 01/01/2022   CHOL 298 (H) 09/19/2021   TRIG 104 09/19/2021   HDL 66 09/19/2021   LDLCALC 211 (H) 09/19/2021   ALT 53 (H) 09/19/2021   AST 42 (H) 09/19/2021   NA 138 01/01/2022   K 3.8 01/01/2022   CL 102 01/01/2022   CREATININE 0.65 01/01/2022   BUN 18 01/01/2022   CO2 28 01/01/2022   TSH 2.384 04/13/2014   CT A/P for hematuria work-up on 11/04/21: 1. No hydronephrosis. No renal, ureteral, or bladder calculi visualized. No solid enhancing renal mass. 2. Nodular enhancement along the fundal portion of the endometrium, nonspecific possibly reflecting a uterine fibroid. Consider further evaluation with dedicated pelvic ultrasound. 3. [name] of the mesentery with prominent mesenteric lymph nodes measuring up to 4 mm. 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 4. Cholelithiasis without findings of acute cholecystitis. 5. Colonic diverticulosis without findings of acute diverticulitis. 6. Small hiatal hernia. 7. Aortic Atherosclerosis (ICD10-I70.0).

## 2022-02-17 ENCOUNTER — Ambulatory Visit
Admission: RE | Admit: 2022-02-17 | Discharge: 2022-02-17 | Disposition: A | Discharge status: Home / Self Care | Payer: 59 | Source: Ambulatory Visit | Attending: Radiation Oncology | Admitting: Radiation Oncology

## 2022-02-17 ENCOUNTER — Encounter: Payer: Self-pay | Admitting: Gynecologic Oncology

## 2022-02-17 ENCOUNTER — Other Ambulatory Visit (HOSPITAL_COMMUNITY)
Admission: RE | Admit: 2022-02-17 | Discharge: 2022-02-17 | Disposition: A | Discharge status: Home / Self Care | Payer: 59 | Source: Ambulatory Visit | Attending: Gynecologic Oncology | Admitting: Gynecologic Oncology

## 2022-02-17 ENCOUNTER — Other Ambulatory Visit: Payer: Self-pay

## 2022-02-17 ENCOUNTER — Inpatient Hospital Stay: Payer: 59 | Attending: Gynecologic Oncology | Admitting: Gynecologic Oncology

## 2022-02-17 VITALS — BP 118/65 | HR 71 | Temp 98.7°F | Resp 18 | Ht 60.83 in | Wt 246.1 lb

## 2022-02-17 DIAGNOSIS — Z0000 Encounter for general adult medical examination without abnormal findings: Secondary | ICD-10-CM | POA: Insufficient documentation

## 2022-02-17 DIAGNOSIS — Z510 Encounter for antineoplastic radiation therapy: Secondary | ICD-10-CM | POA: Insufficient documentation

## 2022-02-17 DIAGNOSIS — D0512 Intraductal carcinoma in situ of left breast: Secondary | ICD-10-CM

## 2022-02-17 DIAGNOSIS — N841 Polyp of cervix uteri: Secondary | ICD-10-CM | POA: Insufficient documentation

## 2022-02-17 DIAGNOSIS — Z79811 Long term (current) use of aromatase inhibitors: Secondary | ICD-10-CM | POA: Insufficient documentation

## 2022-02-17 DIAGNOSIS — Z79899 Other long term (current) drug therapy: Secondary | ICD-10-CM | POA: Insufficient documentation

## 2022-02-17 DIAGNOSIS — N3941 Urge incontinence: Secondary | ICD-10-CM | POA: Insufficient documentation

## 2022-02-17 DIAGNOSIS — N950 Postmenopausal bleeding: Secondary | ICD-10-CM

## 2022-02-17 DIAGNOSIS — Z170 Estrogen receptor positive status [ER+]: Secondary | ICD-10-CM | POA: Insufficient documentation

## 2022-02-17 DIAGNOSIS — I10 Essential (primary) hypertension: Secondary | ICD-10-CM | POA: Insufficient documentation

## 2022-02-17 DIAGNOSIS — M797 Fibromyalgia: Secondary | ICD-10-CM | POA: Insufficient documentation

## 2022-02-17 NOTE — Patient Instructions (Signed)
It was a pleasure meeting you today.  I will call you when I have your biopsy results back.  This typically takes a couple of days.  The Pap test results will take about a week.  We will also get you scheduled for a pelvic ultrasound.  This will be to better assess the lining of your uterus as well as the possible fibroid.  Depending on the biopsy results, we may need to talk about additional surgery.  This could be outpatient surgery to do further sampling or remove tissue from the inside of your uterus.  This also may mean more definitive surgery with a hysterectomy.

## 2022-02-18 ENCOUNTER — Ambulatory Visit
Admission: RE | Admit: 2022-02-18 | Discharge: 2022-02-18 | Disposition: A | Discharge status: Home / Self Care | Payer: 59 | Source: Ambulatory Visit | Attending: Radiation Oncology | Admitting: Radiation Oncology

## 2022-02-18 DIAGNOSIS — Z0000 Encounter for general adult medical examination without abnormal findings: Secondary | ICD-10-CM | POA: Diagnosis not present

## 2022-02-19 ENCOUNTER — Other Ambulatory Visit: Payer: Self-pay

## 2022-02-19 ENCOUNTER — Telehealth: Payer: Self-pay

## 2022-02-19 ENCOUNTER — Ambulatory Visit
Admission: RE | Admit: 2022-02-19 | Discharge: 2022-02-19 | Disposition: A | Discharge status: Home / Self Care | Payer: 59 | Source: Ambulatory Visit | Attending: Radiation Oncology | Admitting: Radiation Oncology

## 2022-02-19 DIAGNOSIS — D0512 Intraductal carcinoma in situ of left breast: Secondary | ICD-10-CM | POA: Insufficient documentation

## 2022-02-19 LAB — SURGICAL PATHOLOGY

## 2022-02-19 NOTE — Telephone Encounter (Signed)
Spoke with Ms. Reinders this afternoon. Advised patient that Dr. Berline Lopes would like to arrange an office appointment with her to discuss surgery. She has been scheduled for surgery March 29th however this date can always be adjusted. Patient may receive a phone call from the hospital to setup a pre-op appointment as well. Patient will need to see Dr. Berline Lopes before having her pre-op appointment with the hospital.  ? ?Office visit scheduled with Dr. Berline Lopes 03/07/22 at 1 pm. Patient to see Joylene John, NP after her visit with Dr. Berline Lopes. ? ?Patient verbalized understanding and is in agreement of appointment date and time. Advised patient to call with any questions or concerns.  ?

## 2022-02-19 NOTE — Telephone Encounter (Signed)
Called and spoke with patient directly. Provided Dr. Charisse March office number and Elmo Putt (GYN Navigator's) office number. Patient verbalized appreciation of call and denied any other needs at this time ?

## 2022-02-20 ENCOUNTER — Ambulatory Visit
Admission: RE | Admit: 2022-02-20 | Discharge: 2022-02-20 | Disposition: A | Discharge status: Home / Self Care | Payer: 59 | Source: Ambulatory Visit | Attending: Radiation Oncology | Admitting: Radiation Oncology

## 2022-02-20 ENCOUNTER — Telehealth: Payer: Self-pay | Admitting: *Deleted

## 2022-02-20 DIAGNOSIS — D0512 Intraductal carcinoma in situ of left breast: Secondary | ICD-10-CM | POA: Diagnosis not present

## 2022-02-20 NOTE — Telephone Encounter (Signed)
Spoke with Reino Kent at [place] office regarding Anastrozole recommendation pre and post surgery.  Per Reino Kent, Dr.Katragadda stated pt can take medication as prescribed and no need to stop before or after surgery.  ?

## 2022-02-21 ENCOUNTER — Ambulatory Visit
Admission: RE | Admit: 2022-02-21 | Discharge: 2022-02-21 | Disposition: A | Discharge status: Home / Self Care | Payer: 59 | Source: Ambulatory Visit | Attending: Radiation Oncology | Admitting: Radiation Oncology

## 2022-02-21 ENCOUNTER — Other Ambulatory Visit: Payer: Self-pay

## 2022-02-21 DIAGNOSIS — D0512 Intraductal carcinoma in situ of left breast: Secondary | ICD-10-CM | POA: Diagnosis not present

## 2022-02-24 ENCOUNTER — Ambulatory Visit
Admission: RE | Admit: 2022-02-24 | Discharge: 2022-02-24 | Disposition: A | Discharge status: Home / Self Care | Payer: 59 | Source: Ambulatory Visit | Attending: Radiation Oncology | Admitting: Radiation Oncology

## 2022-02-24 ENCOUNTER — Other Ambulatory Visit: Payer: Self-pay

## 2022-02-24 DIAGNOSIS — D0512 Intraductal carcinoma in situ of left breast: Secondary | ICD-10-CM | POA: Diagnosis not present

## 2022-02-24 LAB — CYTOLOGY - PAP
Comment: NEGATIVE
Diagnosis: UNDETERMINED — AB
High risk HPV: NEGATIVE

## 2022-02-24 MED ORDER — RADIAPLEXRX EX GEL: Freq: Once | CUTANEOUS | Status: AC

## 2022-02-25 ENCOUNTER — Ambulatory Visit: Payer: 59

## 2022-02-25 ENCOUNTER — Ambulatory Visit
Admission: RE | Admit: 2022-02-25 | Discharge: 2022-02-25 | Disposition: A | Discharge status: Home / Self Care | Payer: 59 | Source: Ambulatory Visit | Attending: Radiation Oncology | Admitting: Radiation Oncology

## 2022-02-25 DIAGNOSIS — D0512 Intraductal carcinoma in situ of left breast: Secondary | ICD-10-CM | POA: Diagnosis not present

## 2022-02-26 ENCOUNTER — Ambulatory Visit: Payer: 59

## 2022-02-26 ENCOUNTER — Ambulatory Visit
Admission: RE | Admit: 2022-02-26 | Discharge: 2022-02-26 | Disposition: A | Discharge status: Home / Self Care | Payer: 59 | Source: Ambulatory Visit | Attending: Radiation Oncology | Admitting: Radiation Oncology

## 2022-02-26 ENCOUNTER — Other Ambulatory Visit: Payer: Self-pay

## 2022-02-26 DIAGNOSIS — D0512 Intraductal carcinoma in situ of left breast: Secondary | ICD-10-CM | POA: Diagnosis not present

## 2022-02-27 ENCOUNTER — Encounter: Payer: 59 | Admitting: General Surgery

## 2022-02-27 ENCOUNTER — Ambulatory Visit: Payer: 59

## 2022-02-27 ENCOUNTER — Telehealth (HOSPITAL_COMMUNITY): Payer: Self-pay | Admitting: Family Medicine

## 2022-02-27 ENCOUNTER — Ambulatory Visit
Admission: RE | Admit: 2022-02-27 | Discharge: 2022-02-27 | Disposition: A | Discharge status: Home / Self Care | Payer: 59 | Source: Ambulatory Visit | Attending: Radiation Oncology | Admitting: Radiation Oncology

## 2022-02-27 DIAGNOSIS — D0512 Intraductal carcinoma in situ of left breast: Secondary | ICD-10-CM | POA: Diagnosis not present

## 2022-02-27 NOTE — Telephone Encounter (Signed)
Patient phone, stated she is having a hysterectomy next week and will not be able to come to the scheduled appointments. Pt stated after she is recovered from surgery she will phone back to reschedule. ?

## 2022-02-28 ENCOUNTER — Telehealth: Payer: Self-pay | Admitting: *Deleted

## 2022-02-28 ENCOUNTER — Ambulatory Visit (HOSPITAL_COMMUNITY)
Admission: RE | Admit: 2022-02-28 | Discharge: 2022-02-28 | Disposition: A | Discharge status: Home / Self Care | Payer: 59 | Source: Ambulatory Visit | Attending: Gynecologic Oncology | Admitting: Gynecologic Oncology

## 2022-02-28 ENCOUNTER — Ambulatory Visit: Payer: 59

## 2022-02-28 ENCOUNTER — Encounter: Payer: 59 | Admitting: General Surgery

## 2022-02-28 ENCOUNTER — Ambulatory Visit
Admission: RE | Admit: 2022-02-28 | Discharge: 2022-02-28 | Disposition: A | Discharge status: Home / Self Care | Payer: 59 | Source: Ambulatory Visit | Attending: Radiation Oncology | Admitting: Radiation Oncology

## 2022-02-28 ENCOUNTER — Other Ambulatory Visit: Payer: Self-pay

## 2022-02-28 DIAGNOSIS — N950 Postmenopausal bleeding: Secondary | ICD-10-CM | POA: Diagnosis not present

## 2022-02-28 NOTE — Telephone Encounter (Signed)
Attempted to review results of ultrasound with pt. Unable to reach pt. LVM for a call back.  ?

## 2022-02-28 NOTE — Telephone Encounter (Signed)
Received returned phone call from pt this afternoon regarding her ultrasound results.  Informed pt that the ultrasound showed thickened lining which we know about and simple cyst on the right ovary which will be removed during surgery.  Pt verbalized understanding and had no further questions.  ?

## 2022-03-03 ENCOUNTER — Other Ambulatory Visit: Payer: Self-pay

## 2022-03-03 ENCOUNTER — Ambulatory Visit: Payer: 59

## 2022-03-03 ENCOUNTER — Encounter: Payer: Self-pay | Admitting: Radiation Oncology

## 2022-03-03 ENCOUNTER — Ambulatory Visit
Admission: RE | Admit: 2022-03-03 | Discharge: 2022-03-03 | Disposition: A | Discharge status: Home / Self Care | Payer: 59 | Source: Ambulatory Visit | Attending: Radiation Oncology | Admitting: Radiation Oncology

## 2022-03-03 DIAGNOSIS — D0512 Intraductal carcinoma in situ of left breast: Secondary | ICD-10-CM | POA: Diagnosis not present

## 2022-03-04 ENCOUNTER — Ambulatory Visit: Payer: 59

## 2022-03-05 ENCOUNTER — Encounter: Payer: Self-pay | Admitting: General Surgery

## 2022-03-05 ENCOUNTER — Ambulatory Visit (INDEPENDENT_AMBULATORY_CARE_PROVIDER_SITE_OTHER): Payer: 59 | Admitting: General Surgery

## 2022-03-05 ENCOUNTER — Ambulatory Visit (HOSPITAL_COMMUNITY): Payer: 59 | Admitting: Physical Therapy

## 2022-03-05 ENCOUNTER — Other Ambulatory Visit: Payer: Self-pay

## 2022-03-05 VITALS — BP 113/72 | HR 64 | Temp 98.4°F | Resp 14 | Ht 61.0 in | Wt 245.0 lb

## 2022-03-05 DIAGNOSIS — L580 Acute radiodermatitis: Secondary | ICD-10-CM

## 2022-03-05 DIAGNOSIS — M792 Neuralgia and neuritis, unspecified: Secondary | ICD-10-CM

## 2022-03-05 DIAGNOSIS — D0512 Intraductal carcinoma in situ of left breast: Secondary | ICD-10-CM

## 2022-03-05 MED ORDER — HYDROCORTISONE VALERATE 0.2 % EX CREA: 1.0000 "application " | TOPICAL_CREAM | Freq: Two times a day (BID) | CUTANEOUS | 0 refills | Status: DC

## 2022-03-05 MED ORDER — SKINTEGRITY HYDROGEL EX GEL: 1.0000 "application " | Freq: Two times a day (BID) | CUTANEOUS | 3 refills | Status: DC

## 2022-03-05 MED ORDER — DOXYCYCLINE HYCLATE 50 MG PO CAPS: 50.0000 mg | ORAL_CAPSULE | Freq: Two times a day (BID) | ORAL | 0 refills | Status: AC

## 2022-03-05 NOTE — Patient Instructions (Addendum)
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  ? ?Apply the hydrocortisone on the top part of the breast that is lightly pink but is not sloughing skin.  ?Do not apply to the area under the breast or the arm pit. ? ?Cleanse the skin under the breast and armpit with saline daily. ?Apply hydrogel ointment (can be over the counter ) and mepliex type dressing to the area under the breast and the armpit. ?Take the doxycycline. ? ? ?Will notify Dr. Delton Coombes and let the Radiation team know about the dermatitis.  ?Call if you do not hear from the Radiation team or things are getting worse.  ?

## 2022-03-05 NOTE — Progress Notes (Signed)
[facility] ? ?Doing better from the shooting pain from the breast surgery and only using gabapentin as needed at this point she reports. ? ?She finished her radiation on Monday and saw the Radiation Oncology team. She showed them some dermatitis issues and they were going to prescribe her something but she has a sulfa allergy she says. ? ?BP 113/72   Pulse 64   Temp 98.4 ?F (36.9 ?C) (Other (Comment))   Resp 14   Ht '5\' 1"'$  (1.549 m)   Wt 245 lb (111.1 kg)   LMP 10/06/2011   SpO2 97%   BMI 46.29 kg/m?  ?Desquamation of the skin under the breast and axilla with serous drainage and pain, mild erythema starting to spread out ?Upper breast with mild pink edema at incision area ? ? ? ? ? ? ? ?Patient s/p left partial mastectomy and sentinel node for DCIS.She has just completed her radiation. 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 ? ?She feels like this is worse from Monday. I have reviewed guidelines on this and grading. ? ?I would say she is Grade II under her axilla and breast given the desquamation of the creases and folds.  ? ?I have prescribed hydrocortisone valerate cream 0.2% for the top of the breast / pink grade I area.  ? ?I have recommended hydrogel and a mepilex dressing to the area under the breast and the axilla. We have given her supplies.  ? ?Discussed not using any deodorant or perfume type soap or lotion at this time. Discussed saline cleanse daily to the areas and wearing loose fitting clothes.  ? ?Given that the redness is extending out and she feels like it is worse, I have prescribed doxycycline to ensure this is not worsening to a staph skin infection.  ? ?Will notify Dr. Delton Coombes and let the Radiation team know about the dermatitis.  ? ?Call if you do not hear from the Radiation team or things are getting worse.  ? ?Will also let Dr. Delton Coombes know about the  endometrial cancer. ? ?PRN follow up  ? ?Future Appointments  ?Date Time Provider [place]  ?03/07/2022  1:00 PM [name], MD [facility] None  ?03/07/2022  1:30 PM Cross, Carollee Massed, NP [facility] None  ?03/11/2022  1:00 PM [facility] PAT 3 [facility] None  ?04/01/2022  2:50 PM [facility] None  ?04/08/2022  3:30 PM Derek Jack, MD [facility] None  ?04/09/2022 11:20 AM Eppie Gibson, MD [facility] None  ? ? ?Curlene Labrum, MD ?[facility] ?[place], [place] [id number] ?[phone] (office) ? ?

## 2022-03-06 ENCOUNTER — Telehealth: Payer: Self-pay | Admitting: *Deleted

## 2022-03-06 ENCOUNTER — Encounter: Payer: Self-pay | Admitting: Gynecologic Oncology

## 2022-03-06 MED ORDER — HYDROCORTISONE 2.5 % EX CREA: TOPICAL_CREAM | Freq: Two times a day (BID) | CUTANEOUS | 0 refills | Status: DC

## 2022-03-06 NOTE — H&P (View-Only) (Signed)
Gynecologic Oncology Return Clinic Visit ? ?03/06/22 ? ?Reason for Visit: surgery planning, treatment discussion ? ?Treatment History: ?2/27: Pathology - EMB reveals Endometrioid carcinoma, FIGO grade 1. Prolapsing endocervical mass - benign endocervical polyp. ?3/10: Pelvic ultrasound shows abnormal thickened heterogeneous endometrial complex 18 mm thick, simple Right ovary cyst.  ? ?Interval History: ?Patient reports doing well after her biopsy.  She has had some occasional spotting as well as occasional bilateral pelvic pain.  This has not been daily and she describes it as similar to menstrual cramping. ? ?Patient finished her radiation at the beginning of the week.  Secondary to dermatitis of the left breast and axillary area, she has been started on antibiotics. ? ?Past Medical/Surgical History: ?Past Medical History:  ?Diagnosis Date  ? Asthma   ? uses albuterol approx 1-2x a week  ? Bell's palsy   ? resolved  ? Breast cancer ([place])   ? Fibromyalgia   ? Hypertension   ? PONV (postoperative nausea and vomiting)   ? ? ?Past Surgical History:  ?Procedure Laterality Date  ? BREAST BIOPSY WITH RADIO FREQUENCY LOCALIZER Left 12/27/2021  ? Procedure: EXCISIONAL BREAST BIOPSY WITH RADIO FREQUENCY LOCALIZER X2;  Surgeon: Virl Cagey, MD;  Location: [facility];  Service: General;  Laterality: Left;  ? NASAL SEPTUM SURGERY    ? TUBAL LIGATION    ? ? ?Family History  ?Adopted: Yes  ?Problem Relation Age of Onset  ? Uterine cancer Mother   ? ? ?Social History  ? ?Socioeconomic History  ? Marital status: Married  ?  Spouse name: Not on file  ? Number of children: Not on file  ? Years of education: Not on file  ? Highest education level: Not on file  ?Occupational History  ? Not on file  ?Tobacco Use  ? Smoking status: Never  ? Smokeless tobacco: Never  ?Vaping Use  ? Vaping Use: Never used  ?Substance and Sexual Activity  ? Alcohol use: No  ? Drug use: Never  ? Sexual activity: Yes  ?  Birth control/protection:  Post-menopausal  ?Other Topics Concern  ? Not on file  ?Social History Narrative  ? Not on file  ? ?Social Determinants of Health  ? ?Financial Resource Strain: Not on file  ?Food Insecurity: Not on file  ?Transportation Needs: Not on file  ?Physical Activity: Not on file  ?Stress: Not on file  ?Social Connections: Not on file  ? ? ?Current Medications: ? ?Current Outpatient Medications:  ?  albuterol (VENTOLIN HFA) 108 (90 Base) MCG/ACT inhaler, Inhale 2 puffs into the lungs every 6 (six) hours as needed for wheezing., Disp: 1 each, Rfl: 2 ?  anastrozole (ARIMIDEX) 1 MG tablet, Take 1 tablet (1 mg total) by mouth daily., Disp: 90 tablet, Rfl: 1 ?  doxycycline (VIBRAMYCIN) 50 MG capsule, Take 1 capsule (50 mg total) by mouth 2 (two) times daily for 7 days., Disp: 14 capsule, Rfl: 0 ?  hydrocortisone 2.5 % cream, Apply topically 2 (two) times daily., Disp: 30 g, Rfl: 0 ?  ibuprofen (ADVIL) 200 MG tablet, Take 200 mg by mouth every 8 (eight) hours as needed for moderate pain., Disp: , Rfl:  ?  lisinopril-hydrochlorothiazide (ZESTORETIC) 20-12.5 MG tablet, Take 1 tablet by mouth once daily, Disp: 30 tablet, Rfl: 1 ?  ondansetron (ZOFRAN) 4 MG tablet, Take 1 tablet (4 mg total) by mouth every 8 (eight) hours as needed., Disp: 30 tablet, Rfl: 1 ?  oxycodone (OXY-IR) 5 MG capsule, Take 1 capsule (5  mg total) by mouth every 4 (four) hours as needed. (Patient not taking: Reported on 03/06/2022), Disp: 30 capsule, Rfl: 0 ?  senna-docusate (SENOKOT-S) 8.6-50 MG tablet, Take 2 tablets by mouth at bedtime. For AFTER surgery, do not take if having diarrhea, Disp: 30 tablet, Rfl: 0 ?  Wound Dressings (SKINTEGRITY HYDROGEL) GEL, Apply 1 application. topically 2 (two) times daily. (Patient not taking: Reported on 03/06/2022), Disp: 120 mL, Rfl: 3 ? ?Review of Systems: ?+ Fatigue, hot flashes, vaginal spotting, pain/rash L breast, anxiety. ?Denies appetite changes, fevers, chills, unexplained weight changes. ?Denies hearing loss, neck  lumps or masses, mouth sores, ringing in ears or voice changes. ?Denies cough or wheezing.  Denies shortness of breath. ?Denies chest pain or palpitations. Denies leg swelling. ?Denies abdominal distention, pain, blood in stools, constipation, diarrhea, nausea, vomiting, or early satiety. ?Denies pain with intercourse, dysuria, frequency, hematuria or incontinence. ?Denies pelvic pain or vaginal discharge.   ?Denies joint pain, back pain or muscle pain/cramps. ?Denies itching or wounds. ?Denies dizziness, headaches, numbness or seizures. ?Denies swollen lymph nodes or glands, denies easy bruising or bleeding. ?Denies depression, confusion, or decreased concentration. ? ?Physical Exam: ?BP 122/71 (BP Location: Right Wrist, Patient Position: Sitting)   Pulse 78   Temp 98.3 ?F (36.8 ?C) (Oral)   Resp 14   Ht 5' 0.63" (1.54 m)   Wt 228 lb (103.4 kg)   LMP 10/06/2011   SpO2 100%   BMI 43.61 kg/m?  ?General: Alert, oriented, no acute distress. ?HEENT: Normocephalic, atraumatic, sclera anicteric. ?Chest: Clear to auscultation bilaterally.  No wheezes or rhonchi.  Erythema of the left breast and axillary region, mild blanching. ?Cardiovascular: Regular rate and rhythm, no murmurs. ?Abdomen: Obese, soft, nontender.  Normoactive bowel sounds.  No masses or hepatosplenomegaly appreciated.   ?Extremities: Grossly normal range of motion.  Warm, well perfused.  No edema bilaterally. ?Skin: No rashes or lesions noted. ?Lymphatics: No cervical, supraclavicular, or inguinal adenopathy. ?GU: Deferred. ? ?Laboratory & Radiologic Studies: ?Endometrial biopsy on 2/27 shows endometrioid carcinoma, FIGO grade 1 as well as benign endocervical polyp. ? ?Pelvic ultrasound exam on 02/28/2022: Uterus measures 7.7 x 4 x 4 cm.  Endometrium is 18 mm, heterogenous, without discrete mass or fluid.  Right ovary has a 2.5 cm simple cyst. ? ?Assessment & Plan: ?Tammy Calderon is a 56 y.o. woman with clinical stage I endometrioid adenocarcinoma  who presents for treatment discussion. ? ?We reviewed the nature of endometrial cancer and its recommended surgical staging, including total hysterectomy, bilateral salpingo-oophorectomy, and lymph node assessment. The patient is a suitable candidate for staging via a minimally invasive approach to surgery.  We reviewed that robotic assistance would be used to complete the surgery.  ? ?We discussed that most endometrial cancer is detected early and that decisions regarding adjuvant therapy will be made based on her final pathology.  ? ?We reviewed the sentinel lymph node technique. Risks and benefits of sentinel lymph node biopsy was reviewed. We reviewed the technique and ICG dye. The patient DOES NOT have an iodine allergy or known liver dysfunction. We reviewed the false negative rate (0.4%), and that 3% of patients with metastatic disease will not have it detected by SLN biopsy in endometrial cancer. A low risk of allergic reaction to the dye, <0.2% for ICG, has been reported. We also discussed that in the case of failed mapping, which occurs 40% of the time, a bilateral or unilateral lymphadenectomy will be performed at the surgeon?s discretion.  ? ?Potential  benefits of sentinel nodes including a higher detection rate for metastasis due to ultrastaging and potential reduction in operative morbidity. However, there remains uncertainty as to the role for treatment of micrometastatic disease. Further, the benefit of operative morbidity associated with the SLN technique in endometrial cancer is not yet completely known. In other patient populations (e.g. the cervical cancer population) there has been observed reductions in morbidity with SLN biopsy compared to pelvic lymphadenectomy. Lymphedema, nerve dysfunction and lymphocysts are all potential risks with the SLN technique as with complete lymphadenectomy. Additional risks to the patient include the risk of damage to an internal organ while operating in an  altered view (e.g. the black and white image of the robotic fluorescence imaging mode).  ? ?We discussed plan for a robotic assisted hysterectomy, bilateral salpingo-oophorectomy, sentinel lymph node evaluation, possible lymph

## 2022-03-06 NOTE — Telephone Encounter (Signed)
Received fax from [employer].  ? ?Reports that Westcort 0.2% cream is not covered by insurance. Requested alternative of Hydrocortisone 2.5%.  ? ?Please advise.  ?

## 2022-03-06 NOTE — Telephone Encounter (Signed)
Prescription sent to pharmacy.

## 2022-03-06 NOTE — Progress Notes (Signed)
Gynecologic Oncology Return Clinic Visit ? ?03/06/22 ? ?Reason for Visit: surgery planning, treatment discussion ? ?Treatment History: ?2/27: Pathology - EMB reveals Endometrioid carcinoma, FIGO grade 1. Prolapsing endocervical mass - benign endocervical polyp. ?3/10: Pelvic ultrasound shows abnormal thickened heterogeneous endometrial complex 18 mm thick, simple Right ovary cyst.  ? ?Interval History: ?Patient reports doing well after her biopsy.  She has had some occasional spotting as well as occasional bilateral pelvic pain.  This has not been daily and she describes it as similar to menstrual cramping. ? ?Patient finished her radiation at the beginning of the week.  Secondary to dermatitis of the left breast and axillary area, she has been started on antibiotics. ? ?Past Medical/Surgical History: ?Past Medical History:  ?Diagnosis Date  ? Asthma   ? uses albuterol approx 1-2x a week  ? Bell's palsy   ? resolved  ? Breast cancer ([place])   ? Fibromyalgia   ? Hypertension   ? PONV (postoperative nausea and vomiting)   ? ? ?Past Surgical History:  ?Procedure Laterality Date  ? BREAST BIOPSY WITH RADIO FREQUENCY LOCALIZER Left 12/27/2021  ? Procedure: EXCISIONAL BREAST BIOPSY WITH RADIO FREQUENCY LOCALIZER X2;  Surgeon: Virl Cagey, MD;  Location: [facility];  Service: General;  Laterality: Left;  ? NASAL SEPTUM SURGERY    ? TUBAL LIGATION    ? ? ?Family History  ?Adopted: Yes  ?Problem Relation Age of Onset  ? Uterine cancer Mother   ? ? ?Social History  ? ?Socioeconomic History  ? Marital status: Married  ?  Spouse name: Not on file  ? Number of children: Not on file  ? Years of education: Not on file  ? Highest education level: Not on file  ?Occupational History  ? Not on file  ?Tobacco Use  ? Smoking status: Never  ? Smokeless tobacco: Never  ?Vaping Use  ? Vaping Use: Never used  ?Substance and Sexual Activity  ? Alcohol use: No  ? Drug use: Never  ? Sexual activity: Yes  ?  Birth control/protection:  Post-menopausal  ?Other Topics Concern  ? Not on file  ?Social History Narrative  ? Not on file  ? ?Social Determinants of Health  ? ?Financial Resource Strain: Not on file  ?Food Insecurity: Not on file  ?Transportation Needs: Not on file  ?Physical Activity: Not on file  ?Stress: Not on file  ?Social Connections: Not on file  ? ? ?Current Medications: ? ?Current Outpatient Medications:  ?  albuterol (VENTOLIN HFA) 108 (90 Base) MCG/ACT inhaler, Inhale 2 puffs into the lungs every 6 (six) hours as needed for wheezing., Disp: 1 each, Rfl: 2 ?  anastrozole (ARIMIDEX) 1 MG tablet, Take 1 tablet (1 mg total) by mouth daily., Disp: 90 tablet, Rfl: 1 ?  doxycycline (VIBRAMYCIN) 50 MG capsule, Take 1 capsule (50 mg total) by mouth 2 (two) times daily for 7 days., Disp: 14 capsule, Rfl: 0 ?  hydrocortisone 2.5 % cream, Apply topically 2 (two) times daily., Disp: 30 g, Rfl: 0 ?  ibuprofen (ADVIL) 200 MG tablet, Take 200 mg by mouth every 8 (eight) hours as needed for moderate pain., Disp: , Rfl:  ?  lisinopril-hydrochlorothiazide (ZESTORETIC) 20-12.5 MG tablet, Take 1 tablet by mouth once daily, Disp: 30 tablet, Rfl: 1 ?  ondansetron (ZOFRAN) 4 MG tablet, Take 1 tablet (4 mg total) by mouth every 8 (eight) hours as needed., Disp: 30 tablet, Rfl: 1 ?  oxycodone (OXY-IR) 5 MG capsule, Take 1 capsule (5  mg total) by mouth every 4 (four) hours as needed. (Patient not taking: Reported on 03/06/2022), Disp: 30 capsule, Rfl: 0 ?  senna-docusate (SENOKOT-S) 8.6-50 MG tablet, Take 2 tablets by mouth at bedtime. For AFTER surgery, do not take if having diarrhea, Disp: 30 tablet, Rfl: 0 ?  Wound Dressings (SKINTEGRITY HYDROGEL) GEL, Apply 1 application. topically 2 (two) times daily. (Patient not taking: Reported on 03/06/2022), Disp: 120 mL, Rfl: 3 ? ?Review of Systems: ?+ Fatigue, hot flashes, vaginal spotting, pain/rash L breast, anxiety. ?Denies appetite changes, fevers, chills, unexplained weight changes. ?Denies hearing loss, neck  lumps or masses, mouth sores, ringing in ears or voice changes. ?Denies cough or wheezing.  Denies shortness of breath. ?Denies chest pain or palpitations. Denies leg swelling. ?Denies abdominal distention, pain, blood in stools, constipation, diarrhea, nausea, vomiting, or early satiety. ?Denies pain with intercourse, dysuria, frequency, hematuria or incontinence. ?Denies pelvic pain or vaginal discharge.   ?Denies joint pain, back pain or muscle pain/cramps. ?Denies itching or wounds. ?Denies dizziness, headaches, numbness or seizures. ?Denies swollen lymph nodes or glands, denies easy bruising or bleeding. ?Denies depression, confusion, or decreased concentration. ? ?Physical Exam: ?BP 122/71 (BP Location: Right Wrist, Patient Position: Sitting)   Pulse 78   Temp 98.3 ?F (36.8 ?C) (Oral)   Resp 14   Ht 5' 0.63" (1.54 m)   Wt 228 lb (103.4 kg)   LMP 10/06/2011   SpO2 100%   BMI 43.61 kg/m?  ?General: Alert, oriented, no acute distress. ?HEENT: Normocephalic, atraumatic, sclera anicteric. ?Chest: Clear to auscultation bilaterally.  No wheezes or rhonchi.  Erythema of the left breast and axillary region, mild blanching. ?Cardiovascular: Regular rate and rhythm, no murmurs. ?Abdomen: Obese, soft, nontender.  Normoactive bowel sounds.  No masses or hepatosplenomegaly appreciated.   ?Extremities: Grossly normal range of motion.  Warm, well perfused.  No edema bilaterally. ?Skin: No rashes or lesions noted. ?Lymphatics: No cervical, supraclavicular, or inguinal adenopathy. ?GU: Deferred. ? ?Laboratory & Radiologic Studies: ?Endometrial biopsy on 2/27 shows endometrioid carcinoma, FIGO grade 1 as well as benign endocervical polyp. ? ?Pelvic ultrasound exam on 02/28/2022: Uterus measures 7.7 x 4 x 4 cm.  Endometrium is 18 mm, heterogenous, without discrete mass or fluid.  Right ovary has a 2.5 cm simple cyst. ? ?Assessment & Plan: ?Tammy Calderon is a 56 y.o. woman with clinical stage I endometrioid adenocarcinoma  who presents for treatment discussion. ? ?We reviewed the nature of endometrial cancer and its recommended surgical staging, including total hysterectomy, bilateral salpingo-oophorectomy, and lymph node assessment. The patient is a suitable candidate for staging via a minimally invasive approach to surgery.  We reviewed that robotic assistance would be used to complete the surgery.  ? ?We discussed that most endometrial cancer is detected early and that decisions regarding adjuvant therapy will be made based on her final pathology.  ? ?We reviewed the sentinel lymph node technique. Risks and benefits of sentinel lymph node biopsy was reviewed. We reviewed the technique and ICG dye. The patient DOES NOT have an iodine allergy or known liver dysfunction. We reviewed the false negative rate (0.4%), and that 3% of patients with metastatic disease will not have it detected by SLN biopsy in endometrial cancer. A low risk of allergic reaction to the dye, <0.2% for ICG, has been reported. We also discussed that in the case of failed mapping, which occurs 40% of the time, a bilateral or unilateral lymphadenectomy will be performed at the surgeon?s discretion.  ? ?Potential  benefits of sentinel nodes including a higher detection rate for metastasis due to ultrastaging and potential reduction in operative morbidity. However, there remains uncertainty as to the role for treatment of micrometastatic disease. Further, the benefit of operative morbidity associated with the SLN technique in endometrial cancer is not yet completely known. In other patient populations (e.g. the cervical cancer population) there has been observed reductions in morbidity with SLN biopsy compared to pelvic lymphadenectomy. Lymphedema, nerve dysfunction and lymphocysts are all potential risks with the SLN technique as with complete lymphadenectomy. Additional risks to the patient include the risk of damage to an internal organ while operating in an  altered view (e.g. the black and white image of the robotic fluorescence imaging mode).  ? ?We discussed plan for a robotic assisted hysterectomy, bilateral salpingo-oophorectomy, sentinel lymph node evaluation, possible lymph

## 2022-03-07 ENCOUNTER — Other Ambulatory Visit: Payer: Self-pay

## 2022-03-07 ENCOUNTER — Telehealth: Payer: Self-pay

## 2022-03-07 ENCOUNTER — Inpatient Hospital Stay: Payer: 59 | Attending: Gynecologic Oncology | Admitting: Gynecologic Oncology

## 2022-03-07 ENCOUNTER — Ambulatory Visit (HOSPITAL_COMMUNITY): Payer: 59 | Admitting: Physical Therapy

## 2022-03-07 ENCOUNTER — Encounter: Payer: Self-pay | Admitting: Gynecologic Oncology

## 2022-03-07 ENCOUNTER — Inpatient Hospital Stay (HOSPITAL_BASED_OUTPATIENT_CLINIC_OR_DEPARTMENT_OTHER): Payer: 59 | Admitting: Gynecologic Oncology

## 2022-03-07 VITALS — BP 122/71 | HR 78 | Temp 98.3°F | Resp 14 | Ht 60.63 in | Wt 228.0 lb

## 2022-03-07 DIAGNOSIS — R5383 Other fatigue: Secondary | ICD-10-CM | POA: Insufficient documentation

## 2022-03-07 DIAGNOSIS — Z7189 Other specified counseling: Secondary | ICD-10-CM | POA: Diagnosis not present

## 2022-03-07 DIAGNOSIS — D0512 Intraductal carcinoma in situ of left breast: Secondary | ICD-10-CM | POA: Diagnosis not present

## 2022-03-07 DIAGNOSIS — Z923 Personal history of irradiation: Secondary | ICD-10-CM | POA: Insufficient documentation

## 2022-03-07 DIAGNOSIS — C541 Malignant neoplasm of endometrium: Secondary | ICD-10-CM | POA: Insufficient documentation

## 2022-03-07 DIAGNOSIS — F419 Anxiety disorder, unspecified: Secondary | ICD-10-CM | POA: Diagnosis not present

## 2022-03-07 DIAGNOSIS — N950 Postmenopausal bleeding: Secondary | ICD-10-CM | POA: Insufficient documentation

## 2022-03-07 DIAGNOSIS — N951 Menopausal and female climacteric states: Secondary | ICD-10-CM | POA: Insufficient documentation

## 2022-03-07 DIAGNOSIS — L589 Radiodermatitis, unspecified: Secondary | ICD-10-CM | POA: Diagnosis not present

## 2022-03-07 MED ORDER — SENNOSIDES-DOCUSATE SODIUM 8.6-50 MG PO TABS: 2.0000 | ORAL_TABLET | Freq: Every day | ORAL | 0 refills | Status: DC

## 2022-03-07 NOTE — Telephone Encounter (Signed)
Called and spoke with patient to see how she was feeling since completing radiation this past Monday. She stated she was doing fine, but was uncomfortable due to the itching/irritation to her chest and sensitivity under her breast fold. Encouraged her to continue to apply radiaplex to intact skin as well as Neosporin + pain relief to the area of peeling under her breast fold. Also advised to try and let cool air blow under the breast as often as possible to let the skin breathe in between ointment applications. Informed her she should start seeing improvement when she is 2 weeks out from completing radiation. However, should she not have any discernable improvement or feel symptoms were worsening, encouraged her to call me on Wed 3/22 so we could work her into Dr. Pearlie Calderon schedule Tammy Calderon 3/24. Patient verbalized understanding and appreciation of call, and confirmed that she had my direct office number.  ?

## 2022-03-07 NOTE — Progress Notes (Signed)
Patient here with family for follow up with Dr. Jeral Pinch and for a pre-operative discussion prior to her scheduled surgery on March 19, 2022. She is scheduled for robotic assisted total laparoscopic hysterectomy, bilateral salpingo-oophorectomy, sentinel lymph node biopsy, possible lymph node dissection, possible laparotomy. The surgery was discussed in detail.  See after visit summary for additional details. Visual aids used to discuss items related to surgery including sequential compression stockings, foley catheter, IV pump, multi-modal pain regimen including tylenol, photo of the surgical robot, female reproductive system to discuss surgery in detail.    ?  ?Discussed post-op pain management in detail including the aspects of the enhanced recovery pathway.  Advised her that a new prescription would be sent in for oxycodone and it is only to be used for after her upcoming surgery.  We discussed the use of tylenol post-op and to monitor for a maximum of 4,000 mg in a 24 hour period.  Also prescribed sennakot to be used after surgery and to hold if having loose stools.  Discussed bowel regimen in detail.   ?  ?Discussed the use of heparin pre-op, SCDs, and measures to take at home to prevent DVT including frequent mobility.  Reportable signs and symptoms of DVT discussed. Post-operative instructions discussed and expectations for after surgery. Incisional care discussed as well including reportable signs and symptoms including erythema, drainage, wound separation.  ?   ?10 minutes spent with the patient.  Verbalizing understanding of material discussed. No needs or concerns voiced at the end of the visit.   Advised patient and family to call for any needs.  Advised that her post-operative medications had been prescribed and could be picked up at any time.   ? ?This appointment is included in the global surgical bundle as pre-operative teaching and has no charge.     ?

## 2022-03-07 NOTE — Patient Instructions (Signed)
Preparing for your Surgery ? ?Plan for surgery on March 19, 2022 with Dr. Jeral Pinch at [facility] will be scheduled for robotic assisted total laparoscopic hysterectomy (removal of the uterus and cervix), bilateral salpingo-oophorectomy (removal of both ovaries and fallopian tubes), sentinel lymph node biopsy, possible lymph node dissection, possible laparotomy (larger incision on your abdomen if needed).  ? ?Plan to stop taking your arimidex starting March 14, 2022.  ? ?Pre-operative Testing ?-You will receive a phone call from presurgical testing at [facility] to arrange for a pre-operative appointment and lab work. ? ?-Bring your insurance card, copy of an advanced directive if applicable, medication list ? ?-At that visit, you will be asked to sign a consent for a possible blood transfusion in case a transfusion becomes necessary during surgery.  The need for a blood transfusion is rare but having consent is a necessary part of your care.    ? ?-You should not be taking blood thinners or aspirin at least ten days prior to surgery unless instructed by your surgeon. ? ?-Do not take supplements such as fish oil (omega 3), red yeast rice, turmeric before your surgery. You want to avoid medications with aspirin in them including headache powders such as BC or Goody's), Excedrin migraine. ? ?Day Before Surgery at Home ?-You will be asked to take in a light diet the day before surgery. You will be advised you can have clear liquids up until 3 hours before your surgery.   ? ?Eat a light diet the day before surgery.  Examples including soups, broths, toast, yogurt, mashed potatoes.  AVOID GAS PRODUCING FOODS. Things to avoid include carbonated beverages (fizzy beverages, sodas), raw fruits and raw vegetables (uncooked), or beans.  ? ?If your bowels are filled with gas, your surgeon will have difficulty visualizing your pelvic organs which increases your surgical risks. ? ?Your role in  recovery ?Your role is to become active as soon as directed by your doctor, while still giving yourself time to heal.  Rest when you feel tired. You will be asked to do the following in order to speed your recovery: ? ?- Cough and breathe deeply. This helps to clear and expand your lungs and can prevent pneumonia after surgery.  ?- STAY ACTIVE WHEN YOU GET HOME. Do mild physical activity. Walking or moving your legs help your circulation and body functions return to normal. Do not try to get up or walk alone the first time after surgery.   ?-If you develop swelling on one leg or the other, pain in the back of your leg, redness/warmth in one of your legs, please call the office or go to the Emergency Room to have a doppler to rule out a blood clot. For shortness of breath, chest pain-seek care in the Emergency Room as soon as possible. ?- Actively manage your pain. Managing your pain lets you move in comfort. We will ask you to rate your pain on a scale of zero to 10. It is your responsibility to tell your doctor or nurse where and how much you hurt so your pain can be treated. ? ?Special Considerations ?-If you are diabetic, you may be placed on insulin after surgery to have closer control over your blood sugars to promote healing and recovery.  This does not mean that you will be discharged on insulin.  If applicable, your oral antidiabetics will be resumed when you are tolerating a solid diet. ? ?-Your final pathology results from surgery  should be available around one week after surgery and the results will be relayed to you when available. ? ?-FMLA forms can be faxed to [phone] and please allow 5-7 business days for completion. ? ?Pain Management After Surgery ?-You can use the oxycodone you have at home for pain if needed. ? ?-Make sure that you have Tylenol and Ibuprofen (if you are able to take these medications) at home to use on a regular basis after surgery for pain control. We recommend alternating  the medications every hour to six hours since they work differently and are processed in the body differently for pain relief. ? ?-Review the attached handout on narcotic use and their risks and side effects.  ? ?Bowel Regimen ?-You have been prescribed Sennakot-S to take nightly to prevent constipation especially if you are taking the narcotic pain medication intermittently.  It is important to prevent constipation and drink adequate amounts of liquids. You can stop taking this medication when you are not taking pain medication and you are back on your normal bowel routine. ? ?Risks of Surgery ?Risks of surgery are low but include bleeding, infection, damage to surrounding structures, re-operation, blood clots, and very rarely death. ? ? ?Blood Transfusion Information (For the consent to be signed before surgery) ? ?We will be checking your blood type before surgery so in case of emergencies, we will know what type of blood you would need. ? ?                                          WHAT IS A BLOOD TRANSFUSION? ? ?A transfusion is the replacement of blood or some of its parts. Blood is made up of multiple cells which provide different functions. ?Red blood cells carry oxygen and are used for blood loss replacement. ?White blood cells fight against infection. ?Platelets control bleeding. ?Plasma helps clot blood. ?Other blood products are available for specialized needs, such as hemophilia or other clotting disorders. ?BEFORE THE TRANSFUSION  ?Who gives blood for transfusions?  ?You may be able to donate blood to be used at a later date on yourself (autologous donation). ?Relatives can be asked to donate blood. This is generally not any safer than if you have received blood from a stranger. The same precautions are taken to ensure safety when a relative's blood is donated. ?Healthy volunteers who are fully evaluated to make sure their blood is safe. This is blood bank blood. ?Transfusion therapy is the safest it  has ever been in the practice of medicine. Before blood is taken from a donor, a complete history is taken to make sure that person has no history of diseases nor engages in risky social behavior (examples are intravenous drug use or sexual activity with multiple partners). The donor's travel history is screened to minimize risk of transmitting infections, such as malaria. The donated blood is tested for signs of infectious diseases, such as HIV and hepatitis. The blood is then tested to be sure it is compatible with you in order to minimize the chance of a transfusion reaction. If you or a relative donates blood, this is often done in anticipation of surgery and is not appropriate for emergency situations. It takes many days to process the donated blood. ?RISKS AND COMPLICATIONS ?Although transfusion therapy is very safe and saves many lives, the main dangers of transfusion include:  ?Getting an infectious disease. ?Developing  a transfusion reaction. This is an allergic reaction to something in the blood you were given. Every precaution is taken to prevent this. ?The decision to have a blood transfusion has been considered carefully by your caregiver before blood is given. Blood is not given unless the benefits outweigh the risks. ? ?AFTER SURGERY INSTRUCTIONS ? ?Return to work: 4-6 weeks if applicable ? ?Activity: ?1. Be up and out of the bed during the day.  Take a nap if needed.  You may walk up steps but be careful and use the hand rail.  Stair climbing will tire you more than you think, you may need to stop part way and rest.  ? ?2. No lifting or straining for 6 weeks over 10 pounds. No pushing, pulling, straining for 6 weeks. ? ?3. No driving for around 1 week(s).  Do not drive if you are taking narcotic pain medicine and make sure that your reaction time has returned.  ? ?4. You can shower as soon as the next day after surgery. Shower daily.  Use your regular soap and water (not directly on the incision) and  pat your incision(s) dry afterwards; don't rub.  No tub baths or submerging your body in water until cleared by your surgeon. If you have the soap that was given to you by pre-surgical testing that was used

## 2022-03-07 NOTE — Progress Notes (Signed)
COVID swab appointment: ? ?COVID Vaccine Completed:  Yes x2 ?Date COVID Vaccine completed: 07-27-20 08-17-20 ?Has received booster: ?COVID vaccine manufacturer: [place]  ? ?Date of COVID positive in last 90 days: ? ?PCP -  ?Cardiologist -  ? ?Chest x-ray - 03-25-21 Epic ?EKG - 12-25-21 Epic ?Stress Test -  ?ECHO -  ?Cardiac Cath -  ?Pacemaker/ICD device last checked: ?Spinal Cord Stimulator: ? ?Bowel Prep -  ? ?Sleep Study -  ?CPAP -  ? ?Fasting Blood Sugar -  ?Checks Blood Sugar _____ times a day ? ?Blood Thinner Instructions: ?Aspirin Instructions: ?Last Dose: ? ?Activity level:  Can go up a flight of stairs and perform activities of daily living without stopping and without symptoms of chest pain or shortness of breath. ?  Able to exercise without symptoms ? ?Unable to go up a flight of stairs without symptoms of  ?   ? ?Anesthesia review:  ? ?Patient denies shortness of breath, fever, cough and chest pain at PAT appointment ? ? ?Patient verbalized understanding of instructions that were given to them at the PAT appointment. Patient was also instructed that they will need to review over the PAT instructions again at home before surgery.  ?

## 2022-03-10 ENCOUNTER — Encounter (HOSPITAL_COMMUNITY): Payer: 59 | Admitting: Physical Therapy

## 2022-03-10 NOTE — Patient Instructions (Signed)
DUE TO COVID-19 ONLY ONE VISITOR  (aged 54 and older)  IS ALLOWED TO COME WITH YOU AND STAY IN THE WAITING ROOM ONLY DURING PRE OP AND PROCEDURE.   ?**NO VISITORS ARE ALLOWED IN THE SHORT STAY AREA OR RECOVERY ROOM!!** ? ? ?You are not required to quarantine, however you are required to wear a well-fitted mask when you are out and around people not in your household.  ?Hand Hygiene often ?Do NOT share personal items ?Notify your provider if you are in close contact with someone who has COVID or you develop fever 100.4 or greater, new onset of sneezing, cough, sore throat, shortness of breath or body aches. ? ?     ? Your procedure is scheduled on: Wednesday, 03-19-22 ? ? Report to [facility] Main Entrance ? ?  Report to admitting at 5:15 AM ? ? Call this number if you have problems the morning of surgery [phone] ? ? Follow a light diet day before, avoid gas producing foods ? ? Do not eat food :After Midnight. ? ? After Midnight you may have the following liquids until 4:30 AM DAY OF SURGERY ? ?Water ?Black Coffee (sugar ok, NO MILK/CREAM OR CREAMERS)  ?Tea (sugar ok, NO MILK/CREAM OR CREAMERS) regular and decaf                             ?Plain Jell-O (NO RED)                                           ?Fruit ices (not with fruit pulp, NO RED)                                     ?Popsicles (NO RED)                                                                  ?Juice: apple, WHITE grape, WHITE cranberry ?Sports drinks like Gatorade (NO RED) ?Clear broth(vegetable,chicken,beef) ? ?             ?Drink 1 Ensure AT 4:30 AM the morning of surgery. ? ?  ?  ?The day of surgery:  ?Drink ONE (1) Pre-Surgery Clear Ensure at 4:30 AM the morning of surgery. Drink in one sitting. Do not sip.  ?This drink was given to you during your hospital  ?pre-op appointment visit. ?Nothing else to drink after completing the Pre-Surgery Clear Ensure  ?       If you have questions, please contact your surgeon?s  office. ? ? ?FOLLOW BOWEL PREP AND ANY ADDITIONAL PRE OP INSTRUCTIONS YOU RECEIVED FROM YOUR SURGEON'S OFFICE!!! ?  ?  ?Oral Hygiene is also important to reduce your risk of infection.                                    ?Remember - BRUSH YOUR TEETH THE MORNING OF SURGERY WITH YOUR REGULAR TOOTHPASTE ? ? Do NOT smoke after  Midnight ? ? Take these medicines the morning of surgery with A SIP OF WATER:  Ondansetron if needed ? ?DO NOT TAKE ANY ORAL DIABETIC MEDICATIONS DAY OF YOUR SURGERY ?                  ?           You may not have any metal on your body including hair pins, jewelry, and body piercing ? ?           Do not wear make-up, lotions, powders, perfumes or deodorant ? ?Do not wear nail polish including gel and S&S, artificial/acrylic nails, or any other type of covering on natural nails including finger and toenails. If you have artificial nails, gel coating, etc. that needs to be removed by a nail salon please have this removed prior to surgery or surgery may need to be canceled/ delayed if the surgeon/ anesthesia feels like they are unable to be safely monitored.  ? ?Do not shave  48 hours prior to surgery.  ? ?    Do not bring valuables to the hospital. [facility]. ? ? Contacts, dentures or bridgework may not be worn into surgery. ? ?Patients discharged on the day of surgery will not be allowed to drive home.  Someone NEEDS to stay with you for the first 24 hours after anesthesia. ? ?Special Instructions: Bring a copy of your healthcare power of attorney and living will documents  the day of surgery if you haven't scanned them before. ? ?Please read over the following fact sheets you were given: IF [place] [phone] ? ?[facility] - Preparing for Surgery ?Before surgery, you can play an important role.  Because skin is not sterile, your skin needs to be as free of germs as possible.  You can reduce the number of  germs on your skin by washing with CHG (chlorahexidine gluconate) soap before surgery.  CHG is an antiseptic cleaner which kills germs and bonds with the skin to continue killing germs even after washing. ?Please DO NOT use if you have an allergy to CHG or antibacterial soaps.  If your skin becomes reddened/irritated stop using the CHG and inform your nurse when you arrive at Short Stay. ?Do not shave (including legs and underarms) for at least 48 hours prior to the first CHG shower.  You may shave your face/neck. ? ?Please follow these instructions carefully: ? 1.  Shower with CHG Soap the night before surgery and the  morning of surgery. ? 2.  If you choose to wash your hair, wash your hair first as usual with your normal  shampoo. ? 3.  After you shampoo, rinse your hair and body thoroughly to remove the shampoo.                            ? 4.  Use CHG as you would any other liquid soap.  You can apply chg directly to the skin and wash.  Gently with a scrungie or clean washcloth. ? 5.  Apply the CHG Soap to your body ONLY FROM THE NECK DOWN.   Do   not use on face/ open      ?                     Wound or open sores. Avoid contact with eyes, ears  mouth and   genitals (private parts).  ?                     Production manager,  Genitals (private parts) with your normal soap. ?            6.  Wash thoroughly, paying special attention to the area where your    surgery  will be performed. ? 7.  Thoroughly rinse your body with warm water from the neck down. ? 8.  DO NOT shower/wash with your normal soap after using and rinsing off the CHG Soap. ?               9.  Pat yourself dry with a clean towel. ?           10.  Wear clean pajamas. ?           11.  Place clean sheets on your bed the night of your first shower and do not  sleep with pets. ?Day of Surgery : ?Do not apply any lotions/deodorants the morning of surgery.  Please wear clean clothes to the hospital/surgery center. ? ?FAILURE TO FOLLOW THESE INSTRUCTIONS MAY  RESULT IN THE CANCELLATION OF YOUR SURGERY ? ?PATIENT SIGNATURE_________________________________ ? ?NURSE SIGNATURE__________________________________ ? ?________________________________________________________________________  ?  ?WHAT IS A BLOOD TRANSFUSION? Blood Transfusion Information ? ?A transfusion is the replacement of blood or some of its parts. Blood is made up of multiple cells which provide different functions. ?Red blood cells carry oxygen and are used for blood loss replacement. ?White blood cells fight against infection. ?Platelets control bleeding. ?Plasma helps clot blood. ?Other blood products are available for specialized needs, such as hemophilia or other clotting disorders. ?BEFORE THE TRANSFUSION  ?Who gives blood for transfusions?  ?Healthy volunteers who are fully evaluated to make sure their blood is safe. This is blood bank blood. ?Transfusion therapy is the safest it has ever been in the practice of medicine. Before blood is taken from a donor, a complete history is taken to make sure that person has no history of diseases nor engages in risky social behavior (examples are intravenous drug use or sexual activity with multiple partners). The donor's travel history is screened to minimize risk of transmitting infections, such as malaria. The donated blood is tested for signs of infectious diseases, such as HIV and hepatitis. The blood is then tested to be sure it is compatible with you in order to minimize the chance of a transfusion reaction. If you or a relative donates blood, this is often done in anticipation of surgery and is not appropriate for emergency situations. It takes many days to process the donated blood. ?RISKS AND COMPLICATIONS ?Although transfusion therapy is very safe and saves many lives, the main dangers of transfusion include:  ?Getting an infectious disease. ?Developing a transfusion reaction. This is an allergic reaction to something in the blood you were given. Every  precaution is taken to prevent this. ?The decision to have a blood transfusion has been considered carefully by your caregiver before blood is given. Blood is not given unless the benefits outweigh the risks. ?AFTER TH

## 2022-03-11 ENCOUNTER — Encounter (HOSPITAL_COMMUNITY)
Admission: RE | Admit: 2022-03-11 | Discharge: 2022-03-11 | Disposition: A | Discharge status: Home / Self Care | Payer: 59 | Source: Ambulatory Visit | Attending: Anesthesiology | Admitting: Anesthesiology

## 2022-03-11 ENCOUNTER — Other Ambulatory Visit: Payer: Self-pay | Admitting: Gynecologic Oncology

## 2022-03-11 DIAGNOSIS — C541 Malignant neoplasm of endometrium: Secondary | ICD-10-CM

## 2022-03-11 MED ORDER — OXYCODONE HCL 5 MG PO CAPS: 5.0000 mg | ORAL_CAPSULE | ORAL | 0 refills | Status: DC | PRN

## 2022-03-11 NOTE — Progress Notes (Addendum)
Anesthesia Review: ? ?PCP: ?Cardiologist : ?Chest x-ray : ?EKG :12/25/21  ?Echo : ?Stress test: ?Cardiac Cath :  ?Activity level:  ?Sleep Study/ CPAP : ?Fasting Blood Sugar :      / Checks Blood Sugar -- times a day:   ?Blood Thinner/ Instructions /Last Dose: ?ASA / Instructions/ Last Dose :   ?At 0801 am on 03/14/22 pt was not present for preop appt. Called admitting and front desk.  PT not arrived,  Called and [facility]MM ?At 0825am pt still had not arrived for preop appt called and [facility]MM.   ?[id number] - pt has never arrived to admitting.  Called and [facility]MM asking pt to call [phone] to reschedule preop appt.   ?

## 2022-03-12 ENCOUNTER — Encounter (HOSPITAL_COMMUNITY): Payer: 59 | Admitting: Physical Therapy

## 2022-03-14 ENCOUNTER — Encounter (HOSPITAL_COMMUNITY): Payer: 59 | Admitting: Physical Therapy

## 2022-03-14 ENCOUNTER — Telehealth: Payer: Self-pay

## 2022-03-14 ENCOUNTER — Encounter (HOSPITAL_COMMUNITY)
Admission: RE | Admit: 2022-03-14 | Discharge: 2022-03-14 | Disposition: A | Discharge status: Home / Self Care | Payer: 59 | Source: Ambulatory Visit | Attending: Family Medicine | Admitting: Family Medicine

## 2022-03-14 NOTE — Progress Notes (Signed)
Pt called back at 1110am on 03/14/22 stating she had been up all nite with radiation burn ( currently receiving breast radiation per pt and has torn rotator cuff issue) .  Called to reschedule preop appt.  Offered to completed med hx and instrucitons via phone and to come ih for labs or to reschedule preop appt.  PT givne number of [phone].  Ashely with OB/GYN Oncology aware pt finally called back and pt was going to reschedule preop appt.   ?

## 2022-03-14 NOTE — Telephone Encounter (Signed)
Called patient in regards to missing her Pre-op appt at [facility]. LM for patient to call [phone] with another appt date and time.  ?

## 2022-03-14 NOTE — Progress Notes (Addendum)
DUE TO COVID-19 ONLY ONE VISITOR IS ALLOWED TO COME WITH YOU AND STAY IN THE WAITING ROOM ONLY DURING PRE OP AND PROCEDURE DAY OF SURGERY.  2 VISITOR  MAY VISIT WITH YOU AFTER SURGERY IN YOUR PRIVATE ROOM DURING VISITING HOURS ONLY! ?YOU MAY HAVE ONE PERSON SPEND THE NITE WITH YOU IN YOUR ROOM AFTER SURGERY.   ? ?  ? ? Your procedure is scheduled on:  ?  03/19/22  ? Report to [facility] Main  Entrance ? ? Report to admitting at       0515          AM ?DO NOT [place], PICTURE ID OR WALLET DAY OF SURGERY.  ?  ? ? Call this number if you have problems the morning of surgery [phone]  ?Eat a lgiht diet the day before surgery  Avoid gas producing foods.   ? REMEMBER: NO  SOLID FOODS , CANDY, GUM OR MINTS AFTER MIDNITE THE NITE BEFORE SURGERY .       [place] CLEAR LIQUIDS UNTIL       0430am         DAY OF SURGERY.      PLEASE FINISH ENSURE DRINK PER SURGEON ORDER  WHICH NEEDS TO BE COMPLETED AT      0430am      MORNING OF SURGERY.   ? ? ? ? ?CLEAR LIQUID DIET ? ? ?Foods Allowed      ?WATER ?BLACK COFFEE ( SUGAR OK, NO MILK, CREAM OR CREAMER) REGULAR AND DECAF  ?TEA ( SUGAR OK NO MILK, CREAM, OR CREAMER) REGULAR AND DECAF  ?PLAIN JELLO ( NO RED)  ?FRUIT ICES ( NO RED, NO FRUIT PULP)  ?POPSICLES ( NO RED)  ?JUICE- APPLE, WHITE GRAPE AND WHITE CRANBERRY  ?SPORT DRINK LIKE GATORADE ( NO RED)  ?CLEAR BROTH ( VEGETABLE , CHICKEN OR BEEF)                                                               ? ?    ? ?BRUSH YOUR TEETH MORNING OF SURGERY AND RINSE YOUR MOUTH OUT, NO CHEWING GUM CANDY OR MINTS. ?  ? ? Take these medicines the morning of surgery with A SIP OF WATER:  Inhalers is needed and bring, arimidex  ? ? ?DO NOT TAKE ANY DIABETIC MEDICATIONS DAY OF YOUR SURGERY ?                  ?            You may not have any metal on your body including hair pins and  ?            piercings  Do not wear jewelry, make-up, lotions, powders or perfumes, deodorant ?            Do not wear nail polish on your  fingernails.   ?           IF YOU ARE A FEMALE AND WANT TO SHAVE UNDER ARMS OR LEGS PRIOR TO SURGERY YOU MUST DO SO AT LEAST 48 HOURS PRIOR TO SURGERY.  ?            Men may shave face and neck. ? ? Do not bring valuables to the  hospital. [facility] IS NOT ?            RESPONSIBLE   FOR VALUABLES. ? Contacts, dentures or bridgework may not be worn into surgery. ? Leave suitcase in the car. After surgery it may be brought to your room. ? ?  ? Patients discharged the day of surgery will not be allowed to drive home. IF YOU ARE HAVING SURGERY AND GOING HOME THE SAME DAY, YOU MUST HAVE AN ADULT TO DRIVE YOU HOME AND BE WITH YOU FOR 24 HOURS. YOU MAY GO HOME BY TAXI OR UBER OR ORTHERWISE, BUT AN ADULT MUST ACCOMPANY YOU HOME AND STAY WITH YOU FOR 24 HOURS. ?  ? ?            Please read over the following fact sheets you were given: ?_____________________________________________________________________ ? ? - Preparing for Surgery ?Before surgery, you can play an important role.  Because skin is not sterile, your skin needs to be as free of germs as possible.  You can reduce the number of germs on your skin by washing with CHG (chlorahexidine gluconate) soap before surgery.  CHG is an antiseptic cleaner which kills germs and bonds with the skin to continue killing germs even after washing. ?Please DO NOT use if you have an allergy to CHG or antibacterial soaps.  If your skin becomes reddened/irritated stop using the CHG and inform your nurse when you arrive at Short Stay. ?Do not shave (including legs and underarms) for at least 48 hours prior to the first CHG shower.  You may shave your face/neck. ?Please follow these instructions carefully: ? 1.  Shower with CHG Soap the night before surgery and the  morning of Surgery. ? 2.  If you choose to wash your hair, wash your hair first as usual with your  normal  shampoo. ? 3.  After you shampoo, rinse your hair and body thoroughly to remove the  shampoo.                            4.  Use CHG as you would any other liquid soap.  You can apply chg directly  to the skin and wash  ?                     Gently with a scrungie or clean washcloth. ? 5.  Apply the CHG Soap to your body ONLY FROM THE NECK DOWN.   Do not use on face/ open      ?                     Wound or open sores. Avoid contact with eyes, ears mouth and genitals (private parts).  ?                     Production manager,  Genitals (private parts) with your normal soap. ?            6.  Wash thoroughly, paying special attention to the area where your surgery  will be performed. ? 7.  Thoroughly rinse your body with warm water from the neck down. ? 8.  DO NOT shower/wash with your normal soap after using and rinsing off  the CHG Soap. ?               9.  Pat yourself dry with a clean towel. ?  10.  Wear clean pajamas. ?           11.  Place clean sheets on your bed the night of your first shower and do not  sleep with pets. ?Day of Surgery : ?Do not apply any lotions/deodorants the morning of surgery.  Please wear clean clothes to the hospital/surgery center. ? ?FAILURE TO FOLLOW THESE INSTRUCTIONS MAY RESULT IN THE CANCELLATION OF YOUR SURGERY ?PATIENT SIGNATURE_________________________________ ? ?NURSE SIGNATURE__________________________________ ? ?________________________________________________________________________  ? ? ?           ?

## 2022-03-17 ENCOUNTER — Encounter (HOSPITAL_COMMUNITY): Payer: 59 | Admitting: Physical Therapy

## 2022-03-17 NOTE — Patient Instructions (Addendum)
DUE TO COVID-19 ONLY ONE VISITOR  (aged 54 and older)  IS ALLOWED TO COME WITH YOU AND STAY IN THE WAITING ROOM ONLY DURING PRE OP AND PROCEDURE.   ?**NO VISITORS ARE ALLOWED IN THE SHORT STAY AREA OR RECOVERY ROOM!!**  ? ? Your procedure is scheduled on: 03/19/22 ? ? Report to [facility] Main Entrance ? ?  Report to admitting at 11:50 AM ? ? Call this number if you have problems the morning of surgery [phone] ? ? Do not eat food :After Midnight. ? ? After Midnight you may have the following liquids until 11:00 AM DAY OF SURGERY ? ?Water ?Black Coffee (sugar ok, NO MILK/CREAM OR CREAMERS)  ?Tea (sugar ok, NO MILK/CREAM OR CREAMERS) regular and decaf                             ?Plain Jell-O (NO RED)                                           ?Fruit ices (not with fruit pulp, NO RED)                                     ?Popsicles (NO RED)                                                                  ?Juice: apple, WHITE grape, WHITE cranberry ?Sports drinks like Gatorade (NO RED) ?Clear broth(vegetable,chicken,beef)  ?  ?The day of surgery:  ?Drink ONE (1) Pre-Surgery Clear Ensure at 11:00 AM the morning of surgery. Drink in one sitting. Do not sip.  ?This drink was given to you during your hospital  ?pre-op appointment visit. ?Nothing else to drink after completing the  ?Pre-Surgery Clear Ensure. ?  ?       If you have questions, please contact your surgeon?s office. ? ? ?FOLLOW BOWEL PREP AND ANY ADDITIONAL PRE OP INSTRUCTIONS YOU RECEIVED FROM YOUR SURGEON'S OFFICE!!! ?  ?  ?Oral Hygiene is also important to reduce your risk of infection.                                    ?Remember - BRUSH YOUR TEETH THE MORNING OF SURGERY WITH YOUR REGULAR TOOTHPASTE ? ? Take these medicines the morning of surgery with A SIP OF WATER: Zofran ?                  ?           You may not have any metal on your body including hair pins, jewelry, and body piercing ? ?           Do not wear make-up, lotions, powders,  perfumes, or deodorant ? ?Do not wear nail polish including gel and S&S, artificial/acrylic nails, or any other type of covering on natural nails including finger and toenails. If you have artificial nails, gel coating, etc. that needs to be  removed by a nail salon please have this removed prior to surgery or surgery may need to be canceled/ delayed if the surgeon/ anesthesia feels like they are unable to be safely monitored.  ? ?Do not shave  48 hours prior to surgery.  ? ? Do not bring valuables to the hospital. [facility] NOT ?            RESPONSIBLE   FOR VALUABLES. ? ? Contacts, dentures or bridgework may not be worn into surgery. ?  ? Patients discharged on the day of surgery will not be allowed to drive home.  Someone NEEDS to stay with you for the first 24 hours after anesthesia. ? ?            Please read over the following fact sheets you were given: [facility] [place] [phone]- Apolonio Schneiders ? ?   [facility] - Preparing for Surgery ?Before surgery, you can play an important role.  Because skin is not sterile, your skin needs to be as free of germs as possible.  You can reduce the number of germs on your skin by washing with CHG (chlorahexidine gluconate) soap before surgery.  CHG is an antiseptic cleaner which kills germs and bonds with the skin to continue killing germs even after washing. ?Please DO NOT use if you have an allergy to CHG or antibacterial soaps.  If your skin becomes reddened/irritated stop using the CHG and inform your nurse when you arrive at Short Stay. ?Do not shave (including legs and underarms) for at least 48 hours prior to the first CHG shower.  You may shave your face/neck. ? ?Please follow these instructions carefully: ? 1.  Shower with CHG Soap the night before surgery and the  morning of surgery. ? 2.  If you choose to wash your hair, wash your hair first as usual with your normal  shampoo. ? 3.  After you shampoo, rinse your  hair and body thoroughly to remove the shampoo.                            ? 4.  Use CHG as you would any other liquid soap.  You can apply chg directly to the skin and wash.  Gently with a scrungie or clean washcloth. ? 5.  Apply the CHG Soap to your body ONLY FROM THE NECK DOWN.   Do   not use on face/ open      ?                     Wound or open sores. Avoid contact with eyes, ears mouth and   genitals (private parts).  ?                     Production manager,  Genitals (private parts) with your normal soap. ?            6.  Wash thoroughly, paying special attention to the area where your    surgery  will be performed. ? 7.  Thoroughly rinse your body with warm water from the neck down. ? 8.  DO NOT shower/wash with your normal soap after using and rinsing off the CHG Soap. ?               9.  Pat yourself dry with a clean towel. ?  10.  Wear clean pajamas. ?           11.  Place clean sheets on your bed the night of your first shower and do not  sleep with pets. ?Day of Surgery : ?Do not apply any lotions/deodorants the morning of surgery.  Please wear clean clothes to the hospital/surgery center. ? ?FAILURE TO FOLLOW THESE INSTRUCTIONS MAY RESULT IN THE CANCELLATION OF YOUR SURGERY ? ?PATIENT SIGNATURE_________________________________ ? ?NURSE SIGNATURE__________________________________ ? ?________________________________________________________________________  ? ?Incentive Spirometer ? ?An incentive spirometer is a tool that can help keep your lungs clear and active. This tool measures how well you are filling your lungs with each breath. Taking long deep breaths may help reverse or decrease the chance of developing breathing (pulmonary) problems (especially infection) following: ?A long period of time when you are unable to move or be active. ?BEFORE THE PROCEDURE  ?If the spirometer includes an indicator to show your best effort, your nurse or respiratory therapist will set it to a desired goal. ?If  possible, sit up straight or lean slightly forward. Try not to slouch. ?Hold the incentive spirometer in an upright position. ?INSTRUCTIONS FOR USE  ?Sit on the edge of your bed if possible, or sit up as far as you can in bed or on a chair. ?Hold the incentive spirometer in an upright position. ?Breathe out normally. ?Place the mouthpiece in your mouth and seal your lips tightly around it. ?Breathe in slowly and as deeply as possible, raising the piston or the ball toward the top of the column. ?Hold your breath for 3-5 seconds or for as long as possible. Allow the piston or ball to fall to the bottom of the column. ?Remove the mouthpiece from your mouth and breathe out normally. ?Rest for a few seconds and repeat Steps 1 through 7 at least 10 times every 1-2 hours when you are awake. Take your time and take a few normal breaths between deep breaths. ?The spirometer may include an indicator to show your best effort. Use the indicator as a goal to work toward during each repetition. ?After each set of 10 deep breaths, practice coughing to be sure your lungs are clear. If you have an incision (the cut made at the time of surgery), support your incision when coughing by placing a pillow or rolled up towels firmly against it. ?Once you are able to get out of bed, walk around indoors and cough well. You may stop using the incentive spirometer when instructed by your caregiver.  ?RISKS AND COMPLICATIONS ?Take your time so you do not get dizzy or light-headed. ?If you are in pain, you may need to take or ask for pain medication before doing incentive spirometry. It is harder to take a deep breath if you are having pain. ?AFTER USE ?Rest and breathe slowly and easily. ?It can be helpful to keep track of a log of your progress. Your caregiver can provide you with a simple table to help with this. ?If you are using the spirometer at home, follow these instructions: ?SEEK MEDICAL CARE IF:  ?You are having difficultly using the  spirometer. ?You have trouble using the spirometer as often as instructed. ?Your pain medication is not giving enough relief while using the spirometer. ?You develop fever of 100.5? F (38.1? C) or higher. ?SEE
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
## 2022-03-18 ENCOUNTER — Other Ambulatory Visit: Payer: Self-pay

## 2022-03-18 ENCOUNTER — Telehealth: Payer: Self-pay | Admitting: *Deleted

## 2022-03-18 ENCOUNTER — Encounter (HOSPITAL_COMMUNITY)
Admission: RE | Admit: 2022-03-18 | Discharge: 2022-03-18 | Disposition: A | Discharge status: Home / Self Care | Payer: 59 | Source: Ambulatory Visit | Attending: Gynecologic Oncology | Admitting: Gynecologic Oncology

## 2022-03-18 ENCOUNTER — Encounter (HOSPITAL_COMMUNITY): Payer: Self-pay

## 2022-03-18 DIAGNOSIS — C541 Malignant neoplasm of endometrium: Secondary | ICD-10-CM | POA: Diagnosis not present

## 2022-03-18 DIAGNOSIS — Z01812 Encounter for preprocedural laboratory examination: Secondary | ICD-10-CM | POA: Insufficient documentation

## 2022-03-18 HISTORY — DX: Gastro-esophageal reflux disease without esophagitis: K21.9

## 2022-03-18 HISTORY — DX: Anemia, unspecified: D64.9

## 2022-03-18 LAB — CBC
HCT: 37.9 % (ref 36.0–46.0)
Hemoglobin: 12.4 g/dL (ref 12.0–15.0)
MCH: 29.7 pg (ref 26.0–34.0)
MCHC: 32.7 g/dL (ref 30.0–36.0)
MCV: 90.7 fL (ref 80.0–100.0)
Platelets: 224 10*3/uL (ref 150–400)
RBC: 4.18 MIL/uL (ref 3.87–5.11)
RDW: 13.4 % (ref 11.5–15.5)
WBC: 6.4 10*3/uL (ref 4.0–10.5)
nRBC: 0 % (ref 0.0–0.2)

## 2022-03-18 LAB — COMPREHENSIVE METABOLIC PANEL
ALT: 30 U/L (ref 0–44)
AST: 28 U/L (ref 15–41)
Albumin: 3.8 g/dL (ref 3.5–5.0)
Alkaline Phosphatase: 99 U/L (ref 38–126)
Anion gap: 8 (ref 5–15)
BUN: 18 mg/dL (ref 6–20)
CO2: 26 mmol/L (ref 22–32)
Calcium: 9.2 mg/dL (ref 8.9–10.3)
Chloride: 102 mmol/L (ref 98–111)
Creatinine, Ser: 0.79 mg/dL (ref 0.44–1.00)
GFR, Estimated: >60 mL/min (ref >60)
Glucose, Bld: 99 mg/dL (ref 70–99)
Potassium: 3.9 mmol/L (ref 3.5–5.1)
Sodium: 136 mmol/L (ref 135–145)
Total Bilirubin: 0.3 mg/dL (ref 0.3–1.2)
Total Protein: 7.5 g/dL (ref 6.5–8.1)

## 2022-03-18 NOTE — Telephone Encounter (Signed)
Telephone call to check on pre-operative status.  Patient compliant with pre-operative instructions.  Reinforced nothing to eat after midnight. Clear liquids until 11am. Patient to arrive at 11:50am.  Pt asked if it's possible for her to stay overnight post surgery. Educated pt that unless there are complications or the doctor feels it necessary we generally discharge pt's home the same day post surgery. Pt verbalized understanding and  ?no other questions or concerns voiced.  Instructed to call for any needs.  ?

## 2022-03-18 NOTE — Telephone Encounter (Signed)
Patient called and stated "I like to leave Dr Berline Lopes or Lenna Sciara APP a message. I know that my surgery is outpatient/same day discharge. I have wondering if I can stay over night due to how far out I live. Just for peace of mind." Explained that the message would be given to Dr Berline Lopes and Lenna Sciara APP and someone from the office will call her back  ?

## 2022-03-18 NOTE — Telephone Encounter (Signed)
Attempted to reach pt to review pre op instructions. LVM for return phone call ?

## 2022-03-18 NOTE — Telephone Encounter (Signed)
Spoke with pt this afternoon. She stated that she was just a little anxious about complications post surgery.  ?Informed her that per Joylene John, NP after surgery we will evaluate her medical needs on if she needs to stay over night or not. If it's a medical necessity then we will keep the patient over night. Pt verbalized understanding and did not voice any other concerns at this time.   ?

## 2022-03-19 ENCOUNTER — Encounter (HOSPITAL_COMMUNITY)
Admission: RE | Disposition: A | Discharge status: Home / Self Care | Payer: Self-pay | Source: Home / Self Care | Attending: Gynecologic Oncology

## 2022-03-19 ENCOUNTER — Encounter (HOSPITAL_COMMUNITY): Payer: 59

## 2022-03-19 ENCOUNTER — Other Ambulatory Visit: Payer: Self-pay

## 2022-03-19 ENCOUNTER — Encounter (HOSPITAL_COMMUNITY): Payer: Self-pay | Admitting: Gynecologic Oncology

## 2022-03-19 ENCOUNTER — Ambulatory Visit (HOSPITAL_COMMUNITY): Payer: 59 | Admitting: Physician Assistant

## 2022-03-19 ENCOUNTER — Ambulatory Visit (HOSPITAL_BASED_OUTPATIENT_CLINIC_OR_DEPARTMENT_OTHER): Payer: 59 | Admitting: Anesthesiology

## 2022-03-19 ENCOUNTER — Ambulatory Visit (HOSPITAL_COMMUNITY)
Admission: RE | Admit: 2022-03-19 | Discharge: 2022-03-20 | Disposition: A | Discharge status: Home / Self Care | Payer: 59 | Attending: Gynecologic Oncology | Admitting: Gynecologic Oncology

## 2022-03-19 DIAGNOSIS — G709 Myoneural disorder, unspecified: Secondary | ICD-10-CM

## 2022-03-19 DIAGNOSIS — I10 Essential (primary) hypertension: Secondary | ICD-10-CM

## 2022-03-19 DIAGNOSIS — Z6841 Body Mass Index (BMI) 40.0 and over, adult: Secondary | ICD-10-CM

## 2022-03-19 DIAGNOSIS — C541 Malignant neoplasm of endometrium: Secondary | ICD-10-CM | POA: Diagnosis present

## 2022-03-19 DIAGNOSIS — J45909 Unspecified asthma, uncomplicated: Secondary | ICD-10-CM | POA: Diagnosis not present

## 2022-03-19 DIAGNOSIS — N72 Inflammatory disease of cervix uteri: Secondary | ICD-10-CM | POA: Insufficient documentation

## 2022-03-19 DIAGNOSIS — M797 Fibromyalgia: Secondary | ICD-10-CM | POA: Insufficient documentation

## 2022-03-19 HISTORY — PX: ROBOTIC ASSISTED TOTAL HYSTERECTOMY WITH BILATERAL SALPINGO OOPHERECTOMY: SHX6086

## 2022-03-19 HISTORY — PX: SENTINEL NODE BIOPSY: SHX6608

## 2022-03-19 HISTORY — PX: LYMPH NODE DISSECTION: SHX5087

## 2022-03-19 LAB — TYPE AND SCREEN
ABO/RH(D): B POS
Antibody Screen: NEGATIVE

## 2022-03-19 LAB — ABO/RH: ABO/RH(D): B POS

## 2022-03-19 SURGERY — HYSTERECTOMY, TOTAL, ROBOT-ASSISTED, LAPAROSCOPIC, WITH BILATERAL SALPINGO-OOPHORECTOMY
Anesthesia: General

## 2022-03-19 MED ORDER — AMISULPRIDE (ANTIEMETIC) 5 MG/2ML IV SOLN: 10.0000 mg | Freq: Once | INTRAVENOUS | Status: DC | PRN

## 2022-03-19 MED ORDER — ENSURE PRE-SURGERY PO LIQD
296.0000 mL | Freq: Once | ORAL | Status: DC
  Filled 2022-03-19: qty 296

## 2022-03-19 MED ORDER — SENNOSIDES-DOCUSATE SODIUM 8.6-50 MG PO TABS
2.0000 | ORAL_TABLET | Freq: Every day | ORAL | Status: DC
  Administered 2022-03-19: 2 via ORAL
  Filled 2022-03-19: qty 2

## 2022-03-19 MED ORDER — ONDANSETRON HCL 4 MG PO TABS: 4.0000 mg | ORAL_TABLET | Freq: Four times a day (QID) | ORAL | Status: DC | PRN

## 2022-03-19 MED ORDER — STERILE WATER FOR IRRIGATION IR SOLN
Status: DC | PRN
  Administered 2022-03-19: 1000 mL

## 2022-03-19 MED ORDER — OXYCODONE HCL 5 MG/5ML PO SOLN: 5.0000 mg | Freq: Once | ORAL | Status: AC | PRN

## 2022-03-19 MED ORDER — FENTANYL CITRATE PF 50 MCG/ML IJ SOSY
PREFILLED_SYRINGE | INTRAMUSCULAR | Status: AC
  Filled 2022-03-19: qty 1

## 2022-03-19 MED ORDER — CHLORHEXIDINE GLUCONATE 0.12 % MT SOLN
15.0000 mL | Freq: Once | OROMUCOSAL | Status: AC
  Administered 2022-03-19: 15 mL via OROMUCOSAL

## 2022-03-19 MED ORDER — ONDANSETRON HCL 4 MG/2ML IJ SOLN: 4.0000 mg | Freq: Four times a day (QID) | INTRAMUSCULAR | Status: DC | PRN

## 2022-03-19 MED ORDER — ONDANSETRON HCL 4 MG/2ML IJ SOLN
INTRAMUSCULAR | Status: AC
  Filled 2022-03-19: qty 2

## 2022-03-19 MED ORDER — BUPIVACAINE HCL 0.25 % IJ SOLN
INTRAMUSCULAR | Status: DC | PRN
Start: 2022-03-19 — End: 2022-03-19
  Administered 2022-03-19: 30 mL

## 2022-03-19 MED ORDER — TRAMADOL HCL 50 MG PO TABS: 50.0000 mg | ORAL_TABLET | Freq: Four times a day (QID) | ORAL | Status: DC | PRN

## 2022-03-19 MED ORDER — HYDROMORPHONE HCL 1 MG/ML IJ SOLN: 0.2000 mg | INTRAMUSCULAR | Status: DC | PRN

## 2022-03-19 MED ORDER — ORAL CARE MOUTH RINSE: 15.0000 mL | Freq: Once | OROMUCOSAL | Status: AC

## 2022-03-19 MED ORDER — BUPIVACAINE HCL 0.25 % IJ SOLN
INTRAMUSCULAR | Status: AC
  Filled 2022-03-19: qty 1

## 2022-03-19 MED ORDER — MIDAZOLAM HCL 2 MG/2ML IJ SOLN
INTRAMUSCULAR | Status: AC
Start: 2022-03-19 — End: ?
  Filled 2022-03-19: qty 2

## 2022-03-19 MED ORDER — STERILE WATER FOR INJECTION IJ SOLN
INTRAMUSCULAR | Status: AC
  Filled 2022-03-19: qty 10

## 2022-03-19 MED ORDER — ROCURONIUM BROMIDE 10 MG/ML (PF) SYRINGE
PREFILLED_SYRINGE | INTRAVENOUS | Status: AC
  Filled 2022-03-19: qty 10

## 2022-03-19 MED ORDER — PROPOFOL 10 MG/ML IV BOLUS
INTRAVENOUS | Status: DC | PRN
  Administered 2022-03-19: 150 mg via INTRAVENOUS

## 2022-03-19 MED ORDER — ONDANSETRON HCL 4 MG/2ML IJ SOLN
INTRAMUSCULAR | Status: DC | PRN
  Administered 2022-03-19: 4 mg via INTRAVENOUS

## 2022-03-19 MED ORDER — PROPOFOL 10 MG/ML IV BOLUS
INTRAVENOUS | Status: AC
Start: 2022-03-19 — End: ?
  Filled 2022-03-19: qty 20

## 2022-03-19 MED ORDER — DEXAMETHASONE SODIUM PHOSPHATE 10 MG/ML IJ SOLN
INTRAMUSCULAR | Status: AC
  Filled 2022-03-19: qty 1

## 2022-03-19 MED ORDER — STERILE WATER FOR INJECTION IJ SOLN
INTRAMUSCULAR | Status: DC | PRN
  Administered 2022-03-19: 1 mL via INTRAMUSCULAR

## 2022-03-19 MED ORDER — CEFAZOLIN SODIUM-DEXTROSE 2-3 GM-%(50ML) IV SOLR
INTRAVENOUS | Status: DC | PRN
  Administered 2022-03-19: 2 g via INTRAVENOUS

## 2022-03-19 MED ORDER — SCOPOLAMINE 1 MG/3DAYS TD PT72
1.0000 | MEDICATED_PATCH | TRANSDERMAL | Status: DC
  Administered 2022-03-19: 1.5 mg via TRANSDERMAL
  Filled 2022-03-19: qty 1

## 2022-03-19 MED ORDER — CEFAZOLIN SODIUM-DEXTROSE 2-4 GM/100ML-% IV SOLN
2.0000 g | INTRAVENOUS | Status: AC
  Administered 2022-03-19: 2 g via INTRAVENOUS
  Filled 2022-03-19: qty 100

## 2022-03-19 MED ORDER — ALBUTEROL SULFATE HFA 108 (90 BASE) MCG/ACT IN AERS: 2.0000 | INHALATION_SPRAY | Freq: Four times a day (QID) | RESPIRATORY_TRACT | Status: DC | PRN

## 2022-03-19 MED ORDER — ROCURONIUM BROMIDE 10 MG/ML (PF) SYRINGE
PREFILLED_SYRINGE | INTRAVENOUS | Status: DC | PRN
  Administered 2022-03-19: 100 mg via INTRAVENOUS

## 2022-03-19 MED ORDER — ONDANSETRON HCL 4 MG/2ML IJ SOLN: 4.0000 mg | Freq: Once | INTRAMUSCULAR | Status: DC | PRN

## 2022-03-19 MED ORDER — ACETAMINOPHEN 500 MG PO TABS
1000.0000 mg | ORAL_TABLET | ORAL | Status: AC
  Administered 2022-03-19: 1000 mg via ORAL
  Filled 2022-03-19: qty 2

## 2022-03-19 MED ORDER — FENTANYL CITRATE (PF) 250 MCG/5ML IJ SOLN
INTRAMUSCULAR | Status: AC
  Filled 2022-03-19: qty 5

## 2022-03-19 MED ORDER — LIDOCAINE HCL (PF) 2 % IJ SOLN
INTRAMUSCULAR | Status: DC | PRN
  Administered 2022-03-19: 1 mg/kg/h via INTRADERMAL

## 2022-03-19 MED ORDER — KCL IN DEXTROSE-NACL 20-5-0.45 MEQ/L-%-% IV SOLN
INTRAVENOUS | Status: DC
  Filled 2022-03-19: qty 1000

## 2022-03-19 MED ORDER — LACTATED RINGERS IR SOLN
Status: DC | PRN
  Administered 2022-03-19: 1

## 2022-03-19 MED ORDER — LIDOCAINE HCL (PF) 2 % IJ SOLN
INTRAMUSCULAR | Status: AC
  Filled 2022-03-19: qty 15

## 2022-03-19 MED ORDER — OXYCODONE HCL 5 MG PO TABS
5.0000 mg | ORAL_TABLET | Freq: Once | ORAL | Status: AC | PRN
  Administered 2022-03-19: 5 mg via ORAL

## 2022-03-19 MED ORDER — ALBUTEROL SULFATE (2.5 MG/3ML) 0.083% IN NEBU
2.5000 mg | INHALATION_SOLUTION | Freq: Four times a day (QID) | RESPIRATORY_TRACT | Status: DC | PRN
Start: 2022-03-19 — End: 2022-03-20

## 2022-03-19 MED ORDER — FENTANYL CITRATE PF 50 MCG/ML IJ SOSY
25.0000 ug | PREFILLED_SYRINGE | INTRAMUSCULAR | Status: DC | PRN
  Administered 2022-03-19: 50 ug via INTRAVENOUS
  Administered 2022-03-19: 25 ug via INTRAVENOUS
  Administered 2022-03-19: 50 ug via INTRAVENOUS

## 2022-03-19 MED ORDER — OXYCODONE HCL 5 MG PO TABS
ORAL_TABLET | ORAL | Status: AC
  Filled 2022-03-19: qty 1

## 2022-03-19 MED ORDER — LACTATED RINGERS IV SOLN: INTRAVENOUS | Status: DC

## 2022-03-19 MED ORDER — FENTANYL CITRATE (PF) 100 MCG/2ML IJ SOLN
INTRAMUSCULAR | Status: DC | PRN
  Administered 2022-03-19: 50 ug via INTRAVENOUS
  Administered 2022-03-19: 100 ug via INTRAVENOUS

## 2022-03-19 MED ORDER — DEXAMETHASONE SODIUM PHOSPHATE 4 MG/ML IJ SOLN: 4.0000 mg | INTRAMUSCULAR | Status: DC

## 2022-03-19 MED ORDER — OXYCODONE HCL 5 MG PO TABS
5.0000 mg | ORAL_TABLET | ORAL | Status: DC | PRN
  Administered 2022-03-19 – 2022-03-20 (×4): 10 mg via ORAL
  Filled 2022-03-19 (×4): qty 2

## 2022-03-19 MED ORDER — HEPARIN SODIUM (PORCINE) 5000 UNIT/ML IJ SOLN
5000.0000 [IU] | INTRAMUSCULAR | Status: AC
  Administered 2022-03-19: 5000 [IU] via SUBCUTANEOUS
  Filled 2022-03-19: qty 1

## 2022-03-19 MED ORDER — ACETAMINOPHEN 500 MG PO TABS: 1000.0000 mg | ORAL_TABLET | Freq: Once | ORAL | Status: DC

## 2022-03-19 MED ORDER — KETOROLAC TROMETHAMINE 15 MG/ML IJ SOLN: 15.0000 mg | INTRAMUSCULAR | Status: DC

## 2022-03-19 MED ORDER — MIDAZOLAM HCL 5 MG/5ML IJ SOLN
INTRAMUSCULAR | Status: DC | PRN
  Administered 2022-03-19: 2 mg via INTRAVENOUS

## 2022-03-19 MED ORDER — FENTANYL CITRATE PF 50 MCG/ML IJ SOSY
PREFILLED_SYRINGE | INTRAMUSCULAR | Status: AC
Start: 2022-03-19 — End: 2022-03-19
  Administered 2022-03-19: 25 ug via INTRAVENOUS
  Filled 2022-03-19: qty 1

## 2022-03-19 MED ORDER — DEXAMETHASONE SODIUM PHOSPHATE 10 MG/ML IJ SOLN
INTRAMUSCULAR | Status: DC | PRN
  Administered 2022-03-19: 10 mg via INTRAVENOUS

## 2022-03-19 MED ORDER — SUGAMMADEX SODIUM 200 MG/2ML IV SOLN
INTRAVENOUS | Status: DC | PRN
  Administered 2022-03-19: 200 mg via INTRAVENOUS

## 2022-03-19 SURGICAL SUPPLY — 72 items
APPLICATOR SURGIFLO ENDO (HEMOSTASIS) IMPLANT
BACTOSHIELD CHG 4% 4OZ (MISCELLANEOUS) ×1
BAG COUNTER SPONGE SURGICOUNT (BAG) IMPLANT
BAG LAPAROSCOPIC 12 15 PORT 16 (BASKET) IMPLANT
BAG RETRIEVAL 10 (BASKET)
BAG RETRIEVAL 12/15 (BASKET) ×3
BLADE SURG SZ10 CARB STEEL (BLADE) IMPLANT
COVER BACK TABLE 60X90IN (DRAPES) ×3 IMPLANT
COVER SURGICAL LIGHT HANDLE (MISCELLANEOUS) ×1 IMPLANT
COVER TIP SHEARS 8 DVNC (MISCELLANEOUS) ×2 IMPLANT
COVER TIP SHEARS 8MM DA VINCI (MISCELLANEOUS) ×3
DERMABOND ADVANCED (GAUZE/BANDAGES/DRESSINGS) ×1
DERMABOND ADVANCED .7 DNX12 (GAUZE/BANDAGES/DRESSINGS) ×2 IMPLANT
DRAPE ARM DVNC X/XI (DISPOSABLE) ×8 IMPLANT
DRAPE COLUMN DVNC XI (DISPOSABLE) ×2 IMPLANT
DRAPE DA VINCI XI ARM (DISPOSABLE) ×12
DRAPE DA VINCI XI COLUMN (DISPOSABLE) ×3
DRAPE SHEET LG 3/4 BI-LAMINATE (DRAPES) ×3 IMPLANT
DRAPE SURG IRRIG POUCH 19X23 (DRAPES) ×3 IMPLANT
DRSG OPSITE POSTOP 4X6 (GAUZE/BANDAGES/DRESSINGS) IMPLANT
DRSG OPSITE POSTOP 4X8 (GAUZE/BANDAGES/DRESSINGS) IMPLANT
ELECT PENCIL ROCKER SW 15FT (MISCELLANEOUS) ×1 IMPLANT
ELECT REM PT RETURN 15FT ADLT (MISCELLANEOUS) ×3 IMPLANT
GAUZE 4X4 16PLY ~~LOC~~+RFID DBL (SPONGE) ×3 IMPLANT
GLOVE SURG ENC MOIS LTX SZ6 (GLOVE) ×12 IMPLANT
GLOVE SURG ENC MOIS LTX SZ6.5 (GLOVE) ×6 IMPLANT
GOWN STRL REUS W/ TWL LRG LVL3 (GOWN DISPOSABLE) ×8 IMPLANT
GOWN STRL REUS W/TWL LRG LVL3 (GOWN DISPOSABLE) ×12
HOLDER FOLEY CATH W/STRAP (MISCELLANEOUS) IMPLANT
IRRIG SUCT STRYKERFLOW 2 WTIP (MISCELLANEOUS) ×3
IRRIGATION SUCT STRKRFLW 2 WTP (MISCELLANEOUS) ×2 IMPLANT
KIT PROCEDURE DA VINCI SI (MISCELLANEOUS)
KIT PROCEDURE DVNC SI (MISCELLANEOUS) IMPLANT
KIT TURNOVER KIT A (KITS) IMPLANT
MANIPULATOR ADVINCU DEL 3.0 PL (MISCELLANEOUS) ×1 IMPLANT
MANIPULATOR ADVINCU DEL 3.5 PL (MISCELLANEOUS) IMPLANT
MANIPULATOR UTERINE 4.5 ZUMI (MISCELLANEOUS) IMPLANT
NDL HYPO 21X1.5 SAFETY (NEEDLE) ×2 IMPLANT
NDL SPNL 18GX3.5 QUINCKE PK (NEEDLE) IMPLANT
NEEDLE HYPO 21X1.5 SAFETY (NEEDLE) ×3 IMPLANT
NEEDLE SPNL 18GX3.5 QUINCKE PK (NEEDLE) IMPLANT
OBTURATOR OPTICAL STANDARD 8MM (TROCAR) ×3
OBTURATOR OPTICAL STND 8 DVNC (TROCAR) ×2
OBTURATOR OPTICALSTD 8 DVNC (TROCAR) ×2 IMPLANT
PACK ROBOT GYN CUSTOM WL (TRAY / TRAY PROCEDURE) ×3 IMPLANT
PAD POSITIONING PINK XL (MISCELLANEOUS) ×3 IMPLANT
PORT ACCESS TROCAR AIRSEAL 12 (TROCAR) ×2 IMPLANT
PORT ACCESS TROCAR AIRSEAL 5M (TROCAR) ×1
SCRUB CHG 4% DYNA-HEX 4OZ (MISCELLANEOUS) ×2 IMPLANT
SEAL CANN UNIV 5-8 DVNC XI (MISCELLANEOUS) ×8 IMPLANT
SEAL XI 5MM-8MM UNIVERSAL (MISCELLANEOUS) ×12
SET TRI-LUMEN FLTR TB AIRSEAL (TUBING) ×3 IMPLANT
SPIKE FLUID TRANSFER (MISCELLANEOUS) ×3 IMPLANT
SPONGE T-LAP 18X18 ~~LOC~~+RFID (SPONGE) IMPLANT
SURGIFLO W/THROMBIN 8M KIT (HEMOSTASIS) IMPLANT
SUT MNCRL AB 4-0 PS2 18 (SUTURE) IMPLANT
SUT PDS AB 1 TP1 96 (SUTURE) IMPLANT
SUT VIC AB 0 CT1 27 (SUTURE)
SUT VIC AB 0 CT1 27XBRD ANTBC (SUTURE) IMPLANT
SUT VIC AB 2-0 CT1 27 (SUTURE)
SUT VIC AB 2-0 CT1 TAPERPNT 27 (SUTURE) IMPLANT
SUT VIC AB 4-0 PS2 18 (SUTURE) ×6 IMPLANT
SYR 10ML LL (SYRINGE) IMPLANT
SYS BAG RETRIEVAL 10MM (BASKET)
SYSTEM BAG RETRIEVAL 10MM (BASKET) IMPLANT
TOWEL OR NON WOVEN STRL DISP B (DISPOSABLE) IMPLANT
TRAP SPECIMEN MUCUS 40CC (MISCELLANEOUS) IMPLANT
TRAY FOLEY MTR SLVR 16FR STAT (SET/KITS/TRAYS/PACK) ×3 IMPLANT
TROCAR XCEL NON-BLD 5MMX100MML (ENDOMECHANICALS) IMPLANT
UNDERPAD 30X36 HEAVY ABSORB (UNDERPADS AND DIAPERS) ×6 IMPLANT
WATER STERILE IRR 1000ML POUR (IV SOLUTION) ×3 IMPLANT
YANKAUER SUCT BULB TIP 10FT TU (MISCELLANEOUS) IMPLANT

## 2022-03-19 NOTE — Op Note (Signed)
OPERATIVE NOTE ? ?Pre-operative Diagnosis: endometrial cancer grade 1 ? ?Post-operative Diagnosis: same ? ?Operation: Robotic-assisted laparoscopic total hysterectomy with bilateral salpingoophorectomy, SLN biopsy  ?Morbid obesity requiring additional OR personnel for positioning and retraction. Obesity made retroperitoneal visualization limited and increased the complexity of the case and necessitated additional instrumentation for retraction. Obesity related complexity increased the duration of the procedure by 30 minutes.  ? ?Surgeon: Jeral Pinch MD ? ?Assistant Surgeon: Joylene John NP ? ?Anesthesia: GET ? ?Urine Output: 100 cc ? ?Operative Findings: On EUA, small mobile uterus, no adnexal masses. On intra-abdominal entry, normal upper abdominal survey.  Normal-appearing omentum, small and large bowel.  Filmy adhesions of the appendix to the sigmoid epiploica and right pelvic sidewall.  Clips noted on the fallopian tube consistent with prior tubal ligation.  Normal-appearing bilateral adnexa.  Uterus 8 cm and somewhat bulbous at the fundus, otherwise normal in appearance.  Mapping successful bilaterally to obturator sentinel lymph nodes.  Mildly enlarged lymph node on the left with enlarged adjacent lymph node that was also removed.  No obvious extrauterine tumor.  Mild adhesions between the bladder and lower uterine segment/cervix. ? ?Estimated Blood Loss: 50 cc     ? ?Total IV Fluids: See I&O flowsheet ?        ?Specimens: uterus, cervix, bilateral tubes and ovaries, bilateral sentinel obturator lymph nodes ?        ?Complications:  None apparent; patient tolerated the procedure well. ?        ?Disposition: PACU - hemodynamically stable. ? ?Procedure Details  ?The patient was seen in the Holding Room. The risks, benefits, complications, treatment options, and expected outcomes were discussed with the patient.  The patient concurred with the proposed plan, giving informed consent.  The site of surgery  properly noted/marked. The patient was identified as Tammy Calderon and the procedure verified as a Robotic-assisted hysterectomy with bilateral salpingo oophorectomy with SLN biopsy.  ? ?After induction of anesthesia, the patient was draped and prepped in the usual sterile manner. Patient was placed in supine position after anesthesia and draped and prepped in the usual sterile manner as follows: Her arms were tucked to her side with all appropriate precautions.  The shoulders were stabilized with padded shoulder blocks applied to the acromium processes.  The patient was placed in the semi-lithotomy position in [place].  The perineum and vagina were prepped with CholoraPrep. The patient was draped after the CholoraPrep had been allowed to dry for 3 minutes.  A Time Out was held and the above information confirmed. ? ?The urethra was prepped with Betadine. Foley catheter was placed.  A sterile speculum was placed in the vagina.  The cervix was grasped with a single-tooth tenaculum. '2mg'$  total of ICG was injected into the cervical stroma at 2 and 9 o'clock with 1cc injected at a 1cm and 86m depth (concentration 0.'5mg'$ /ml) in all locations. The cervix was dilated with PKennon Roundsdilators.  The delineator 3.0 uterine manipulator with a colpotomizer ring was placed without difficulty.  A pneum occluder balloon was placed over the manipulator.  OG tube placement was confirmed and to suction.  ? ?Next, a 10 mm skin incision was made 1 cm below the subcostal margin in the midclavicular line.  The 5 mm Optiview port and scope was used for direct entry.  Opening pressure was under 10 mm CO2.  The abdomen was insufflated and the findings were noted as above.   At this point and all points during the procedure, the  patient's intra-abdominal pressure did not exceed 15 mmHg. Next, an 8 mm skin incision was made superior to the umbilicus and a right and left port were placed about 8 cm lateral to the robot port on the right and left  side.  A fourth arm was placed on the right.  The 5 mm assist trocar was exchanged for a 10-12 mm port. All ports were placed under direct visualization.  The patient was placed in steep Trendelenburg.  Bowel was folded away into the upper abdomen.  The robot was docked in the normal manner. ? ?The right and left peritoneum were opened parallel to the IP ligament to open the retroperitoneal spaces bilaterally. The round ligaments were transected. The SLN mapping was performed in bilateral pelvic basins. After identifying the ureters, the para rectal and paravesical spaces were opened up entirely with careful dissection below the level of the ureters bilaterally and to the depth of the uterine artery origin in order to skeletonize the uterine "web" and ensure visualization of all parametrial channels. The para-aortic basins were carefully exposed and evaluated for isolated para-aortic SLN's. Lymphatic channels were identified travelling to the following visualized sentinel lymph node's: Bilateral obturator sentinel lymph nodes. These SLN's were separated from their surrounding lymphatic tissue, removed and sent for permanent pathology. ? ?The hysterectomy was started.  The ureter was again noted to be on the medial leaf of the broad ligament.  The peritoneum above the ureter was incised and stretched and the infundibulopelvic ligament was skeletonized, cauterized and cut.  The posterior peritoneum was taken down to the level of the KOH ring.  The anterior peritoneum was also taken down.  The bladder flap was created to the level of the KOH ring.  The uterine artery on the right side was skeletonized, cauterized and cut in the normal manner.  A similar procedure was performed on the left.  The colpotomy was made and the uterus, cervix, bilateral ovaries and tubes were amputated and delivered through the vagina.  Pedicles were inspected and excellent hemostasis was achieved.   ? ?The colpotomy at the vaginal cuff was  closed with Vicryl on a CT1 needle in a running manner.  Irrigation was used and excellent hemostasis was achieved.  At this point in the procedure was completed.  Robotic instruments were removed under direct visulaization.  The robot was undocked. The fascia at the 10-12 mm port was closed with 0 Vicryl on a UR-5 needle.  The subcuticular tissue was closed with 4-0 Vicryl and the skin was closed with 4-0 Monocryl in a subcuticular manner.  Mattress sutures were used for the right lateral incision.  Dermabond was applied.   ? ?The vagina was swabbed with  minimal bleeding noted.  Foley catheter was removed.  All sponge, lap and needle counts were correct x  3.  ? ?The patient was transferred to the recovery room in stable condition. ? ?Jeral Pinch, MD ? ?

## 2022-03-19 NOTE — Anesthesia Procedure Notes (Signed)
Procedure Name: Intubation ?Date/Time: 03/19/2022 3:26 PM ?Performed by: Gerald Leitz, CRNA ?Pre-anesthesia Checklist: Patient identified, Patient being monitored, Timeout performed, Emergency Drugs available and Suction available ?Patient Re-evaluated:Patient Re-evaluated prior to induction ?Oxygen Delivery Method: Circle system utilized ?Preoxygenation: Pre-oxygenation with 100% oxygen ?Induction Type: IV induction ?Ventilation: Mask ventilation without difficulty ?Laryngoscope Size: Mac and 3 ?Grade View: Grade I ?Tube type: Oral ?Tube size: 7.0 mm ?Number of attempts: 1 ?Airway Equipment and Method: Stylet ?Placement Confirmation: ETT inserted through vocal cords under direct vision, positive ETCO2 and breath sounds checked- equal and bilateral ?Secured at: 21 cm ?Tube secured with: Tape ?Dental Injury: Teeth and Oropharynx as per pre-operative assessment  ? ? ? ? ?

## 2022-03-19 NOTE — Transfer of Care (Signed)
Immediate Anesthesia Transfer of Care Note ? ?Patient: Tammy Calderon ? ?Procedure(s) Performed: Procedure(s): ?XI ROBOTIC ASSISTED TOTAL HYSTERECTOMY WITH BILATERAL SALPINGO OOPHORECTOMY (Bilateral) ?SENTINEL NODE BIOPSY (N/A) ?LYMPH NODE DISSECTION (N/A) ? ?Patient Location: PACU ? ?Anesthesia Type:General ? ?Level of Consciousness: Alert, Awake, Oriented ? ?Airway & Oxygen Therapy: Patient Spontanous Breathing ? ?Post-op Assessment: Report given to RN ? ?Post vital signs: Reviewed and stable ? ?Last Vitals:  ?Vitals:  ? 03/19/22 1149  ?BP: 138/73  ?Pulse: 90  ?Resp: 16  ?Temp: 36.9 ?C  ?SpO2: 95%  ? ? ?Complications: No apparent anesthesia complications ? ?

## 2022-03-19 NOTE — Interval H&P Note (Signed)
History and Physical Interval Note: ? ?03/19/2022 ?2:36 PM ? ?Tammy Calderon  has presented today for surgery, with the diagnosis of ENDOMETRIAL CANCER.  The various methods of treatment have been discussed with the patient and family. After consideration of risks, benefits and other options for treatment, the patient has consented to  Procedure(s): ?XI ROBOTIC ASSISTED TOTAL HYSTERECTOMY WITH BILATERAL SALPINGO OOPHORECTOMY (Bilateral) ?SENTINEL NODE BIOPSY (N/A) ?LYMPH NODE DISSECTION (N/A) ?LAPAROTOMY (N/A) as a surgical intervention.  The patient's history has been reviewed, patient examined, no change in status, stable for surgery.  I have reviewed the patient's chart and labs.  Questions were answered to the patient's satisfaction.   ? ? ?[name] ? ? ?

## 2022-03-19 NOTE — Anesthesia Preprocedure Evaluation (Signed)
Anesthesia Evaluation  ?Patient identified by MRN, date of birth, ID band ?Patient awake ? ? ? ?Reviewed: ?Allergy & Precautions, NPO status , Patient's Chart, lab work & pertinent test results ? ?History of Anesthesia Complications ?Negative for: history of anesthetic complications ? ?Airway ?Mallampati: III ? ?TM Distance: >3 FB ?Neck ROM: Full ? ? ? Dental ? ?(+) Dental Advisory Given, Teeth Intact ?  ?Pulmonary ?asthma ,  ?  ?Pulmonary exam normal ? ? ? ? ? ? ? Cardiovascular ?hypertension, Pt. on medications ?Normal cardiovascular exam ? ? ?  ?Neuro/Psych ?negative neurological ROS ?   ? GI/Hepatic ?Neg liver ROS, GERD  ,  ?Endo/Other  ?Morbid obesity ? Renal/GU ?negative Renal ROS  ?negative genitourinary ?  ?Musculoskeletal ? ?(+) Fibromyalgia - ? Abdominal ?  ?Peds ? Hematology ?negative hematology ROS ?(+)   ?Anesthesia Other Findings ? ? Reproductive/Obstetrics ?Endometrial cancer ? ?  ? ? ? ? ? ? ? ? ? ? ? ? ? ?  ?  ? ? ? ? ? ? ? ?Anesthesia Physical ?Anesthesia Plan ? ?ASA: 3 ? ?Anesthesia Plan: General  ? ?Post-op Pain Management: Tylenol PO (pre-op)*, Toradol IV (intra-op)* and Lidocaine infusion*  ? ?Induction: Intravenous ? ?PONV Risk Score and Plan: 4 or greater and Ondansetron, Dexamethasone, Treatment may vary due to age or medical condition and Midazolam ? ?Airway Management Planned: Oral ETT ? ?Additional Equipment: None ? ?Intra-op Plan:  ? ?Post-operative Plan: Extubation in OR ? ?Informed Consent: I have reviewed the patients History and Physical, chart, labs and discussed the procedure including the risks, benefits and alternatives for the proposed anesthesia with the patient or authorized representative who has indicated his/her understanding and acceptance.  ? ? ? ?Dental advisory given ? ?Plan Discussed with:  ? ?Anesthesia Plan Comments:   ? ? ? ? ? ? ?Anesthesia Quick Evaluation ? ?

## 2022-03-19 NOTE — Anesthesia Postprocedure Evaluation (Signed)
Anesthesia Post Note ? ?Patient: SHAUNIECE KWAN ? ?Procedure(s) Performed: XI ROBOTIC ASSISTED TOTAL HYSTERECTOMY WITH BILATERAL SALPINGO OOPHORECTOMY (Bilateral) ?SENTINEL NODE BIOPSY ?LYMPH NODE DISSECTION ? ?  ? ?Patient location during evaluation: PACU ?Anesthesia Type: General ?Level of consciousness: awake and alert and oriented ?Pain management: pain level controlled ?Vital Signs Assessment: post-procedure vital signs reviewed and stable ?Respiratory status: spontaneous breathing, nonlabored ventilation and respiratory function stable ?Cardiovascular status: blood pressure returned to baseline and stable ?Postop Assessment: no apparent nausea or vomiting ?Anesthetic complications: no ? ? ?No notable events documented. ? ?Last Vitals:  ?Vitals:  ? 03/19/22 1845 03/19/22 1915  ?BP: (!) 143/71 139/69  ?Pulse: 70 64  ?Resp: 15 13  ?Temp:  36.6 ?C  ?SpO2: 99% 95%  ?  ?Last Pain:  ?Vitals:  ? 03/19/22 1900  ?TempSrc:   ?PainSc: 10-Worst pain ever  ? ? ?  ?  ?  ?  ?  ?  ? ?Prynce Jacober A. ? ? ? ? ?

## 2022-03-19 NOTE — Interval H&P Note (Signed)
History and Physical Interval Note: ? ?03/19/2022 ?11:20 AM ? ?Tammy Calderon  has presented today for surgery, with the diagnosis of ENDOMETRIAL CANCER.  The various methods of treatment have been discussed with the patient and family. After consideration of risks, benefits and other options for treatment, the patient has consented to  Procedure(s): ?XI ROBOTIC ASSISTED TOTAL HYSTERECTOMY WITH BILATERAL SALPINGO OOPHORECTOMY (Bilateral) ?SENTINEL NODE BIOPSY (N/A) ?LYMPH NODE DISSECTION (N/A) ?LAPAROTOMY (N/A) as a surgical intervention.  The patient's history has been reviewed, patient examined, no change in status, stable for surgery.  I have reviewed the patient's chart and labs.  Questions were answered to the patient's satisfaction.   ? ? ?[name] ? ? ?

## 2022-03-20 ENCOUNTER — Other Ambulatory Visit: Payer: Self-pay

## 2022-03-20 ENCOUNTER — Encounter (HOSPITAL_COMMUNITY): Payer: Self-pay | Admitting: Gynecologic Oncology

## 2022-03-20 DIAGNOSIS — C541 Malignant neoplasm of endometrium: Secondary | ICD-10-CM | POA: Diagnosis not present

## 2022-03-20 LAB — CBC
HCT: 35.1 % — ABNORMAL LOW (ref 36.0–46.0)
Hemoglobin: 11.5 g/dL — ABNORMAL LOW (ref 12.0–15.0)
MCH: 29.7 pg (ref 26.0–34.0)
MCHC: 32.8 g/dL (ref 30.0–36.0)
MCV: 90.7 fL (ref 80.0–100.0)
Platelets: 216 10*3/uL (ref 150–400)
RBC: 3.87 MIL/uL (ref 3.87–5.11)
RDW: 13.4 % (ref 11.5–15.5)
WBC: 7.8 10*3/uL (ref 4.0–10.5)
nRBC: 0 % (ref 0.0–0.2)

## 2022-03-20 LAB — BASIC METABOLIC PANEL
Anion gap: 8 (ref 5–15)
BUN: 14 mg/dL (ref 6–20)
CO2: 26 mmol/L (ref 22–32)
Calcium: 9.2 mg/dL (ref 8.9–10.3)
Chloride: 100 mmol/L (ref 98–111)
Creatinine, Ser: 0.75 mg/dL (ref 0.44–1.00)
GFR, Estimated: >60 mL/min (ref >60)
Glucose, Bld: 152 mg/dL — ABNORMAL HIGH (ref 70–99)
Potassium: 4.4 mmol/L (ref 3.5–5.1)
Sodium: 134 mmol/L — ABNORMAL LOW (ref 135–145)

## 2022-03-20 MED ORDER — IBUPROFEN 800 MG PO TABS: 800.0000 mg | ORAL_TABLET | Freq: Three times a day (TID) | ORAL | 0 refills | Status: DC | PRN

## 2022-03-20 NOTE — Discharge Instructions (Addendum)
AFTER SURGERY INSTRUCTIONS ?  ?Return to work: 4-6 weeks if applicable ? ?You can wear the abdominal binder when up moving around to offer support.  ? ?Do not resume your arimidex for 5 days after surgery. ?  ?Activity: ?1. Be up and out of the bed during the day.  Take a nap if needed.  You may walk up steps but be careful and use the hand rail.  Stair climbing will tire you more than you think, you may need to stop part way and rest.  ?  ?2. No lifting or straining for 6 weeks over 10 pounds. No pushing, pulling, straining for 6 weeks. ?  ?3. No driving for around 1 week(s).  Do not drive if you are taking narcotic pain medicine and make sure that your reaction time has returned.  ?  ?4. You can shower as soon as the next day after surgery. Shower daily.  Use your regular soap and water (not directly on the incision) and pat your incision(s) dry afterwards; don't rub.  No tub baths or submerging your body in water until cleared by your surgeon. If you have the soap that was given to you by pre-surgical testing that was used before surgery, you do not need to use it afterwards because this can irritate your incisions.  ?  ?5. No sexual activity and nothing in the vagina for 8 weeks. ?  ?6. You may experience a small amount of clear drainage from your incisions, which is normal.  If the drainage persists, increases, or changes color please call the office. ?  ?7. Do not use creams, lotions, or ointments such as neosporin on your incisions after surgery until advised by your surgeon because they can cause removal of the dermabond glue on your incisions.   ?  ?8. You may experience vaginal spotting after surgery or around the 6-8 week mark from surgery when the stitches at the top of the vagina begin to dissolve.  The spotting is normal but if you experience heavy bleeding, call our office. ?  ?9. Take Tylenol or ibuprofen first for pain and only use narcotic pain medication for severe pain not relieved by the Tylenol  or Ibuprofen.  Monitor your Tylenol intake to a max of 4,000 mg in a 24 hour period. You can alternate these medications after surgery. ?  ?Diet: ?1. Low sodium Heart Healthy Diet is recommended but you are cleared to resume your normal (before surgery) diet after your procedure. ?  ?2. It is safe to use a laxative, such as Miralax or Colace, if you have difficulty moving your bowels. You have been prescribed Sennakot-S to take at bedtime every evening after surgery to keep bowel movements regular and to prevent constipation.   ?  ?Wound Care: ?1. Keep clean and dry.  Shower daily. ?  ?Reasons to call the Doctor: ?Fever - Oral temperature greater than 100.4 degrees Fahrenheit ?Foul-smelling vaginal discharge ?Difficulty urinating ?Nausea and vomiting ?Increased pain at the site of the incision that is unrelieved with pain medicine. ?Difficulty breathing with or without chest pain ?New calf pain especially if only on one side ?Sudden, continuing increased vaginal bleeding with or without clots. ?  ?Contacts: ?For questions or concerns you should contact: ?  ?Dr. Jeral Pinch at [phone] ?  ?Joylene John, NP at [phone] ?  ?After Hours: call [phone] and have the GYN Oncologist paged/contacted (after 5 pm or on the weekends). ?  ?Messages sent via mychart are for non-urgent  matters and are not responded to after hours so for urgent needs, please call the after hours number. ?

## 2022-03-20 NOTE — Discharge Summary (Signed)
?Physician Discharge Summary  ?Patient ID: ?Tammy Calderon ?MRN: [id number] ?DOB/AGE: 1966/02/02 56 y.o. ? ?Admit date: 03/19/2022 ?Discharge date: 03/20/2022 ? ?Admission Diagnoses: Endometrial cancer ([place]) ? ?Discharge Diagnoses:  ?Principal Problem: ?  Endometrial cancer ([place]) ? ? ?Discharged Condition:  The patient is in good condition and stable for discharge.   ? ?Hospital Course: On 03/19/2022, the patient underwent the following: Procedure(s): XI ROBOTIC ASSISTED TOTAL HYSTERECTOMY WITH BILATERAL SALPINGO OOPHORECTOMY, SENTINEL NODE BIOPSY, LYMPH NODE DISSECTION. Due to the complexity of the case and the late hour at the completion of surgery, the patient was kept overnight and observed. The postoperative course was uneventful.  She was discharged to home on postoperative day 1 tolerating a regular diet, ambulating, passing flatus, voiding, pain controlled with oral medications. ? ?Consults: None ? ?Significant Diagnostic Studies: Labs ? ?Treatments: surgery: see above ? ?Discharge Exam: ?Blood pressure (!) 155/77, pulse 81, temperature 98.9 ?F (37.2 ?C), temperature source Oral, resp. rate 15, height 5' 0.5" (1.537 m), weight 244 lb 0.8 oz (110.7 kg), last menstrual period 10/06/2011, SpO2 96 %. ?General appearance: alert, cooperative, and no distress ?Resp: clear to auscultation bilaterally ?Cardio: regular rate and rhythm, S1, S2 normal, no murmur, click, rub or gallop ?GI: soft, non-tender; bowel sounds normal; no masses,  no organomegaly ?Extremities: extremities normal, atraumatic, no cyanosis or edema ?Incision/Wound: Lap sites to the abdomen with dermabond intact with no drainage present ? ?Disposition: Discharge disposition: 01-Home or Self Care ? ? ? ? ? ? ?Discharge Instructions   ? ? Call MD for:  difficulty breathing, headache or visual disturbances   Complete by: As directed ?  ? Call MD for:  extreme fatigue   Complete by: As directed ?  ? Call MD for:  hives   Complete by: As directed ?  ? Call MD  for:  persistant dizziness or light-headedness   Complete by: As directed ?  ? Call MD for:  persistant nausea and vomiting   Complete by: As directed ?  ? Call MD for:  redness, tenderness, or signs of infection (pain, swelling, redness, odor or green/yellow discharge around incision site)   Complete by: As directed ?  ? Call MD for:  severe uncontrolled pain   Complete by: As directed ?  ? Call MD for:  temperature >100.4   Complete by: As directed ?  ? Diet - low sodium heart healthy   Complete by: As directed ?  ? Driving Restrictions   Complete by: As directed ?  ? No driving for around 1 week.  Do not take narcotics and drive. You need to make sure your reaction time has returned.  ? Increase activity slowly   Complete by: As directed ?  ? Lifting restrictions   Complete by: As directed ?  ? No lifting greater than 10 lbs, pulling, pushing, straining for 6 weeks.  ? No wound care   Complete by: As directed ?  ? Sexual Activity Restrictions   Complete by: As directed ?  ? No sexual activity, nothing in the vagina, for 8 weeks.  ? ?  ? ?Allergies as of 03/20/2022   ? ?   Reactions  ? Misc. Sulfonamide Containing Compounds Anaphylaxis  ? Sulfa Antibiotics Anaphylaxis  ? Cymbalta [duloxetine Hcl]   ? sedated  ? Lyrica [pregabalin]   ? drowsy  ? Neurontin [gabapentin]   ? drowsy  ? ?  ? ?  ?Medication List  ?  ? ?STOP taking these medications   ? ?  anastrozole 1 MG tablet ?Commonly known as: ARIMIDEX ?  ? ?  ? ?TAKE these medications   ? ?albuterol 108 (90 Base) MCG/ACT inhaler ?Commonly known as: VENTOLIN HFA ?Inhale 2 puffs into the lungs every 6 (six) hours as needed for wheezing. ?  ?hydrocortisone 2.5 % cream ?Apply topically 2 (two) times daily. ?  ?ibuprofen 800 MG tablet ?Commonly known as: ADVIL ?Take 1 tablet (800 mg total) by mouth every 8 (eight) hours as needed for moderate pain. ?What changed:  ?medication strength ?how much to take ?  ?lisinopril-hydrochlorothiazide 20-12.5 MG tablet ?Commonly known  as: ZESTORETIC ?Take 1 tablet by mouth once daily ?  ?ondansetron 4 MG tablet ?Commonly known as: Zofran ?Take 1 tablet (4 mg total) by mouth every 8 (eight) hours as needed. ?  ?oxycodone 5 MG capsule ?Commonly known as: OXY-IR ?Take 1 capsule (5 mg total) by mouth every 4 (four) hours as needed (severe pain). For AFTER surgery only, do not take and drive ?  ?senna-docusate 8.6-50 MG tablet ?Commonly known as: Senokot-S ?Take 2 tablets by mouth at bedtime. For AFTER surgery, do not take if having diarrhea ?  ?Skintegrity Hydrogel Gel ?Apply 1 application. topically 2 (two) times daily. ?  ? ?  ? ? Follow-up Information   ? ? [name], MD Follow up on 03/26/2022.   ?Specialty: Gynecologic Oncology ?Why: at 4:20pm will be a PHONE call with Dr. Berline Lopes to check in and discuss final pathology. IN PERSON visit will be on  04/11/22 at 2:45pm at the [facility], ?Contact information: ?[address] ?[place] [address] ?[phone] ? ? ?  ?  ? ?  ?  ? ?  ? ? ?Greater than thirty minutes were spend for face to face discharge instructions and discharge orders/summary in EPIC.  ? ?Signed: ?Daylynn Stumpp D Angelyne Terwilliger ?03/20/2022, 1:55 PM ? ? ? ?  ?

## 2022-03-20 NOTE — Progress Notes (Signed)
1 Day Post-Op Procedure(s) (LRB): ?XI ROBOTIC ASSISTED TOTAL HYSTERECTOMY WITH BILATERAL SALPINGO OOPHORECTOMY (Bilateral) ?SENTINEL NODE BIOPSY (N/A) ?LYMPH NODE DISSECTION (N/A) ? ?Subjective: ?Patient reports doing well.  She had some increased pain this morning which she rated at an 8 out of 10, responded well to narcotic medication.  Has ambulated only to the bathroom, denies any difficulty urinating although feels that it is a little bit harder to empty her bladder.  Has tolerated liquids and solids without nausea or emesis.  Denies any flatus.  Denies vaginal bleeding, only small amount of spotting after surgery. ? ?Objective: ?Vital signs in last 24 hours: ?Temp:  [97.7 ?F (36.5 ?C)-98.7 ?F (37.1 ?C)] 98.3 ?F (36.8 ?C) (03/30 0533) ?Pulse Rate:  [56-73] 70 (03/30 0533) ?Resp:  [12-18] 16 (03/30 0533) ?BP: (118-158)/(68-104) 118/84 (03/30 0533) ?SpO2:  [95 %-100 %] 95 % (03/30 0533) ?Last BM Date : 03/18/22 ? ?Intake/Output from previous day: ?03/29 0701 - 03/30 0700 ?In: 1901 [I.V.:1751; IV Piggyback:150] ?Out: 750 [Urine:700; Blood:50] ? ?Physical Examination: ?General: No acute distress, alert and oriented ?Cardiovascular: Regular rate and rhythm, no murmurs, rubs or gallops appreciated ?Pulmonary: Lungs clear to auscultation bilaterally in anterior lung fields, no wheezes ?Abdomen: Soft, nondistended, appropriately tender, mildly hypoactive bowel sounds.  Incisions are clean, dry, intact, minimal ecchymoses. ?Extremities: Warm and well perfused, SCDs in place, no calf tenderness to palpation or edema ? ?Labs: ?WBC/Hgb/Hct/Plts:  7.8/11.5/35.1/216 (03/30 0448) BUN/Cr/glu/ALT/AST/amyl/lip:  14/0.75/--/--/--/--/-- (03/30 0448) ? ? ?Assessment: ? ?56 y.o. s/p Procedure(s): ?XI ROBOTIC ASSISTED TOTAL HYSTERECTOMY WITH BILATERAL SALPINGO OOPHORECTOMY ?SENTINEL NODE BIOPSY ?LYMPH NODE DISSECTION: Stable, meeting most early milestones ? ?Postop: Patient is overall doing well.  Discussed plan to DC IV fluids this  morning.  Encouraged increased ambulation as well as using none narcotic medications to treat baseline pain with narcotic if needed for breakthrough pain.  Awaiting return of bowel function.  Voiding freely. ? ?Hypertension: Holding patient's home blood pressure medication, we will plan to have her started on discharge. ? ?DVT prophylaxis: SCDs, ambulation. ? ?Plan: ?Plan to discharge to home later today. ?The patient is to be discharged to home. ? ? LOS: 0 days  ? ? ?Tammy Calderon ?03/20/2022, 12:20 PM ? ? ? ?  ? ?

## 2022-03-20 NOTE — Progress Notes (Signed)
Discharge instructions given to patient and all questions were answered.  

## 2022-03-21 ENCOUNTER — Telehealth: Payer: Self-pay | Admitting: *Deleted

## 2022-03-21 ENCOUNTER — Other Ambulatory Visit: Payer: Self-pay

## 2022-03-21 ENCOUNTER — Encounter (HOSPITAL_COMMUNITY): Payer: 59

## 2022-03-21 NOTE — Telephone Encounter (Signed)
Attempted to speak with pt today post op. Unable to reach pt. LVM for a return phone call.  ?

## 2022-03-21 NOTE — Telephone Encounter (Signed)
Spoke with Tammy Calderon this morning. She states she is eating, drinking and urinating well. She has not had a BM yet but is passing gas. She is taking senokot as prescribed and encouraged her to drink plenty of water. She denies fever or chills. Incisions are dry and intact. She rates her pain 7/10. Her pain is controlled with ibuprofen and oxycodone.  Also informed pt she can use heat or ice pack to help alleviate some of the pain as well. She stated she has not been moving around much due to the pain but knows she needs to walk a little bit more. Encourage walking to prevent blood clots and pneumonia post surgery. Pt is using her incentive spirometer. Pt verbalized understanding and did not have any other concerns.  ? ?Instructed to call office with any fever, chills, purulent drainage, uncontrolled pain or any other questions or concerns. Patient verbalizes understanding.  ? ?Pt aware of post op appointments as well as the office number [phone] and after hours number [phone] to call if she has any questions or concerns  ?

## 2022-03-24 ENCOUNTER — Encounter (HOSPITAL_COMMUNITY): Payer: 59 | Admitting: Physical Therapy

## 2022-03-26 ENCOUNTER — Inpatient Hospital Stay: Payer: 59 | Attending: Gynecologic Oncology | Admitting: Gynecologic Oncology

## 2022-03-26 ENCOUNTER — Telehealth: Payer: Self-pay | Admitting: *Deleted

## 2022-03-26 ENCOUNTER — Encounter (HOSPITAL_COMMUNITY): Payer: 59 | Admitting: Physical Therapy

## 2022-03-26 ENCOUNTER — Encounter: Payer: Self-pay | Admitting: Gynecologic Oncology

## 2022-03-26 DIAGNOSIS — Z90722 Acquired absence of ovaries, bilateral: Secondary | ICD-10-CM

## 2022-03-26 DIAGNOSIS — C541 Malignant neoplasm of endometrium: Secondary | ICD-10-CM

## 2022-03-26 DIAGNOSIS — Z7189 Other specified counseling: Secondary | ICD-10-CM

## 2022-03-26 DIAGNOSIS — Z90710 Acquired absence of both cervix and uterus: Secondary | ICD-10-CM

## 2022-03-26 NOTE — Progress Notes (Signed)
Gynecologic Oncology Telehealth Consult Note: Gyn-Onc ? ?I connected with Tammy Calderon on 03/26/22 at  4:20 PM EDT by telephone and verified that I am speaking with the correct person using two identifiers. ? ?I discussed the limitations, risks, security and privacy concerns of performing an evaluation and management service by telemedicine and the availability of in-person appointments. I also discussed with the patient that there may be a patient responsible charge related to this service. The patient expressed understanding and agreed to proceed. ? ?Other persons participating in the visit and their role in the encounter: none. ? ?Patient's location: home ?Provider's location: [facility] ? ?Reason for Visit: follow-up after surgery, treatment planning ? ?Treatment History: ?03/19/22: TRH/BSO, bilateral SLN biopsy for grade 1 endometrioid adenocarcinoma ? ?Interval History: ?Sore. Trying to get up to move. ?Bowels working normally, using sennakot daily still. ?Occasionally feels like not emptying her bladder, but this isn't happening every time. ?Very little spotting after straining with bowel movement. ?Appetite little decreased, some nausea starting two days ago. Denies emesis. Still tolerating PO. ? ?Past Medical/Surgical History: ?Past Medical History:  ?Diagnosis Date  ? Anemia   ? past  ? Asthma   ? uses albuterol approx 1-2x a week  ? Bell's palsy   ? resolved  ? Breast cancer ([place])   ? Fibromyalgia   ? GERD (gastroesophageal reflux disease)   ? Hypertension   ? PONV (postoperative nausea and vomiting)   ? ? ?Past Surgical History:  ?Procedure Laterality Date  ? BREAST BIOPSY WITH RADIO FREQUENCY LOCALIZER Left 12/27/2021  ? Procedure: EXCISIONAL BREAST BIOPSY WITH RADIO FREQUENCY LOCALIZER X2;  Surgeon: Virl Cagey, MD;  Location: [facility];  Service: General;  Laterality: Left;  ? LYMPH NODE DISSECTION N/A 03/19/2022  ? Procedure: LYMPH NODE DISSECTION;  Surgeon: [name], MD;  Location: [facility];   Service: Gynecology;  Laterality: N/A;  ? NASAL SEPTUM SURGERY    ? ROBOTIC ASSISTED TOTAL HYSTERECTOMY WITH BILATERAL SALPINGO OOPHERECTOMY Bilateral 03/19/2022  ? Procedure: XI ROBOTIC ASSISTED TOTAL HYSTERECTOMY WITH BILATERAL SALPINGO OOPHORECTOMY;  Surgeon: [name], MD;  Location: [facility];  Service: Gynecology;  Laterality: Bilateral;  ? SENTINEL NODE BIOPSY N/A 03/19/2022  ? Procedure: SENTINEL NODE BIOPSY;  Surgeon: [name], MD;  Location: [facility];  Service: Gynecology;  Laterality: N/A;  ? TUBAL LIGATION    ? WISDOM TOOTH EXTRACTION    ? ? ?Family History  ?Adopted: Yes  ?Problem Relation Age of Onset  ? Uterine cancer Mother   ? ? ?Social History  ? ?Socioeconomic History  ? Marital status: Married  ?  Spouse name: Not on file  ? Number of children: Not on file  ? Years of education: Not on file  ? Highest education level: Not on file  ?Occupational History  ? Not on file  ?Tobacco Use  ? Smoking status: Never  ? Smokeless tobacco: Never  ?Vaping Use  ? Vaping Use: Never used  ?Substance and Sexual Activity  ? Alcohol use: No  ? Drug use: Never  ? Sexual activity: Yes  ?  Birth control/protection: Post-menopausal  ?Other Topics Concern  ? Not on file  ?Social History Narrative  ? Not on file  ? ?Social Determinants of Health  ? ?Financial Resource Strain: Not on file  ?Food Insecurity: Not on file  ?Transportation Needs: Not on file  ?Physical Activity: Not on file  ?Stress: Not on file  ?Social Connections: Not on file  ? ? ?Current  Medications: ? ?Current Outpatient Medications:  ?  albuterol (VENTOLIN HFA) 108 (90 Base) MCG/ACT inhaler, Inhale 2 puffs into the lungs every 6 (six) hours as needed for wheezing., Disp: 1 each, Rfl: 2 ?  hydrocortisone 2.5 % cream, Apply topically 2 (two) times daily., Disp: 30 g, Rfl: 0 ?  ibuprofen (ADVIL) 800 MG tablet, Take 1 tablet (800 mg total) by mouth every 8 (eight) hours as needed for moderate pain., Disp: 30 tablet, Rfl: 0 ?   lisinopril-hydrochlorothiazide (ZESTORETIC) 20-12.5 MG tablet, Take 1 tablet by mouth once daily, Disp: 30 tablet, Rfl: 1 ?  ondansetron (ZOFRAN) 4 MG tablet, Take 1 tablet (4 mg total) by mouth every 8 (eight) hours as needed., Disp: 30 tablet, Rfl: 1 ?  oxycodone (OXY-IR) 5 MG capsule, Take 1 capsule (5 mg total) by mouth every 4 (four) hours as needed (severe pain). For AFTER surgery only, do not take and drive, Disp: 10 capsule, Rfl: 0 ?  senna-docusate (SENOKOT-S) 8.6-50 MG tablet, Take 2 tablets by mouth at bedtime. For AFTER surgery, do not take if having diarrhea, Disp: 30 tablet, Rfl: 0 ?  Wound Dressings (SKINTEGRITY HYDROGEL) GEL, Apply 1 application. topically 2 (two) times daily. (Patient not taking: Reported on 03/06/2022), Disp: 120 mL, Rfl: 3 ? ?Review of Symptoms: ?Pertinent positives as per HPI. ? ?Physical Exam: ?LMP 10/06/2011  ?Deferred given limitations of phone visit. ? ?Laboratory & Radiologic Studies: ?A. SENTINEL LYMPH NODE, RIGHT OBTURATOR, BIOPSY:  ?One benign lymph node, negative for carcinoma by IHC (0/1)  ? ?B. SENTINEL LYMPH NODE, LEFT OBTURATOR, AND ADJACENT LYMPH NODE, BIOPSY:  ?Five benign lymph nodes, negative for carcinoma biopsy (0/5)  ? ?C. UTERUS, CERVIX, BILATERAL TUBES AND OVARIES:  ?Invasive well differentiated endometrioid adenocarcinoma with focal  ?squamous morular metaplasia, FIGO 1  ?Tumor invades less than 50% of myometrium (3 mm of 16 mm, 18%)  ?Tumor involves the entire endometrium and is multifocally superficially  ?invasive  ?Background atypical hyperplasia  ?Chronic cervicitis with squamous metaplasia  ?Benign fallopian tubes and ovaries  ? ?Assessment & Plan: ?Tammy Calderon is a 56 y.o. woman with Stage IA grade 1 endometrioid endometrial adenocarcinoma who presents for follow-up up phone. ? ?Patient is overall doing well and meeting postoperative milestones.  She takes Tums for GERD.  I encouraged her to do this a little bit more frequently and to make sure that  she is eating when she takes medicine for pain.  Luckily her nausea is not very bothersome.  I have asked her to call me in the next couple of days if nausea does not improve or if she develops associated symptoms like emesis.  With regard to pain medication, I encouraged her to try using only ibuprofen to control postoperative pain given that she is a week out from surgery.  She has 1 or 2 oxycodone left and will call if she has trouble with pain control using only NSAIDs. ? ?Review pathology from surgery.  Patient very happy with this news.  No intermediate-high risk features, no indication for adjuvant therapy.  We will plan to discuss surveillance schedule at her in person visit. ? ?I discussed the assessment and treatment plan with the patient. The patient was provided with an opportunity to ask questions and all were answered. The patient agreed with the plan and demonstrated an understanding of the instructions.  ? ?The patient was advised to call back or see an in-person evaluation if the symptoms worsen or if the condition fails to  improve as anticipated.  ? ?18 minutes of total time was spent for this patient encounter, including preparation, face-to-face counseling with the patient and coordination of care, and documentation of the encounter. ? ? ?Jeral Pinch, MD  ?Division of Gynecologic Oncology  ?Department of Obstetrics and Gynecology  ?[facility]  ? ?

## 2022-03-26 NOTE — Telephone Encounter (Signed)
Patient called and stated she wanted a refill on her Oxy IR. Patient stated "I am still having pain since surgery on 3/29. I do rotate the Oxy with the ibuprofen every 8 hours and the works good. I can't really take tylenol because of my kidneys. I have 1 pill left." Patient's phone number and pharmacy verified  ?

## 2022-03-27 ENCOUNTER — Other Ambulatory Visit: Payer: Self-pay | Admitting: Gynecologic Oncology

## 2022-03-27 DIAGNOSIS — C541 Malignant neoplasm of endometrium: Secondary | ICD-10-CM

## 2022-03-27 MED ORDER — OXYCODONE HCL 5 MG PO CAPS: 5.0000 mg | ORAL_CAPSULE | ORAL | 0 refills | Status: DC | PRN

## 2022-03-27 NOTE — Telephone Encounter (Signed)
Received call from Tammy Calderon this morning requesting a refill on her oxycodone. Patient states " I spoke with Dr. Berline Lopes last night and she had wanted me to try taking ibuprofen and see how my pain did. I only took ibuprofen yesterday, my pain increased, I could not sleep and I kept waking up in pain. The ibuprofen helps some and so does the heating pad but my pain is still an 8/10. It's in my right lower abdomen and its like an ache but is sharp at times. I noticed if I've been up and moving/walking my pain increases." ? ?Patient reports having regular bowel movements that have been loose. She is still taking senokot nightly. Advised to hold senokot if having loose stools. Instructed patient that her message will be given to the provider and someone from the office will call and follow up with her. Patient verbalized understanding.  ?

## 2022-03-27 NOTE — Progress Notes (Signed)
See RN note. Pt called requesting refill on oxycodone this am for acute post-op pain. ?

## 2022-03-27 NOTE — Telephone Encounter (Signed)
Spoke with Tammy Calderon and notified her that her prescription refill has been sent to her pharmacy and to call with any needs. Patient verbalized understanding.  ?

## 2022-03-28 ENCOUNTER — Telehealth: Payer: Self-pay | Admitting: *Deleted

## 2022-03-28 ENCOUNTER — Encounter (HOSPITAL_COMMUNITY): Payer: 59 | Admitting: Physical Therapy

## 2022-03-28 NOTE — Telephone Encounter (Signed)
Attempt to reach pt to assess her pain but unable to reach her. LVM for return call.  ?

## 2022-03-31 ENCOUNTER — Telehealth: Payer: Self-pay | Admitting: *Deleted

## 2022-03-31 NOTE — Telephone Encounter (Signed)
Attempted to f/u with pt. Unable to reach pt. LVM for return call.  

## 2022-03-31 NOTE — Telephone Encounter (Signed)
Spoke with pt this afternoon. Pt stated she is feeling better and her pain is now a 5/10 with ibuprofen.  Her nausea is intermittent and she has not had any vomiting recently. She experiences some acid reflux but takes some Tums for relief. She reports no complications in her bowel or bladder and states that she is eating and drinking well. She stated she feels like she is getting better. Joylene John, NP made aware.  ?

## 2022-04-01 ENCOUNTER — Inpatient Hospital Stay (HOSPITAL_COMMUNITY): Payer: 59

## 2022-04-01 LAB — SURGICAL PATHOLOGY

## 2022-04-02 ENCOUNTER — Other Ambulatory Visit: Payer: Self-pay | Admitting: Family Medicine

## 2022-04-03 ENCOUNTER — Inpatient Hospital Stay (HOSPITAL_COMMUNITY): Payer: 59 | Attending: Hematology

## 2022-04-03 DIAGNOSIS — Z8049 Family history of malignant neoplasm of other genital organs: Secondary | ICD-10-CM | POA: Diagnosis not present

## 2022-04-03 DIAGNOSIS — Z90710 Acquired absence of both cervix and uterus: Secondary | ICD-10-CM | POA: Diagnosis not present

## 2022-04-03 DIAGNOSIS — Z923 Personal history of irradiation: Secondary | ICD-10-CM | POA: Diagnosis not present

## 2022-04-03 DIAGNOSIS — E559 Vitamin D deficiency, unspecified: Secondary | ICD-10-CM | POA: Diagnosis not present

## 2022-04-03 DIAGNOSIS — R232 Flushing: Secondary | ICD-10-CM | POA: Diagnosis not present

## 2022-04-03 DIAGNOSIS — D0512 Intraductal carcinoma in situ of left breast: Secondary | ICD-10-CM | POA: Insufficient documentation

## 2022-04-03 DIAGNOSIS — M8580 Other specified disorders of bone density and structure, unspecified site: Secondary | ICD-10-CM | POA: Diagnosis not present

## 2022-04-03 DIAGNOSIS — Z8542 Personal history of malignant neoplasm of other parts of uterus: Secondary | ICD-10-CM | POA: Insufficient documentation

## 2022-04-03 DIAGNOSIS — R748 Abnormal levels of other serum enzymes: Secondary | ICD-10-CM | POA: Diagnosis not present

## 2022-04-03 LAB — CBC WITH DIFFERENTIAL/PLATELET
Abs Immature Granulocytes: 0.04 10*3/uL (ref 0.00–0.07)
Basophils Absolute: 0.1 10*3/uL (ref 0.0–0.1)
Basophils Relative: 2 %
Eosinophils Absolute: 1 10*3/uL — ABNORMAL HIGH (ref 0.0–0.5)
Eosinophils Relative: 15 %
HCT: 36.7 % (ref 36.0–46.0)
Hemoglobin: 11.7 g/dL — ABNORMAL LOW (ref 12.0–15.0)
Immature Granulocytes: 1 %
Lymphocytes Relative: 16 %
Lymphs Abs: 1 10*3/uL (ref 0.7–4.0)
MCH: 28.8 pg (ref 26.0–34.0)
MCHC: 31.9 g/dL (ref 30.0–36.0)
MCV: 90.4 fL (ref 80.0–100.0)
Monocytes Absolute: 0.4 10*3/uL (ref 0.1–1.0)
Monocytes Relative: 6 %
Neutro Abs: 3.9 10*3/uL (ref 1.7–7.7)
Neutrophils Relative %: 60 %
Platelets: 227 10*3/uL (ref 150–400)
RBC: 4.06 MIL/uL (ref 3.87–5.11)
RDW: 13.6 % (ref 11.5–15.5)
WBC: 6.5 10*3/uL (ref 4.0–10.5)
nRBC: 0 % (ref 0.0–0.2)

## 2022-04-03 LAB — COMPREHENSIVE METABOLIC PANEL
ALT: 19 U/L (ref 0–44)
AST: 20 U/L (ref 15–41)
Albumin: 4 g/dL (ref 3.5–5.0)
Alkaline Phosphatase: 132 U/L — ABNORMAL HIGH (ref 38–126)
Anion gap: 7 (ref 5–15)
BUN: 24 mg/dL — ABNORMAL HIGH (ref 6–20)
CO2: 27 mmol/L (ref 22–32)
Calcium: 9.5 mg/dL (ref 8.9–10.3)
Chloride: 106 mmol/L (ref 98–111)
Creatinine, Ser: 0.79 mg/dL (ref 0.44–1.00)
GFR, Estimated: >60 mL/min (ref >60)
Glucose, Bld: 100 mg/dL — ABNORMAL HIGH (ref 70–99)
Potassium: 3.9 mmol/L (ref 3.5–5.1)
Sodium: 140 mmol/L (ref 135–145)
Total Bilirubin: 0.7 mg/dL (ref 0.3–1.2)
Total Protein: 7.7 g/dL (ref 6.5–8.1)

## 2022-04-03 LAB — VITAMIN D 25 HYDROXY (VIT D DEFICIENCY, FRACTURES): Vit D, 25-Hydroxy: 15.3 ng/mL — ABNORMAL LOW (ref 30–100)

## 2022-04-08 ENCOUNTER — Ambulatory Visit (HOSPITAL_COMMUNITY): Payer: 59 | Admitting: Hematology

## 2022-04-08 ENCOUNTER — Telehealth: Payer: Self-pay | Admitting: *Deleted

## 2022-04-08 NOTE — Telephone Encounter (Signed)
RETURNED PATIENT'S PHONE CALL, LVM FOR A RETURN CALL 

## 2022-04-09 ENCOUNTER — Ambulatory Visit: Payer: 59 | Admitting: Radiation Oncology

## 2022-04-09 ENCOUNTER — Inpatient Hospital Stay (HOSPITAL_BASED_OUTPATIENT_CLINIC_OR_DEPARTMENT_OTHER): Payer: 59 | Admitting: Hematology

## 2022-04-09 VITALS — HR 93 | Temp 98.3°F | Resp 18 | Ht 61.0 in | Wt 246.7 lb

## 2022-04-09 DIAGNOSIS — D0512 Intraductal carcinoma in situ of left breast: Secondary | ICD-10-CM

## 2022-04-09 MED ORDER — ERGOCALCIFEROL 1.25 MG (50000 UT) PO CAPS: 50000.0000 [IU] | ORAL_CAPSULE | ORAL | 1 refills | Status: DC

## 2022-04-09 MED ORDER — ANASTROZOLE 1 MG PO TABS: 1.0000 mg | ORAL_TABLET | Freq: Every day | ORAL | 6 refills | Status: DC

## 2022-04-09 MED ORDER — GABAPENTIN 300 MG PO CAPS: 300.0000 mg | ORAL_CAPSULE | Freq: Three times a day (TID) | ORAL | 6 refills | Status: DC

## 2022-04-09 NOTE — Progress Notes (Signed)
? ?[facility] ?[address] ?[place], [place] [address] ? ? ?Patient Care Team: ?Kathyrn Drown, MD as PCP - General (Family Medicine) ?Derek Jack, MD as Medical Oncologist (Medical Oncology) ?Brien Mates, RN as Oncology Nurse Navigator (Medical Oncology) ? ?SUMMARY OF ONCOLOGIC HISTORY: ?Oncology History Overview Note  ?MMR IHC intact ?  ?Endometrial cancer ([place])  ?03/07/2022 Initial Diagnosis  ? Endometrial cancer ([place]) ?  ? ? ?CHIEF COMPLIANT: Follow-up of left breast DCIS ? ? ?INTERVAL HISTORY: Tammy Calderon is a 56 y.o. female here today for follow up of her left breast DCIS. Her last visit was on 01/20/2022.  ? ?Today she reports feeling well. She had a hysterectomy 3 weeks ago, and she stopped taking anastrozole 3 days before her hysterectomy. She reports hot flashes which disrupt her sleep at night; they started in January when she started anastrozole, but they have not resolved since stopping anastrozole. She also reports nausea accompanying the hot flashes. She reports she has previously taken over the counter vitamin D, but she has not previously taken prescription vitamin D.  ? ?REVIEW OF SYSTEMS:   ?Review of Systems  ?Constitutional:  Positive for fatigue. Negative for appetite change.  ?Respiratory:  Positive for cough.   ?Gastrointestinal:  Positive for abdominal pain and nausea.  ?Endocrine: Positive for hot flashes.  ?Psychiatric/Behavioral:  Positive for sleep disturbance. The patient is nervous/anxious.   ?All other systems reviewed and are negative. ? ?I have reviewed the past medical history, past surgical history, social history and family history with the patient and they are unchanged from previous note. ? ? ?ALLERGIES:   ?is allergic to misc. sulfonamide containing compounds, sulfa antibiotics, cymbalta [duloxetine hcl], lyrica [pregabalin], and neurontin [gabapentin]. ? ? ?MEDICATIONS:  ?Current Outpatient Medications  ?Medication Sig Dispense Refill  ?  albuterol (VENTOLIN HFA) 108 (90 Base) MCG/ACT inhaler Inhale 2 puffs into the lungs every 6 (six) hours as needed for wheezing. 1 each 2  ? anastrozole (ARIMIDEX) 1 MG tablet Take 1 tablet (1 mg total) by mouth daily. 30 tablet 6  ? ergocalciferol (VITAMIN D2) 1.25 MG (50000 UT) capsule Take 1 capsule (50,000 Units total) by mouth once a week. 12 capsule 1  ? gabapentin (NEURONTIN) 300 MG capsule Take 1 capsule (300 mg total) by mouth 3 (three) times daily. 30 capsule 6  ? hydrocortisone 2.5 % cream Apply topically 2 (two) times daily. 30 g 0  ? ibuprofen (ADVIL) 800 MG tablet Take 1 tablet (800 mg total) by mouth every 8 (eight) hours as needed for moderate pain. 30 tablet 0  ? lisinopril-hydrochlorothiazide (ZESTORETIC) 20-12.5 MG tablet Take 1 tablet by mouth once daily 30 tablet 0  ? ondansetron (ZOFRAN) 4 MG tablet Take 1 tablet (4 mg total) by mouth every 8 (eight) hours as needed. 30 tablet 1  ? oxycodone (OXY-IR) 5 MG capsule Take 1 capsule (5 mg total) by mouth every 4 (four) hours as needed (severe pain). For AFTER surgery only, do not take and drive 5 capsule 0  ? senna-docusate (SENOKOT-S) 8.6-50 MG tablet Take 2 tablets by mouth at bedtime. For AFTER surgery, do not take if having diarrhea 30 tablet 0  ? Wound Dressings (SKINTEGRITY HYDROGEL) GEL Apply 1 application. topically 2 (two) times daily. 120 mL 3  ? ?No current facility-administered medications for this visit.  ? ? ? ?PHYSICAL EXAMINATION: ?Performance status (ECOG): 0 - Asymptomatic ? ?Vitals:  ? 04/09/22 1558  ?Pulse: 93  ?Resp: 18  ?  Temp: 98.3 ?F (36.8 ?C)  ?SpO2: 99%  ? ?Wt Readings from Last 3 Encounters:  ?04/09/22 246 lb 11.2 oz (111.9 kg)  ?03/19/22 244 lb 0.8 oz (110.7 kg)  ?03/18/22 244 lb (110.7 kg)  ? ?Physical Exam ?Vitals reviewed.  ?Constitutional:   ?   Appearance: Normal appearance. She is obese.  ?Cardiovascular:  ?   Rate and Rhythm: Normal rate and regular rhythm.  ?   Pulses: Normal pulses.  ?   Heart sounds: Normal heart  sounds.  ?Pulmonary:  ?   Effort: Pulmonary effort is normal.  ?   Breath sounds: Normal breath sounds.  ?Neurological:  ?   General: No focal deficit present.  ?   Mental Status: She is alert and oriented to person, place, and time.  ?Psychiatric:     ?   Mood and Affect: Mood normal.     ?   Behavior: Behavior normal.  ? ? ?Breast Exam Chaperone: Tammy Calderon   ? ? ?LABORATORY DATA:  ?I have reviewed the data as listed ? ?  Latest Ref Rng & Units 04/03/2022  ? 12:35 PM 03/20/2022  ?  4:48 AM 03/18/2022  ?  9:59 AM  ?CMP  ?Glucose 70 - 99 mg/dL 100   152   99    ?BUN 6 - 20 mg/dL $Remove'24   14   18    'aWWrYEj$ ?Creatinine 0.44 - 1.00 mg/dL 0.79   0.75   0.79    ?Sodium 135 - 145 mmol/L 140   134   136    ?Potassium 3.5 - 5.1 mmol/L 3.9   4.4   3.9    ?Chloride 98 - 111 mmol/L 106   100   102    ?CO2 22 - 32 mmol/L $RemoveB'27   26   26    'qhHktviT$ ?Calcium 8.9 - 10.3 mg/dL 9.5   9.2   9.2    ?Total Protein 6.5 - 8.1 g/dL 7.7    7.5    ?Total Bilirubin 0.3 - 1.2 mg/dL 0.7    0.3    ?Alkaline Phos 38 - 126 U/L 132    99    ?AST 15 - 41 U/L 20    28    ?ALT 0 - 44 U/L 19    30    ? ?No results found for: EKC003 ?Lab Results  ?Component Value Date  ? WBC 6.5 04/03/2022  ? HGB 11.7 (L) 04/03/2022  ? HCT 36.7 04/03/2022  ? MCV 90.4 04/03/2022  ? PLT 227 04/03/2022  ? NEUTROABS 3.9 04/03/2022  ? ? ?ASSESSMENT:  ?Left breast DCIS: ?- Abnormal screening mammogram on 09/19/2021. ?- Left breast biopsy 12:30 mass-ALH, 12:00 mass-ALH, 9:00 mass fibroadenoma ?- Left breast lumpectomy on 12/27/2021 ?- Pathology shows DCIS, grade 2, 0.15 cm, margins negative, atypical lobular hyperplasia.  ER shows weak expression and DCIS.  pTis, pNX ? ?  ?Social/family history: ?- Lives at home with her husband.  She works at [employer].  Non-smoker. ?- She is adopted.  Biological mother died of uterine cancer. ? ? ?PLAN:  ?Left breast DCIS: ?- She has completed radiation therapy to the left breast about a month ago. ?- She recently underwent hysterectomy.  She was found to have  stage Ia grade 1 endometrioid endometrial adenocarcinoma.  Does not require any adjuvant therapy. ?- At the time of surgery, she stopped taking anastrozole.  She reports hot flashes since anastrozole was started.  They are predominantly at nighttime. ?- Reviewed labs  today which showed mildly elevated alk phos.  CBC was grossly normal. ?- I have talked to her about the importance of taking antiestrogen therapy.  She finally agreed to restart back on anastrozole. ?- We will schedule her for diagnostic mammogram after 09/19/2022. ?- RTC 6 months for follow-up with repeat labs. ? ?2.  Vitamin D deficiency: ?- Vitamin D is 15. ?- We will start her on vitamin D 50,000 units weekly.  We will plan to check vitamin D level in 6 months. ?- Reviewed DEXA scan from 02/03/2022: T score -1, normal. ? ?3.  Hot flashes: ?- They are predominantly at nighttime and occurred during daytime also. ?- We will start her on gabapentin 300 mg at bedtime.  If it helps, she could try to take it twice daily. ? ?Breast Cancer therapy associated bone loss: I have recommended calcium, Vitamin D and weight bearing exercises. ? ?Orders placed this encounter:  ?No orders of the defined types were placed in this encounter. ? ? ?The patient has a good understanding of the overall plan. She agrees with it. She will call with any problems that may develop before the next visit here. ? ?Derek Jack, MD ?[place] ?[phone] ? ? ?I, Tammy Calderon, am acting as a scribe for Dr. Derek Jack. ? ?I, Derek Jack MD, have reviewed the above documentation for accuracy and completeness, and I agree with the above. ?  ? ? ?

## 2022-04-09 NOTE — Patient Instructions (Signed)
[facility] ?Discharge Instructions ? ?You were seen and examined today by Dr. Delton Coombes. ? ?Dr. Delton Coombes discussed your most recent lab work and everything looks okay except your hemoglobin is slightly low this is normal after recent surgery. Vitamin D is low Dr. Delton Coombes sent in Vitamin D 50000 units to be taken once a week. He sent in Gabapentin to be taken at bedtime to help with hot flashes. Continue taking Anastrozole as prescribed. ? ?Follow-up as scheduled in 6 months.  ? ? ?Thank you for choosing [facility] to provide your oncology and hematology care.  To afford each patient quality time with our provider, please arrive at least 15 minutes before your scheduled appointment time.  ? ?If you have a lab appointment with the [facility] please come in thru the Main Entrance and check in at the main information desk. ? ?You need to re-schedule your appointment should you arrive 10 or more minutes late.  We strive to give you quality time with our providers, and arriving late affects you and other patients whose appointments are after yours.  Also, if you no show three or more times for appointments you may be dismissed from the clinic at the providers discretion.     ?Again, thank you for choosing [facility].  Our hope is that these requests will decrease the amount of time that you wait before being seen by our physicians.       ?_____________________________________________________________ ? ?Should you have questions after your visit to [facility], please contact our office at [phone] and follow the prompts.  Our office hours are 8:00 a.m. and 4:30 p.m. Monday - Friday.  Please note that voicemails left after 4:00 p.m. may not be returned until the following business day.  We are closed weekends and major holidays.  You do have access to a nurse 24-7, just call the main number to the clinic  [phone] and do not press any options, hold on the line and a nurse will answer the phone.   ? ?For prescription refill requests, have your pharmacy contact our office and allow 72 hours.   ? ?Due to Covid, you will need to wear a mask upon entering the hospital. If you do not have a mask, a mask will be given to you at the Main Entrance upon arrival. For doctor visits, patients may have 1 support person age 65 or older with them. For treatment visits, patients can not have anyone with them due to social distancing guidelines and our immunocompromised population.  ? ?  ?

## 2022-04-10 ENCOUNTER — Other Ambulatory Visit (HOSPITAL_COMMUNITY): Payer: Self-pay | Admitting: Hematology

## 2022-04-10 ENCOUNTER — Encounter: Payer: Self-pay | Admitting: Gynecologic Oncology

## 2022-04-10 DIAGNOSIS — D0592 Unspecified type of carcinoma in situ of left breast: Secondary | ICD-10-CM

## 2022-04-11 ENCOUNTER — Encounter: Payer: Self-pay | Admitting: Gynecologic Oncology

## 2022-04-11 ENCOUNTER — Inpatient Hospital Stay: Payer: 59 | Admitting: Gynecologic Oncology

## 2022-04-11 DIAGNOSIS — C541 Malignant neoplasm of endometrium: Secondary | ICD-10-CM

## 2022-04-11 NOTE — Progress Notes (Unsigned)
Gynecologic Oncology Return Clinic Visit ? ?04/11/2022 ? ?Reason for Visit: Follow-up after recent surgery, treatment planning ? ?Treatment History: ?Oncology History Overview Note  ?MMR IHC intact ?  ?Endometrial cancer ([place])  ?03/07/2022 Initial Diagnosis  ? Endometrial cancer ([place]) ?  ? ?03/19/22: TRH/BSO, bilateral SLN biopsy for grade 1 endometrioid adenocarcinoma ?Pathology revealed stage Ia, FIGO grade 1, endometrioid adenocarcinoma, 18% myometrial invasion, no LVI, sentinel lymph nodes negative. ?MMR IHC intact, MS stable ? ?Interval History: ?*** ? ?Past Medical/Surgical History: ?Past Medical History:  ?Diagnosis Date  ? Anemia   ? past  ? Asthma   ? uses albuterol approx 1-2x a week  ? Bell's palsy   ? resolved  ? Breast cancer ([place])   ? Fibromyalgia   ? GERD (gastroesophageal reflux disease)   ? Hypertension   ? PONV (postoperative nausea and vomiting)   ? ? ?Past Surgical History:  ?Procedure Laterality Date  ? BREAST BIOPSY WITH RADIO FREQUENCY LOCALIZER Left 12/27/2021  ? Procedure: EXCISIONAL BREAST BIOPSY WITH RADIO FREQUENCY LOCALIZER X2;  Surgeon: Virl Cagey, MD;  Location: [facility];  Service: General;  Laterality: Left;  ? LYMPH NODE DISSECTION N/A 03/19/2022  ? Procedure: LYMPH NODE DISSECTION;  Surgeon: [name], MD;  Location: [facility];  Service: Gynecology;  Laterality: N/A;  ? NASAL SEPTUM SURGERY    ? ROBOTIC ASSISTED TOTAL HYSTERECTOMY WITH BILATERAL SALPINGO OOPHERECTOMY Bilateral 03/19/2022  ? Procedure: XI ROBOTIC ASSISTED TOTAL HYSTERECTOMY WITH BILATERAL SALPINGO OOPHORECTOMY;  Surgeon: [name], MD;  Location: [facility];  Service: Gynecology;  Laterality: Bilateral;  ? SENTINEL NODE BIOPSY N/A 03/19/2022  ? Procedure: SENTINEL NODE BIOPSY;  Surgeon: [name], MD;  Location: [facility];  Service: Gynecology;  Laterality: N/A;  ? TUBAL LIGATION    ? WISDOM TOOTH EXTRACTION    ? ? ?Family History  ?Adopted: Yes  ?Problem Relation Age of Onset  ? Uterine cancer  Mother   ? ? ?Social History  ? ?Socioeconomic History  ? Marital status: Married  ?  Spouse name: Not on file  ? Number of children: Not on file  ? Years of education: Not on file  ? Highest education level: Not on file  ?Occupational History  ? Not on file  ?Tobacco Use  ? Smoking status: Never  ? Smokeless tobacco: Never  ?Vaping Use  ? Vaping Use: Never used  ?Substance and Sexual Activity  ? Alcohol use: No  ? Drug use: Never  ? Sexual activity: Yes  ?  Birth control/protection: Post-menopausal  ?Other Topics Concern  ? Not on file  ?Social History Narrative  ? Not on file  ? ?Social Determinants of Health  ? ?Financial Resource Strain: Not on file  ?Food Insecurity: Not on file  ?Transportation Needs: Not on file  ?Physical Activity: Not on file  ?Stress: Not on file  ?Social Connections: Not on file  ? ? ?Current Medications: ? ?Current Outpatient Medications:  ?  albuterol (VENTOLIN HFA) 108 (90 Base) MCG/ACT inhaler, Inhale 2 puffs into the lungs every 6 (six) hours as needed for wheezing., Disp: 1 each, Rfl: 2 ?  anastrozole (ARIMIDEX) 1 MG tablet, Take 1 tablet (1 mg total) by mouth daily., Disp: 30 tablet, Rfl: 6 ?  ergocalciferol (VITAMIN D2) 1.25 MG (50000 UT) capsule, Take 1 capsule (50,000 Units total) by mouth once a week., Disp: 12 capsule, Rfl: 1 ?  gabapentin (NEURONTIN) 300 MG capsule, Take 1 capsule (300 mg total) by mouth 3 (three)  times daily., Disp: 30 capsule, Rfl: 6 ?  ibuprofen (ADVIL) 800 MG tablet, Take 1 tablet (800 mg total) by mouth every 8 (eight) hours as needed for moderate pain., Disp: 30 tablet, Rfl: 0 ?  lisinopril-hydrochlorothiazide (ZESTORETIC) 20-12.5 MG tablet, Take 1 tablet by mouth once daily, Disp: 30 tablet, Rfl: 0 ?  ondansetron (ZOFRAN) 4 MG tablet, Take 1 tablet (4 mg total) by mouth every 8 (eight) hours as needed., Disp: 30 tablet, Rfl: 1 ?  senna-docusate (SENOKOT-S) 8.6-50 MG tablet, Take 2 tablets by mouth at bedtime. For AFTER surgery, do not take if having  diarrhea, Disp: 30 tablet, Rfl: 0 ? ?Review of Systems: ?Denies appetite changes, fevers, chills, fatigue, unexplained weight changes. ?Denies hearing loss, neck lumps or masses, mouth sores, ringing in ears or voice changes. ?Denies cough or wheezing.  Denies shortness of breath. ?Denies chest pain or palpitations. Denies leg swelling. ?Denies abdominal distention, pain, blood in stools, constipation, diarrhea, nausea, vomiting, or early satiety. ?Denies pain with intercourse, dysuria, frequency, hematuria or incontinence. ?Denies hot flashes, pelvic pain, vaginal bleeding or vaginal discharge.   ?Denies joint pain, back pain or muscle pain/cramps. ?Denies itching, rash, or wounds. ?Denies dizziness, headaches, numbness or seizures. ?Denies swollen lymph nodes or glands, denies easy bruising or bleeding. ?Denies anxiety, depression, confusion, or decreased concentration. ? ?Physical Exam: ?LMP 10/06/2011  ?General: Alert, oriented, no acute distress. ?HEENT: Normocephalic, atraumatic, sclera anicteric. ?Chest: Clear to auscultation bilaterally.  No wheezes or rhonchi.  Erythema of the left breast and axillary region, mild blanching. ?Cardiovascular: Regular rate and rhythm, no murmurs. ?Abdomen: Obese, soft, nontender.  Normoactive bowel sounds.  No masses or hepatosplenomegaly appreciated.   ?Extremities: Grossly normal range of motion.  Warm, well perfused.  No edema bilaterally. ?Skin: No rashes or lesions noted. ?Lymphatics: No cervical, supraclavicular, or inguinal adenopathy. ?GU: Normal appearing external genitalia without erythema, excoriation, or lesions.  Speculum exam reveals ***.  Bimanual exam reveals ***.  ***Rectovaginal exam  ?confirms ___. ? ?Laboratory & Radiologic Studies: ?A. SENTINEL LYMPH NODE, RIGHT OBTURATOR, BIOPSY:  ?One benign lymph node, negative for carcinoma by IHC (0/1)  ? ?B. SENTINEL LYMPH NODE, LEFT OBTURATOR, AND ADJACENT LYMPH NODE, BIOPSY:  ?Five benign lymph nodes, negative for  carcinoma biopsy (0/5)  ? ?C. UTERUS, CERVIX, BILATERAL TUBES AND OVARIES:  ?Invasive well differentiated endometrioid adenocarcinoma with focal  ?squamous morular metaplasia, FIGO 1  ?Tumor invades less than 50% of myometrium (3 mm of 16 mm, 18%)  ?Tumor involves the entire endometrium and is multifocally superficially  ?invasive  ?Background atypical hyperplasia  ?Chronic cervicitis with squamous metaplasia  ?Benign fallopian tubes and ovaries  ? ?Assessment & Plan: ?Tammy Calderon is a 56 y.o. woman with Stage IA low risk endometrioid endometrial adenocarcinoma who presents today for follow-up after recent surgery, treatment discussion. ? ?*** ? ?Patient is overall doing well and meeting postoperative milestones.  Discussed continued expectations as well as postoperative restrictions. ? ?Reviewed final pathology report with her.  Patient was given a copy of this.  We discussed that she has no high risk features and thus no adjuvant treatment is indicated.  While her risk of recurrence is low, NCCN surveillance recommendations are for visits every 6 months for 5 years and then yearly thereafter.  I recommend that we alternate visits between my office and her OB/GYN. ? ?*** minutes of total time was spent for this patient encounter, including preparation, face-to-face counseling with the patient and coordination of care, and documentation of  the encounter. ? ?Jeral Pinch, MD  ?Division of Gynecologic Oncology  ?Department of Obstetrics and Gynecology  ?[facility]  ? ?

## 2022-04-11 NOTE — Patient Instructions (Signed)
It was good to see you today.  You are healing well from surgery. ? ?As we discussed today, you do not need any additional treatment after surgery.  I commend follow-up every 6 months for the next 5 years.  We will alternate this between my office and your OB/GYN. ? ?If you develop any of the symptoms that we discussed today, including vaginal bleeding or discharge, pelvic pain, change to bowel function, or unintentional weight loss, please call to see me sooner. ?

## 2022-04-15 ENCOUNTER — Other Ambulatory Visit: Payer: Self-pay

## 2022-04-15 ENCOUNTER — Encounter: Payer: Self-pay | Admitting: Gynecologic Oncology

## 2022-04-15 ENCOUNTER — Inpatient Hospital Stay (HOSPITAL_BASED_OUTPATIENT_CLINIC_OR_DEPARTMENT_OTHER): Payer: 59 | Admitting: Gynecologic Oncology

## 2022-04-15 VITALS — BP 138/78 | HR 74 | Temp 97.6°F | Resp 20 | Wt 238.0 lb

## 2022-04-15 DIAGNOSIS — D0512 Intraductal carcinoma in situ of left breast: Secondary | ICD-10-CM

## 2022-04-15 DIAGNOSIS — C541 Malignant neoplasm of endometrium: Secondary | ICD-10-CM

## 2022-04-15 DIAGNOSIS — Z7189 Other specified counseling: Secondary | ICD-10-CM

## 2022-04-15 DIAGNOSIS — R232 Flushing: Secondary | ICD-10-CM

## 2022-04-15 DIAGNOSIS — Z90710 Acquired absence of both cervix and uterus: Secondary | ICD-10-CM

## 2022-04-15 DIAGNOSIS — Z90722 Acquired absence of ovaries, bilateral: Secondary | ICD-10-CM

## 2022-04-15 MED ORDER — GABAPENTIN 100 MG PO CAPS: 100.0000 mg | ORAL_CAPSULE | Freq: Every day | ORAL | 3 refills | Status: DC

## 2022-04-15 NOTE — Patient Instructions (Addendum)
You are healing well from surgery.  It was good to see you today.  Remember, no heavy lifting for at least 6 weeks and nothing in the vagina for 8. ? ?We will plan on visits every 6 months.  I would like to alternate these between my office and your OB/GYN.  As we discussed today, we will plan to have your first visit here with me in October.  Please call sometime after August to get this scheduled. ? ?If you have any concerning symptoms, as the ones we reviewed today, before your next visit, please call to see me sooner. ? ?Sending a new prescription in for gabapentin given your fall after starting the 300 mg tablet.  Please let me know if you have any issues with the 100 mg dose. ?

## 2022-04-15 NOTE — Progress Notes (Signed)
Gynecologic Oncology Return Clinic Visit ? ?04/15/2022 ? ?Reason for Visit: Follow-up after surgery, treatment discussion ? ?Treatment History: ?Oncology History Overview Note  ?MMR IHC intact ?MS stable ?  ?Endometrial cancer ([place])  ?02/17/2022 Initial Biopsy  ? EMB: Endometrioid carcinoma, FIGO grade 1.  Prolapsing endocervical mass-benign endocervical polyp. ?  ?02/28/2022 Imaging  ? Pelvic ultrasound shows abnormally thickened heterogenous endometrial complex measuring 18 mm, simple right ovarian cyst. ?  ?03/07/2022 Initial Diagnosis  ? Endometrial cancer [facility]) ?  ?03/19/2022 Surgery  ? TRH/BSO, bilateral sentinel lymph node biopsy ? ?Findings: On EUA, small mobile uterus, no adnexal masses. On intra-abdominal entry, normal upper abdominal survey.  Normal-appearing omentum, small and large bowel.  Filmy adhesions of the appendix to the sigmoid epiploica and right pelvic sidewall.  Clips noted on the fallopian tube consistent with prior tubal ligation.  Normal-appearing bilateral adnexa.  Uterus 8 cm and somewhat bulbous at the fundus, otherwise normal in appearance.  Mapping successful bilaterally to obturator sentinel lymph nodes.  Mildly enlarged lymph node on the left with enlarged adjacent lymph node that was also removed.  No obvious extrauterine tumor.  Mild adhesions between the bladder and lower uterine segment/cervix. ?  ?03/19/2022 Pathology Results  ? Stage IA, grade 1 endometrioid adenocarcinoma ?MI 18% ?No LVI ?Negative SLNs (and adjacent LNs) ?  ? ? ?Interval History: ?Patient reports overall doing well from a recovery standpoint.  She has occasional menstrual-like cramping that she describes as being similar to ovarian cramping.  Her bowel function has improved, she uses Senokot as needed if she feels like she is getting constipated.  She denies any bladder symptoms.  Has occasional nausea which seems related to sinus drainage that she has had since surgery.  Denies any fevers or chills. ? ?Denies any  vaginal bleeding or discharge. ? ?Reports that she fell after getting up too fast and feeling dizzy.  This was the day after starting gabapentin.  Most of the impact was to her right shoulder. ? ?Past Medical/Surgical History: ?Past Medical History:  ?Diagnosis Date  ? Anemia   ? past  ? Asthma   ? uses albuterol approx 1-2x a week  ? Bell's palsy   ? resolved  ? Breast cancer ([place])   ? Fibromyalgia   ? GERD (gastroesophageal reflux disease)   ? Hypertension   ? PONV (postoperative nausea and vomiting)   ? ? ?Past Surgical History:  ?Procedure Laterality Date  ? BREAST BIOPSY WITH RADIO FREQUENCY LOCALIZER Left 12/27/2021  ? Procedure: EXCISIONAL BREAST BIOPSY WITH RADIO FREQUENCY LOCALIZER X2;  Surgeon: Virl Cagey, MD;  Location: [facility];  Service: General;  Laterality: Left;  ? LYMPH NODE DISSECTION N/A 03/19/2022  ? Procedure: LYMPH NODE DISSECTION;  Surgeon: [name], MD;  Location: [facility];  Service: Gynecology;  Laterality: N/A;  ? NASAL SEPTUM SURGERY    ? ROBOTIC ASSISTED TOTAL HYSTERECTOMY WITH BILATERAL SALPINGO OOPHERECTOMY Bilateral 03/19/2022  ? Procedure: XI ROBOTIC ASSISTED TOTAL HYSTERECTOMY WITH BILATERAL SALPINGO OOPHORECTOMY;  Surgeon: [name], MD;  Location: [facility];  Service: Gynecology;  Laterality: Bilateral;  ? SENTINEL NODE BIOPSY N/A 03/19/2022  ? Procedure: SENTINEL NODE BIOPSY;  Surgeon: [name], MD;  Location: [facility];  Service: Gynecology;  Laterality: N/A;  ? TUBAL LIGATION    ? WISDOM TOOTH EXTRACTION    ? ? ?Family History  ?Adopted: Yes  ?Problem Relation Age of Onset  ? Uterine cancer Mother   ? ? ?Social History  ? ?  Socioeconomic History  ? Marital status: Married  ?  Spouse name: Not on file  ? Number of children: Not on file  ? Years of education: Not on file  ? Highest education level: Not on file  ?Occupational History  ? Not on file  ?Tobacco Use  ? Smoking status: Never  ? Smokeless tobacco: Never  ?Vaping Use  ? Vaping Use: Never used   ?Substance and Sexual Activity  ? Alcohol use: No  ? Drug use: Never  ? Sexual activity: Yes  ?  Birth control/protection: Post-menopausal  ?Other Topics Concern  ? Not on file  ?Social History Narrative  ? Not on file  ? ?Social Determinants of Health  ? ?Financial Resource Strain: Not on file  ?Food Insecurity: Not on file  ?Transportation Needs: Not on file  ?Physical Activity: Not on file  ?Stress: Not on file  ?Social Connections: Not on file  ? ? ?Current Medications: ? ?Current Outpatient Medications:  ?  albuterol (VENTOLIN HFA) 108 (90 Base) MCG/ACT inhaler, Inhale 2 puffs into the lungs every 6 (six) hours as needed for wheezing., Disp: 1 each, Rfl: 2 ?  anastrozole (ARIMIDEX) 1 MG tablet, Take 1 tablet (1 mg total) by mouth daily., Disp: 30 tablet, Rfl: 6 ?  ergocalciferol (VITAMIN D2) 1.25 MG (50000 UT) capsule, Take 1 capsule (50,000 Units total) by mouth once a week., Disp: 12 capsule, Rfl: 1 ?  gabapentin (NEURONTIN) 100 MG capsule, Take 1 capsule (100 mg total) by mouth at bedtime., Disp: 30 capsule, Rfl: 3 ?  ibuprofen (ADVIL) 800 MG tablet, Take 1 tablet (800 mg total) by mouth every 8 (eight) hours as needed for moderate pain., Disp: 30 tablet, Rfl: 0 ?  lisinopril-hydrochlorothiazide (ZESTORETIC) 20-12.5 MG tablet, Take 1 tablet by mouth once daily, Disp: 30 tablet, Rfl: 0 ?  ondansetron (ZOFRAN) 4 MG tablet, Take 1 tablet (4 mg total) by mouth every 8 (eight) hours as needed., Disp: 30 tablet, Rfl: 1 ?  senna-docusate (SENOKOT-S) 8.6-50 MG tablet, Take 2 tablets by mouth at bedtime. For AFTER surgery, do not take if having diarrhea, Disp: 30 tablet, Rfl: 0 ? ?Review of Systems: ?Denies appetite changes, fevers, chills, fatigue, unexplained weight changes. ?Denies hearing loss, neck lumps or masses, mouth sores, ringing in ears or voice changes. ?Denies cough or wheezing.  Denies shortness of breath. ?Denies chest pain or palpitations. Denies leg swelling. ?Denies abdominal distention, blood in  stools, diarrhea, nausea, vomiting, or early satiety. ?Denies pain with intercourse, dysuria, frequency, hematuria or incontinence. ?Denies vaginal bleeding or vaginal discharge.   ?Denies joint pain, back pain or muscle pain/cramps. ?Denies itching, rash, or wounds. ?Denies dizziness, headaches, numbness or seizures. ?Denies swollen lymph nodes or glands, denies easy bruising or bleeding. ?Denies anxiety, depression, confusion, or decreased concentration. ? ?Physical Exam: ?BP 138/78 (BP Location: Right Arm, Patient Position: Sitting)   Pulse 74   Temp 97.6 ?F (36.4 ?C) (Oral)   Resp 20   Wt 238 lb (108 kg)   LMP 10/06/2011   BMI 44.97 kg/m?  ?General: Alert, oriented, no acute distress. ?HEENT: Normocephalic, atraumatic, sclera anicteric. ?Chest: Unlabored breathing on room air. ?Abdomen: Obese, soft, nontender.  Normoactive bowel sounds.  No masses or hepatosplenomegaly appreciated.  Well-healed incisions, remaining mattress sutures removed from right lateral incision. ?Extremities: Grossly normal range of motion.  Warm, well perfused.  Trace edema bilaterally. ?Skin: No rashes or lesions noted. ?GU: Normal appearing external genitalia without erythema, excoriation, or lesions.  Speculum exam reveals  no bleeding or discharge, cuff intact and suture still visible.  Bimanual exam reveals cuff intact, no fluctuance or tenderness with palpation.   ? ?Laboratory & Radiologic Studies: ?None new ? ?Assessment & Plan: ?Tammy Calderon is a 56 y.o. woman with Stage 1A low risk endometrial adenocarcinoma who presents for follow-up after surgery, treatment discussion. ?IHC MMR intact, MS stable ? ?Patient is overall doing well from a postoperative standpoint.  Discussed continued expectations as well as postoperative restrictions. ? ?I am concerned about her fall after starting gabapentin.  She was started on a dose of 300 mg.  She has taken it in the past but when she did, took a dose of 100 mg.  I offered to send a new  prescription in for her.  I would recommend to 100 mg and just at night. ? ?We reviewed her pathology again in detail.  Patient was given a copy of her final pathology report.  Discussed that given early stag

## 2022-04-20 ENCOUNTER — Emergency Department (HOSPITAL_COMMUNITY)
Admission: EM | Admit: 2022-04-20 | Discharge: 2022-04-20 | Disposition: A | Discharge status: Home / Self Care | Payer: 59 | Attending: Emergency Medicine | Admitting: Emergency Medicine

## 2022-04-20 ENCOUNTER — Other Ambulatory Visit: Payer: Self-pay

## 2022-04-20 ENCOUNTER — Encounter (HOSPITAL_COMMUNITY): Payer: Self-pay | Admitting: *Deleted

## 2022-04-20 ENCOUNTER — Emergency Department (HOSPITAL_COMMUNITY): Payer: 59

## 2022-04-20 DIAGNOSIS — M79661 Pain in right lower leg: Secondary | ICD-10-CM | POA: Diagnosis present

## 2022-04-20 DIAGNOSIS — Z79899 Other long term (current) drug therapy: Secondary | ICD-10-CM | POA: Insufficient documentation

## 2022-04-20 DIAGNOSIS — J45909 Unspecified asthma, uncomplicated: Secondary | ICD-10-CM | POA: Diagnosis not present

## 2022-04-20 DIAGNOSIS — Z853 Personal history of malignant neoplasm of breast: Secondary | ICD-10-CM | POA: Diagnosis not present

## 2022-04-20 DIAGNOSIS — S89201A Unspecified physeal fracture of upper end of right fibula, initial encounter for closed fracture: Secondary | ICD-10-CM | POA: Insufficient documentation

## 2022-04-20 DIAGNOSIS — I10 Essential (primary) hypertension: Secondary | ICD-10-CM | POA: Insufficient documentation

## 2022-04-20 DIAGNOSIS — W19XXXA Unspecified fall, initial encounter: Secondary | ICD-10-CM

## 2022-04-20 DIAGNOSIS — W109XXA Fall (on) (from) unspecified stairs and steps, initial encounter: Secondary | ICD-10-CM | POA: Diagnosis not present

## 2022-04-20 DIAGNOSIS — S82831A Other fracture of upper and lower end of right fibula, initial encounter for closed fracture: Secondary | ICD-10-CM

## 2022-04-20 MED ORDER — KETOROLAC TROMETHAMINE 15 MG/ML IJ SOLN
15.0000 mg | Freq: Once | INTRAMUSCULAR | Status: AC
  Administered 2022-04-20: 15 mg via INTRAMUSCULAR
  Filled 2022-04-20: qty 1

## 2022-04-20 MED ORDER — HYDROCODONE-ACETAMINOPHEN 5-325 MG PO TABS: 1.0000 | ORAL_TABLET | Freq: Four times a day (QID) | ORAL | 0 refills | Status: DC | PRN

## 2022-04-20 NOTE — ED Notes (Signed)
Knee immobilizer applied to R knee/leg. Crutches given. Pt performed a return demonstration and was able to ambulate several feet with crutches and knee immobilizer without issues  ?

## 2022-04-20 NOTE — ED Notes (Signed)
Circulation, CMS, intact after knee immobilization application  ?

## 2022-04-20 NOTE — Discharge Instructions (Signed)
Attention pain medication to the pharmacy.  Please fill and take as prescribed.  I would like for you to follow-up with orthopedics for further evaluation.  Please return to the emergency part for any worsening symptoms. ?

## 2022-04-20 NOTE — ED Triage Notes (Signed)
Pt slipped on some wet steps this morning landing on her left side/shoulder.   C/o right leg pain.  Unsure if she hit her head.  Denies taking any blood thinners.  ?

## 2022-04-20 NOTE — ED Provider Notes (Signed)
?[facility] ?Provider Note ? ? ?CSN: [id number] ?Arrival date & time: 04/20/22  1300 ? ?  ? ?History ? ?Chief Complaint  ?Patient presents with  ? Fall  ? ? ?Tammy Calderon is a 56 y.o. female with history of rotator cuff injury in the right shoulder and lumpectomy in the left breast earlier this year who presents to the emergency department after a mechanical trip and fall that occurred earlier this morning.  Patient was walking down the stairs when she slipped on a wet step and fell down approximately 3-4 steps.  She did not hit her head or lose consciousness.  She fell onto her right side and rolled over onto the left side.  She complains of pain in the right lower extremity from the knee down.  Also complaining of right shoulder pain.  Did not take any medications prior to arrival. ? ? ?Fall ? ? ?  ? ?Home Medications ?Prior to Admission medications   ?Medication Sig Start Date End Date Taking? Authorizing Provider  ?HYDROcodone-acetaminophen (NORCO/VICODIN) 5-325 MG tablet Take 1-2 tablets by mouth every 6 (six) hours as needed. 04/20/22  Yes Raul Del, Anela Bensman M, PA-C  ?albuterol (VENTOLIN HFA) 108 (90 Base) MCG/ACT inhaler Inhale 2 puffs into the lungs every 6 (six) hours as needed for wheezing. 07/24/21   Vanessa Kick, MD  ?anastrozole (ARIMIDEX) 1 MG tablet Take 1 tablet (1 mg total) by mouth daily. 04/09/22   Derek Jack, MD  ?ergocalciferol (VITAMIN D2) 1.25 MG (50000 UT) capsule Take 1 capsule (50,000 Units total) by mouth once a week. 04/09/22   Derek Jack, MD  ?gabapentin (NEURONTIN) 100 MG capsule Take 1 capsule (100 mg total) by mouth at bedtime. 04/15/22   [name], MD  ?ibuprofen (ADVIL) 800 MG tablet Take 1 tablet (800 mg total) by mouth every 8 (eight) hours as needed for moderate pain. 03/20/22   Joylene John D, NP  ?lisinopril-hydrochlorothiazide (ZESTORETIC) 20-12.5 MG tablet Take 1 tablet by mouth once daily 04/03/22   Kathyrn Drown, MD  ?ondansetron  (ZOFRAN) 4 MG tablet Take 1 tablet (4 mg total) by mouth every 8 (eight) hours as needed. 12/27/21 12/27/22  Virl Cagey, MD  ?senna-docusate (SENOKOT-S) 8.6-50 MG tablet Take 2 tablets by mouth at bedtime. For AFTER surgery, do not take if having diarrhea 03/07/22   Joylene John D, NP  ?   ? ?Allergies    ?Misc. sulfonamide containing compounds, Sulfa antibiotics, Cymbalta [duloxetine hcl], Lyrica [pregabalin], and Neurontin [gabapentin]   ? ?Review of Systems   ?Review of Systems  ?All other systems reviewed and are negative. ? ?Physical Exam ?Updated Vital Signs ?BP 136/67 (BP Location: Left Arm)   Pulse 72   Temp (!) 96.5 ?F (35.8 ?C) (Temporal)   Resp 16   Ht '5\' 1"'$  (1.549 m)   Wt 111.1 kg   LMP 10/06/2011   SpO2 100%   BMI 46.29 kg/m?  ?Physical Exam ?Vitals and nursing note reviewed.  ?Constitutional:   ?   Appearance: Normal appearance. She is obese.  ?HENT:  ?   Head: Normocephalic and atraumatic.  ?Eyes:  ?   General:     ?   Right eye: No discharge.     ?   Left eye: No discharge.  ?   Conjunctiva/sclera: Conjunctivae normal.  ?Pulmonary:  ?   Effort: Pulmonary effort is normal.  ?Musculoskeletal:  ?   Comments: Right knee is tender to palpation.  No significant swelling of  the right knee.  There is diffuse tenderness to the right tibia and fibula, right ankle, and right foot.  2+ dorsalis pedis pulse felt on the right.  Normal sensation distally.  ?Skin: ?   General: Skin is warm and dry.  ?   Findings: No rash.  ?Neurological:  ?   General: No focal deficit present.  ?   Mental Status: She is alert.  ?Psychiatric:     ?   Mood and Affect: Mood normal.     ?   Behavior: Behavior normal.  ? ? ?ED Results / Procedures / Treatments   ?Labs ?(all labs ordered are listed, but only abnormal results are displayed) ?Labs Reviewed - No data to display ? ?EKG ?None ? ?Radiology ?DG Shoulder Right ? ?Result Date: 04/20/2022 ?CLINICAL DATA:  Fall, shoulder pain EXAM: RIGHT SHOULDER - 2+ VIEW COMPARISON:   02/06/2022 FINDINGS: Alignment is anatomic. No acute fracture. Joint spaces are preserved. IMPRESSION: No acute fracture. Electronically Signed   By: Macy Mis M.D.   On: 04/20/2022 15:12  ? ?DG Tibia/Fibula Right ? ?Result Date: 04/20/2022 ?CLINICAL DATA:  Trauma, fall EXAM: RIGHT TIBIA AND FIBULA - 2 VIEW COMPARISON:  None. FINDINGS: There is comminuted oblique fracture in the proximal shaft of fibula. There is 2-3 mm offset in the alignment of fracture fragments. Rest of the visualized bony structures appear intact. IMPRESSION: Comminuted, minimally displaced fracture is seen in the proximal shaft of right fibula. Electronically Signed   By: Elmer Picker M.D.   On: 04/20/2022 15:13  ? ?DG Foot Complete Right ? ?Result Date: 04/20/2022 ?CLINICAL DATA:  Trauma, pain EXAM: RIGHT FOOT COMPLETE - 3+ VIEW COMPARISON:  None. FINDINGS: There is no evidence of fracture or dislocation. There is no evidence of arthropathy or other focal bone abnormality. Soft tissues are unremarkable. IMPRESSION: No fracture or dislocation is seen in the right foot. Electronically Signed   By: Elmer Picker M.D.   On: 04/20/2022 15:14   ? ?Procedures ?Procedures  ? ? ?Medications Ordered in ED ?Medications  ?ketorolac (TORADOL) 15 MG/ML injection 15 mg (15 mg Intramuscular Given 04/20/22 1416)  ? ? ?ED Course/ Medical Decision Making/ A&P ?  ?                        ?Medical Decision Making ?Amount and/or Complexity of Data Reviewed ?Radiology: ordered. ? ?Risk ?Prescription drug management. ? ? ?This patient presents to the ED for concern of mechanical fall, this involves an extensive number of treatment options, and is a complaint that carries with it a high risk of complications and morbidity.  The differential diagnosis includes muscular strain, soft tissue injury, fracture over the tibia or fibula. ? ? ?Co morbidities that complicate the patient evaluation ? ?Past Medical History:  ?Diagnosis Date  ? Anemia   ? past  ?  Asthma   ? uses albuterol approx 1-2x a week  ? Bell's palsy   ? resolved  ? Breast cancer ([place])   ? Fibromyalgia   ? GERD (gastroesophageal reflux disease)   ? Hypertension   ? PONV (postoperative nausea and vomiting)   ? ? ?Additional history obtained: ? ?Additional history obtained from nursing note ?External records from outside source obtained and reviewed including discharge summary where patient underwent surgery secondary to endometrial cancer. ? ? ?Lab Tests: ? ?I Ordered, and personally interpreted labs.  The pertinent results include: None ? ? ?Imaging Studies ordered: ? ?I ordered imaging  studies including x-rays of the right knee, tibia/fibula, right ankle, and right foot ?I independently visualized and interpreted imaging which showed proximal minimally displaced fibular fracture on the right. ?I agree with the radiologist interpretation ? ? ?Cardiac Monitoring: ? ?The patient was maintained on a cardiac monitor.  I personally viewed and interpreted the cardiac monitored which showed an underlying rhythm of: Normal sinus rhythm ? ? ?Medicines ordered and prescription drug management: ? ?I ordered medication including Toradol for pain ?Reevaluation of the patient after these medicines showed that the patient improved ?I have reviewed the patients home medicines and have made adjustments as needed ? ? ?Test Considered: ? ?N/A ? ? ?Critical Interventions: ? ?Pain control ?Fracture mobilization ? ? ?Problem List / ED Course: ? ?Patient presents the emergency department today after a mechanical slip and fall down some stairs.  I have a low suspicion for any pathology in the head right now she did not hit her head or lose consciousness.  Patient does have a proximal fibular fracture with no evidence of ankle fracture distally.  Will place patient in knee immobilizer and try with crutches to see if she can ambulate.  We will send home with pain medications and have her follow-up with orthopedics.  Patient  successfully ambulated with crutches without bearing any weight on the right leg. ? ? ?Reevaluation: ? ?After the interventions noted above, I reevaluated the patient and found that they have :improved ? ? ?Social

## 2022-04-22 NOTE — Progress Notes (Signed)
? ?                                                                                                                                                          ?  Patient Name: Tammy Calderon ?MRN: [id number] ?DOB: 01-26-1966 ?Referring Physician: Derek Jack ?Date of Service: 03/03/2022 ? [facility] [place] ? ?                                                      End Of Treatment Note ? 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 ?Intent: Curative ? ?Radiation Treatment Dates: 02/05/2022 through 03/03/2022 ?Site Technique Total Dose (Gy) Dose per Fx (Gy) Completed Fx Beam Energies  ?Breast, Left: Breast_L 3D 42.56/42.56 2.66 16/16 10X  ? ?Narrative: The patient tolerated radiation therapy relatively well.  ? ?Plan: The patient will follow-up with radiation oncology in 84mo . ? ?----------------------------------- ? ?Eppie Gibson, MD ? ?

## 2022-04-23 ENCOUNTER — Other Ambulatory Visit (HOSPITAL_BASED_OUTPATIENT_CLINIC_OR_DEPARTMENT_OTHER): Payer: Self-pay

## 2022-04-23 ENCOUNTER — Telehealth: Payer: Self-pay | Admitting: Orthopaedic Surgery

## 2022-04-23 ENCOUNTER — Ambulatory Visit (INDEPENDENT_AMBULATORY_CARE_PROVIDER_SITE_OTHER): Payer: 59 | Admitting: Orthopaedic Surgery

## 2022-04-23 ENCOUNTER — Ambulatory Visit (HOSPITAL_BASED_OUTPATIENT_CLINIC_OR_DEPARTMENT_OTHER): Payer: 59 | Admitting: Orthopaedic Surgery

## 2022-04-23 ENCOUNTER — Ambulatory Visit (INDEPENDENT_AMBULATORY_CARE_PROVIDER_SITE_OTHER): Payer: 59

## 2022-04-23 ENCOUNTER — Other Ambulatory Visit (HOSPITAL_BASED_OUTPATIENT_CLINIC_OR_DEPARTMENT_OTHER): Payer: Self-pay | Admitting: Orthopaedic Surgery

## 2022-04-23 DIAGNOSIS — M7581 Other shoulder lesions, right shoulder: Secondary | ICD-10-CM

## 2022-04-23 DIAGNOSIS — M25571 Pain in right ankle and joints of right foot: Secondary | ICD-10-CM

## 2022-04-23 MED ORDER — MELOXICAM 15 MG PO TABS
15.0000 mg | ORAL_TABLET | Freq: Every day | ORAL | 1 refills | Status: DC
  Filled 2022-04-23: qty 30, 30d supply, fill #0

## 2022-04-23 MED ORDER — TRAMADOL HCL 50 MG PO TABS
50.0000 mg | ORAL_TABLET | Freq: Four times a day (QID) | ORAL | 0 refills | Status: DC | PRN
  Filled 2022-04-23: qty 30, 14d supply, fill #0

## 2022-04-23 NOTE — Telephone Encounter (Signed)
Pt broken her tibia and is wondering if she can be worked in before 05/15? ? ?CB [phone] ?

## 2022-04-23 NOTE — Progress Notes (Signed)
? ?                            ? ? ?Chief Complaint: Right shoulder pain ?  ? ? ?History of Present Illness:  ? ? ?Tammy Calderon is a 56 y.o. female ambidextrous female presents with right lower leg pain as well as right shoulder pain after a fall on her way to church.  She landed directly on the right side.  She states that she has not previously have any issues with the right leg.  Since that time she has been having difficulty placing weight directly on the right leg.  She was brought to the emergency room and found to have a proximal third fibular shaft fracture.  She was given a knee immobilizer and made nonweightbearing on crutches.  She has had a very difficult time with crutches as result of her right shoulder.  She has previously been seen by an orthopedic surgeon for the right shoulder who has been treating her rotator cuff with an injection.  She did not get any relief from the injection.  Unfortunately since the fall she now has extremely limited range of motion.  She is not able to forward elevate her arm since the injury.  She is very concerned about this as she has been very dependent on her arms as a result of the injury to the lower leg.  She has been using crutches which is quite difficult.  Her husband is also helping her significantly to mobilize.  She has recently unfortunately been treated for both breast cancer and ovarian cancer requiring surgery. ? ? ? ?Surgical History:   ?None ? ?PMH/PSH/Family History/Social History/Meds/Allergies:   ? ?Past Medical History:  ?Diagnosis Date  ? Anemia   ? past  ? Asthma   ? uses albuterol approx 1-2x a week  ? Bell's palsy   ? resolved  ? Breast cancer ([place])   ? Fibromyalgia   ? GERD (gastroesophageal reflux disease)   ? Hypertension   ? PONV (postoperative nausea and vomiting)   ? ?Past Surgical History:  ?Procedure Laterality Date  ? BREAST BIOPSY WITH RADIO FREQUENCY LOCALIZER Left 12/27/2021  ? Procedure: EXCISIONAL BREAST BIOPSY WITH RADIO FREQUENCY  LOCALIZER X2;  Surgeon: Virl Cagey, MD;  Location: [facility];  Service: General;  Laterality: Left;  ? LYMPH NODE DISSECTION N/A 03/19/2022  ? Procedure: LYMPH NODE DISSECTION;  Surgeon: [name], MD;  Location: [facility];  Service: Gynecology;  Laterality: N/A;  ? NASAL SEPTUM SURGERY    ? ROBOTIC ASSISTED TOTAL HYSTERECTOMY WITH BILATERAL SALPINGO OOPHERECTOMY Bilateral 03/19/2022  ? Procedure: XI ROBOTIC ASSISTED TOTAL HYSTERECTOMY WITH BILATERAL SALPINGO OOPHORECTOMY;  Surgeon: [name], MD;  Location: [facility];  Service: Gynecology;  Laterality: Bilateral;  ? SENTINEL NODE BIOPSY N/A 03/19/2022  ? Procedure: SENTINEL NODE BIOPSY;  Surgeon: [name], MD;  Location: [facility];  Service: Gynecology;  Laterality: N/A;  ? TUBAL LIGATION    ? WISDOM TOOTH EXTRACTION    ? ?Social History  ? ?Socioeconomic History  ? Marital status: Married  ?  Spouse name: Not on file  ? Number of children: Not on file  ? Years of education: Not on file  ? Highest education level: Not on file  ?Occupational History  ? Not on file  ?Tobacco Use  ? Smoking status: Never  ? Smokeless tobacco: Never  ?Vaping Use  ? Vaping Use: Never  used  ?Substance and Sexual Activity  ? Alcohol use: No  ? Drug use: Never  ? Sexual activity: Yes  ?  Birth control/protection: Post-menopausal  ?Other Topics Concern  ? Not on file  ?Social History Narrative  ? Not on file  ? ?Social Determinants of Health  ? ?Financial Resource Strain: Not on file  ?Food Insecurity: Not on file  ?Transportation Needs: Not on file  ?Physical Activity: Not on file  ?Stress: Not on file  ?Social Connections: Not on file  ? ?Family History  ?Adopted: Yes  ?Problem Relation Age of Onset  ? Uterine cancer Mother   ? ?Allergies  ?Allergen Reactions  ? Misc. Sulfonamide Containing Compounds Anaphylaxis  ? Sulfa Antibiotics Anaphylaxis  ? Cymbalta [Duloxetine Hcl]   ?  sedated  ? Lyrica [Pregabalin]   ?  drowsy  ? Neurontin [Gabapentin]   ?  drowsy   ? ?Current Outpatient Medications  ?Medication Sig Dispense Refill  ? meloxicam (MOBIC) 15 MG tablet Take 1 tablet (15 mg total) by mouth daily. 30 tablet 1  ? traMADol (ULTRAM) 50 MG tablet Take 1 tablet (50 mg total) by mouth every 6 (six) hours as needed for up to 14 days. 30 tablet 0  ? albuterol (VENTOLIN HFA) 108 (90 Base) MCG/ACT inhaler Inhale 2 puffs into the lungs every 6 (six) hours as needed for wheezing. 1 each 2  ? anastrozole (ARIMIDEX) 1 MG tablet Take 1 tablet (1 mg total) by mouth daily. 30 tablet 6  ? ergocalciferol (VITAMIN D2) 1.25 MG (50000 UT) capsule Take 1 capsule (50,000 Units total) by mouth once a week. 12 capsule 1  ? gabapentin (NEURONTIN) 100 MG capsule Take 1 capsule (100 mg total) by mouth at bedtime. 30 capsule 3  ? HYDROcodone-acetaminophen (NORCO/VICODIN) 5-325 MG tablet Take 1-2 tablets by mouth every 6 (six) hours as needed. 6 tablet 0  ? ibuprofen (ADVIL) 800 MG tablet Take 1 tablet (800 mg total) by mouth every 8 (eight) hours as needed for moderate pain. 30 tablet 0  ? lisinopril-hydrochlorothiazide (ZESTORETIC) 20-12.5 MG tablet Take 1 tablet by mouth once daily 30 tablet 0  ? ondansetron (ZOFRAN) 4 MG tablet Take 1 tablet (4 mg total) by mouth every 8 (eight) hours as needed. 30 tablet 1  ? senna-docusate (SENOKOT-S) 8.6-50 MG tablet Take 2 tablets by mouth at bedtime. For AFTER surgery, do not take if having diarrhea 30 tablet 0  ? ?No current facility-administered medications for this visit.  ? ?No results found. ? ?Review of Systems:   ?A ROS was performed including pertinent positives and negatives as documented in the HPI. ? ?Physical Exam :   ?Constitutional: NAD and appears stated age ?Neurological: Alert and oriented ?Psych: Appropriate affect and cooperative ?Last menstrual period 10/06/2011.  ? ?Comprehensive Musculoskeletal Exam:   ? ?Musculoskeletal Exam    ?Inspection Right Left  ?Skin No atrophy or winging No atrophy or winging  ?Palpation    ?Tenderness  Glenohumeral lateral deltoid None  ?Range of Motion    ?Flexion (passive) 130 170  ?Flexion (active) 30 170  ?Abduction 20 170  ?ER at the side 10 70  ?Can reach behind back to T12 T12  ?Strength    ? 4-5 supra and infraspinatus negative belly press Full  ?Special Tests    ?Pseudoparalytic No No  ?Neurologic    ?Fires PIN, radial, median, ulnar, musculocutaneous, axillary, suprascapular, long thoracic, and spinal accessory innervated muscles. No abnormal sensibility  ?Vascular/Lymphatic    ?Radial  Pulse 2+ 2+  ?Cervical Exam    ?Patient has symmetric cervical range of motion with negative Spurling's test.  ?Special Test:   ? ? ?She has tenderness palpation about the midshaft fibula.  There is minimal tenderness with a stress external rotation of the right ankle. ? ? ?Imaging:   ?Xray (2 views tib-fib, 1 view ankle external rotation stress view): ?Proximal fibular shaft fracture with no widening of her ankle mortise on stress exam ? ? ?I personally reviewed and interpreted the radiographs. ? ? ?Assessment:   ?56 y.o. female with a right proximal fibula fracture.  I did discuss that in the setting of this injury, I would like to perform a stress examination in order to rule out a Maisonneuve type injury.  On my stress x-ray, there does not appear to be any widening of her ankle mortise consistent with this.  As result I would like her to continue to weight-bear as tolerated.  She will consider a rolling walker as well.  I will see her back in 2 weeks for follow-up of this.  We will obtain repeat x-rays at that time.  With regard to her right shoulder I am quite concerned about a acute rotator cuff injury given the fact that she now has worsening range of motion following the injury.  To this effect and the fact that she would be more dependent on this arm to recover in terms of her lower body, I would like to obtain an MRI of the right shoulder. ? ?Plan :   ? ?-Plan for MRI right shoulder ?-Return to clinic in 3 weeks  to discuss results and for follow-up of her right fibula ? ? ?I believe that advance imaging in the form of an MRI is indicated for the following reasons: ?-Xrays images were obtained and not diagnostic ?-The p

## 2022-04-25 ENCOUNTER — Telehealth: Payer: Self-pay

## 2022-04-25 ENCOUNTER — Ambulatory Visit: Payer: 59 | Admitting: Radiation Oncology

## 2022-04-25 NOTE — Telephone Encounter (Signed)
I called the patient today about her upcoming follow-up appointment in radiation oncology.  ? ?Given the state of the COVID-19 pandemic, concerning case numbers in our community, and guidance from [facility], I offered a phone assessment with the patient to determine if coming to the clinic was necessary. She accepted. ? ?Patient has unfortunately been through some difficult times since completing her radiation. She underwent a total hysterectomy at the end of March for endometrial cancer. She also sustained a fall last month which fractured her right fibula and further injured her right rotator cuff. She is taking everything in stride (and reports her surgery and recovery for her hysterectomy went smoothly), but understandably stays fatigued and worn down. She continues to experience shooting pains to the left breast. I assured her this was a normal lingering side effect from her lumpectomy, but encouraged her to reach out to her breast surgeon for further guidance/reassurance.  She reports lingering hyperpigmentation to her skin within the treatment field, but otherwise reports skin is intact and well healed. I recommended that she continue skin care by applying oil or lotion with vitamin E to the skin in the radiation fields, BID, for 2 more months.   ? ?Continue follow-up with medical oncology - follow-up is scheduled on 10/01/2022 with Dr. Delton Coombes.  I explained that yearly mammograms are important for patients with intact breast tissue (she has one already scheduled for 09/23/2022), and physical exams are important after mastectomy for patients that cannot undergo mammography. ? ?I encouraged her to call if she had further questions or concerns about her healing. Otherwise, she will follow-up PRN in radiation oncology. Patient is pleased with this plan, and we will cancel her upcoming follow-up to reduce the risk of COVID-19 transmission. ? ? ?

## 2022-05-01 ENCOUNTER — Ambulatory Visit (HOSPITAL_COMMUNITY)
Admission: RE | Admit: 2022-05-01 | Discharge: 2022-05-01 | Disposition: A | Discharge status: Home / Self Care | Payer: 59 | Source: Ambulatory Visit | Attending: Orthopaedic Surgery | Admitting: Orthopaedic Surgery

## 2022-05-01 DIAGNOSIS — M7581 Other shoulder lesions, right shoulder: Secondary | ICD-10-CM | POA: Insufficient documentation

## 2022-05-03 ENCOUNTER — Ambulatory Visit (HOSPITAL_COMMUNITY): Payer: 59

## 2022-05-06 ENCOUNTER — Ambulatory Visit (INDEPENDENT_AMBULATORY_CARE_PROVIDER_SITE_OTHER): Payer: 59 | Admitting: Family Medicine

## 2022-05-06 ENCOUNTER — Ambulatory Visit: Payer: 59 | Admitting: Orthopedic Surgery

## 2022-05-06 ENCOUNTER — Encounter: Payer: Self-pay | Admitting: Family Medicine

## 2022-05-06 VITALS — BP 139/79 | HR 59 | Temp 97.2°F | Wt 252.4 lb

## 2022-05-06 DIAGNOSIS — E785 Hyperlipidemia, unspecified: Secondary | ICD-10-CM | POA: Diagnosis not present

## 2022-05-06 DIAGNOSIS — G8929 Other chronic pain: Secondary | ICD-10-CM

## 2022-05-06 DIAGNOSIS — K219 Gastro-esophageal reflux disease without esophagitis: Secondary | ICD-10-CM

## 2022-05-06 DIAGNOSIS — M797 Fibromyalgia: Secondary | ICD-10-CM | POA: Diagnosis not present

## 2022-05-06 DIAGNOSIS — S46011D Strain of muscle(s) and tendon(s) of the rotator cuff of right shoulder, subsequent encounter: Secondary | ICD-10-CM

## 2022-05-06 DIAGNOSIS — F321 Major depressive disorder, single episode, moderate: Secondary | ICD-10-CM

## 2022-05-06 DIAGNOSIS — M545 Low back pain, unspecified: Secondary | ICD-10-CM

## 2022-05-06 MED ORDER — HYDROCODONE-ACETAMINOPHEN 10-325 MG PO TABS: 1.0000 | ORAL_TABLET | ORAL | 0 refills | Status: AC | PRN

## 2022-05-06 MED ORDER — ESCITALOPRAM OXALATE 10 MG PO TABS: 10.0000 mg | ORAL_TABLET | Freq: Every day | ORAL | 3 refills | Status: DC

## 2022-05-06 MED ORDER — HYDROCODONE-ACETAMINOPHEN 10-325 MG PO TABS: 1.0000 | ORAL_TABLET | ORAL | 0 refills | Status: DC | PRN

## 2022-05-06 MED ORDER — FAMOTIDINE 40 MG PO TABS: 40.0000 mg | ORAL_TABLET | Freq: Every day | ORAL | 3 refills | Status: DC

## 2022-05-06 NOTE — Progress Notes (Signed)
? ?  Subjective:  ? ? Patient ID: Tammy Calderon, female    DOB: May 05, 1966, 56 y.o.   MRN: [id number] ? ?HPI ?Pt here for follow up. Pt states blood pressure has been normal. Having some musculoskeletal pain. Pt has since broken her right tibia and had a rotator cuff on right side. Pt has also had breast surgery and hysterectomy. Pt has been taken Hydrocodone 5-325 mg and that has not been helping. Usually would take PRN(pt has been out of pain pills for a few days). Pt had some Oxycodone left from surgery and that does help.  ? ?The 10-year ASCVD risk score (Arnett DK, et al., 2019) is: 3.7% ?  Values used to calculate the score: ?    Age: 61 years ?    Sex: Female ?    Is Non-Hispanic African American: No ?    Diabetic: No ?    Tobacco smoker: No ?    Systolic Blood Pressure: 630 mmHg ?    Is BP treated: Yes ?    HDL Cholesterol: 66 mg/dL ?    Total Cholesterol: 298 mg/dL ?Patient has been under a lot of stress ?She has gone through breast cancer, uterine cancer, fracture of the right lower leg, torn rotator cuff, more than likely facing surgery of the rotator cuff ?In addition to this having severe ongoing pain related to her leg that plain hydrocodone does not do enough for ?She also suffers with fibromyalgia as well as morbid obesity ?And also currently suffering with major depression without suicidal ideation ? ?Review of Systems ? ?   ?Objective:  ? Physical Exam ?General-in no acute distress ?Eyes-no discharge ?Lungs-respiratory rate normal, CTA ?CV-no murmurs,RRR ?Extremities skin warm dry no edema ?Neuro grossly normal ?Behavior normal, alert ? ? ? ? ?   ?Assessment & Plan:  ?1. Traumatic incomplete tear of right rotator cuff, subsequent encounter ?More than likely facing surgery ?Hydrocodone 10 mg every 6 hours as needed severe pain caution drowsiness not for frequent use prescription given ? ?2. Hyperlipidemia, unspecified hyperlipidemia type ?Important to keep LDL below 100 check lipid profile more than  likely recommend statin depending on the results ?- Lipid Profile ? ?3. Gastroesophageal reflux disease without esophagitis ?Continue acid blocker.  Healthy diet recommended. ? ?4. Fibromyalgia ?Patient currently ?Gabapentin.  Gentle stretching recommended ? ?5. Morbid obesity ([place]) ?Morbid obesity makes it difficult for her to get around.  Healthy diet portion control ? ?6. Chronic bilateral low back pain, unspecified whether sciatica present ?Stretches recommended.  Do not recommend MRI at this point ? ?Major depression recommend antidepressant Lexapro 10 mg daily.  Recheck patient in 8 weeks ?Patient not suicidal ?Patient working her way through breast cancer on medication currently for prevention ?Patient had hysterectomy for uterine cancer ?Between the fibromyalgia her morbid obesity and limited physical skills related to her right shoulder injury and chronic lumbar pain patient relates she is very debilitated unable to get around unable to do any type of meaningful activity she appears to be disabled. ? ? ?

## 2022-05-13 ENCOUNTER — Other Ambulatory Visit: Payer: Self-pay | Admitting: Family Medicine

## 2022-05-14 ENCOUNTER — Ambulatory Visit (INDEPENDENT_AMBULATORY_CARE_PROVIDER_SITE_OTHER): Payer: 59

## 2022-05-14 ENCOUNTER — Other Ambulatory Visit (HOSPITAL_BASED_OUTPATIENT_CLINIC_OR_DEPARTMENT_OTHER): Payer: Self-pay

## 2022-05-14 ENCOUNTER — Ambulatory Visit (INDEPENDENT_AMBULATORY_CARE_PROVIDER_SITE_OTHER): Payer: 59 | Admitting: Orthopaedic Surgery

## 2022-05-14 DIAGNOSIS — M25511 Pain in right shoulder: Secondary | ICD-10-CM | POA: Diagnosis not present

## 2022-05-14 DIAGNOSIS — M25572 Pain in left ankle and joints of left foot: Secondary | ICD-10-CM

## 2022-05-14 DIAGNOSIS — S82831D Other fracture of upper and lower end of right fibula, subsequent encounter for closed fracture with routine healing: Secondary | ICD-10-CM

## 2022-05-14 MED ORDER — TRAMADOL HCL 50 MG PO TABS
50.0000 mg | ORAL_TABLET | Freq: Four times a day (QID) | ORAL | 0 refills | Status: DC | PRN
  Filled 2022-05-14: qty 30, 8d supply, fill #0

## 2022-05-14 MED ORDER — TRIAMCINOLONE ACETONIDE 40 MG/ML IJ SUSP
80.0000 mg | INTRAMUSCULAR | Status: AC | PRN
  Administered 2022-05-14: 80 mg via INTRA_ARTICULAR

## 2022-05-14 MED ORDER — LIDOCAINE HCL 1 % IJ SOLN
4.0000 mL | INTRAMUSCULAR | Status: AC | PRN
  Administered 2022-05-14: 4 mL

## 2022-05-14 NOTE — Progress Notes (Signed)
Chief Complaint: Right shoulder pain     History of Present Illness:   05/14/2022: Presents today for follow-up of her right shoulder as well as right fibular fracture.  She has been able to progress her weightbearing on the right side.  She states that her ankle splint is helping her significantly.  Regard to the right shoulder she continues to lose range of motion and have increasing pain.  She is here today for MRI review of the right shoulder  Tammy Calderon is a 56 y.o. female ambidextrous female presents with right lower leg pain as well as right shoulder pain after a fall on her way to church.  She landed directly on the right side.  She states that she has not previously have any issues with the right leg.  Since that time she has been having difficulty placing weight directly on the right leg.  She was brought to the emergency room and found to have a proximal third fibular shaft fracture.  She was given a knee immobilizer and made nonweightbearing on crutches.  She has had a very difficult time with crutches as result of her right shoulder.  She has previously been seen by an orthopedic surgeon for the right shoulder who has been treating her rotator cuff with an injection.  She did not get any relief from the injection.  Unfortunately since the fall she now has extremely limited range of motion.  She is not able to forward elevate her arm since the injury.  She is very concerned about this as she has been very dependent on her arms as a result of the injury to the lower leg.  She has been using crutches which is quite difficult.  Her husband is also helping her significantly to mobilize.  She has recently unfortunately been treated for both breast cancer and ovarian cancer requiring surgery.    Surgical History:   None  PMH/PSH/Family History/Social History/Meds/Allergies:    Past Medical History:  Diagnosis Date  . Anemia    past  . Asthma    uses  albuterol approx 1-2x a week  . Bell's palsy    resolved  . Breast cancer ([place])   . Fibromyalgia   . GERD (gastroesophageal reflux disease)   . Hypertension   . PONV (postoperative nausea and vomiting)    Past Surgical History:  Procedure Laterality Date  . BREAST BIOPSY WITH RADIO FREQUENCY LOCALIZER Left 12/27/2021   Procedure: EXCISIONAL BREAST BIOPSY WITH RADIO FREQUENCY LOCALIZER X2;  Surgeon: Virl Cagey, MD;  Location: [facility];  Service: General;  Laterality: Left;  . LYMPH NODE DISSECTION N/A 03/19/2022   Procedure: LYMPH NODE DISSECTION;  Surgeon: [name], MD;  Location: [facility];  Service: Gynecology;  Laterality: N/A;  . NASAL SEPTUM SURGERY    . ROBOTIC ASSISTED TOTAL HYSTERECTOMY WITH BILATERAL SALPINGO OOPHERECTOMY Bilateral 03/19/2022   Procedure: XI ROBOTIC ASSISTED TOTAL HYSTERECTOMY WITH BILATERAL SALPINGO OOPHORECTOMY;  Surgeon: [name], MD;  Location: [facility];  Service: Gynecology;  Laterality: Bilateral;  . SENTINEL NODE BIOPSY N/A 03/19/2022   Procedure: SENTINEL NODE BIOPSY;  Surgeon: [name], MD;  Location: [facility];  Service: Gynecology;  Laterality: N/A;  . TUBAL LIGATION    . [place] EXTRACTION     Social History  Socioeconomic History  . Marital status: Married    Spouse name: Not on file  . Number of children: Not on file  . Years of education: Not on file  . Highest education level: Not on file  Occupational History  . Not on file  Tobacco Use  . Smoking status: Never  . Smokeless tobacco: Never  Vaping Use  . Vaping Use: Never used  Substance and Sexual Activity  . Alcohol use: No  . Drug use: Never  . Sexual activity: Yes    Birth control/protection: Post-menopausal  Other Topics Concern  . Not on file  Social History Narrative  . Not on file   Social Determinants of Health   Financial Resource Strain: Not on file  Food Insecurity: Not on file  Transportation Needs: Not on file  Physical  Activity: Not on file  Stress: Not on file  Social Connections: Not on file   Family History  Adopted: Yes  Problem Relation Age of Onset  . Uterine cancer Mother    Allergies  Allergen Reactions  . Misc. Sulfonamide Containing Compounds Anaphylaxis  . Sulfa Antibiotics Anaphylaxis  . Cymbalta [Duloxetine Hcl]     sedated  . Lyrica [Pregabalin]     drowsy  . Neurontin [Gabapentin]     drowsy   Current Outpatient Medications  Medication Sig Dispense Refill  . traMADol (ULTRAM) 50 MG tablet Take 1 tablet (50 mg total) by mouth every 6 (six) hours as needed. 30 tablet 0  . albuterol (VENTOLIN HFA) 108 (90 Base) MCG/ACT inhaler Inhale 2 puffs into the lungs every 6 (six) hours as needed for wheezing. 1 each 2  . anastrozole (ARIMIDEX) 1 MG tablet Take 1 tablet (1 mg total) by mouth daily. 30 tablet 6  . ergocalciferol (VITAMIN D2) 1.25 MG (50000 UT) capsule Take 1 capsule (50,000 Units total) by mouth once a week. 12 capsule 1  . escitalopram (LEXAPRO) 10 MG tablet Take 1 tablet (10 mg total) by mouth daily. 30 tablet 3  . famotidine (PEPCID) 40 MG tablet Take 1 tablet (40 mg total) by mouth daily. 30 tablet 3  . gabapentin (NEURONTIN) 100 MG capsule Take 1 capsule (100 mg total) by mouth at bedtime. 30 capsule 3  . ibuprofen (ADVIL) 800 MG tablet Take 1 tablet (800 mg total) by mouth every 8 (eight) hours as needed for moderate pain. 30 tablet 0  . lisinopril-hydrochlorothiazide (ZESTORETIC) 20-12.5 MG tablet TAKE 1 TABLET BY MOUTH ONCE DAILY . APPOINTMENT REQUIRED FOR FUTURE REFILLS 30 tablet 1  . ondansetron (ZOFRAN) 4 MG tablet Take 1 tablet (4 mg total) by mouth every 8 (eight) hours as needed. 30 tablet 1  . senna-docusate (SENOKOT-S) 8.6-50 MG tablet Take 2 tablets by mouth at bedtime. For AFTER surgery, do not take if having diarrhea 30 tablet 0   No current facility-administered medications for this visit.   No results found.  Review of Systems:   A ROS was performed  including pertinent positives and negatives as documented in the HPI.  Physical Exam :   Constitutional: NAD and appears stated age Neurological: Alert and oriented Psych: Appropriate affect and cooperative Last menstrual period 10/06/2011.   Comprehensive Musculoskeletal Exam:    Musculoskeletal Exam    Inspection Right Left  Skin No atrophy or winging No atrophy or winging  Palpation    Tenderness Glenohumeral lateral deltoid None  Range of Motion    Flexion (passive) 130 170  Flexion (active) 30 170  Abduction 20 170  ER at the side 10 70  Can reach behind back to T12 T12  Strength     4-5 supra and infraspinatus negative belly press Full  Special Tests    Pseudoparalytic No No  Neurologic    Fires PIN, radial, median, ulnar, musculocutaneous, axillary, suprascapular, long thoracic, and spinal accessory innervated muscles. No abnormal sensibility  Vascular/Lymphatic    Radial Pulse 2+ 2+  Cervical Exam    Patient has symmetric cervical range of motion with negative Spurling's test.  Special Test:     She has tenderness palpation about the midshaft fibula.  There is minimal tenderness with a stress external rotation of the right ankle.   Imaging:   Xray (2 views tib-fib, 1 view ankle external rotation stress view): Proximal fibular shaft fracture with some interval callus formation  MRI right shoulder: There is a very small bursal sided supraspinatus tear as well as thickening of her capsule consistent with adhesive capsulitis  I personally reviewed and interpreted the radiographs.   Assessment:   56 y.o. female with a right proximal fibula fracture as well as right shoulder exam consistent with adhesive capsulitis.  I described that while her MRI does reveal a rotator cuff injury I believe her predominant issue is due to adhesive capsulitis.  To that effect I recommend an ultrasound-guided injection of the right shoulder.  I will see her back in 2 weeks.  In the  meantime I would like her to work on gentle range of motion and wall climbs.  She will continue to progress as tolerated with regard to her right fibula. Plan :    -Return to clinic in 2 weeks for reassessment    Procedure Note  Patient: STEHANIE EKSTROM             Date of Birth: 21-Oct-1966           MRN: [id number]             Visit Date: 05/14/2022  Procedures: Visit Diagnoses:  1. Closed fracture of proximal end of right fibula with routine healing, unspecified fracture morphology, subsequent encounter     Large Joint Inj: R glenohumeral on 05/14/2022 4:33 PM Indications: pain Details: 22 G 1.5 in needle, ultrasound-guided anterior approach  Arthrogram: No  Medications: 4 mL lidocaine 1 %; 80 mg triamcinolone acetonide 40 MG/ML Outcome: tolerated well, no immediate complications Procedure, treatment alternatives, risks and benefits explained, specific risks discussed. Consent was given by the patient. Immediately prior to procedure a time out was called to verify the correct patient, procedure, equipment, support staff and site/side marked as required. Patient was prepped and draped in the usual sterile fashion.          I personally saw and evaluated the patient, and participated in the management and treatment plan.  [name], MD Attending Physician, Orthopedic Surgery  This document was dictated using Dragon voice recognition software. A reasonable attempt at proof reading has been made to minimize errors.

## 2022-05-15 ENCOUNTER — Telehealth: Payer: Self-pay

## 2022-05-15 NOTE — Telephone Encounter (Signed)
Patient called wanting to know is it normal for her right shoulder/arm to be hurting this bad from the injection?  Cb# [phone].  Please advise.  Thank you.

## 2022-05-15 NOTE — Telephone Encounter (Signed)
FYI  CB pt to discuss and explain the pain she is describing is not concerning after an injection as some people have different results immediately after. Advised her to try an ice pack on the shoulder and if her pain does not subside or improve by tomorrow, to call us back. No further questions were asked.

## 2022-05-16 ENCOUNTER — Telehealth: Payer: Self-pay

## 2022-05-16 MED ORDER — HYDROCODONE-ACETAMINOPHEN 5-325 MG PO TABS: 1.0000 | ORAL_TABLET | Freq: Three times a day (TID) | ORAL | 0 refills | Status: DC | PRN

## 2022-05-16 NOTE — Telephone Encounter (Signed)
Neck step that I do above tramadol is hydrocodone I do not do oxycodone thank you

## 2022-05-16 NOTE — Telephone Encounter (Signed)
Prescription was sent for limited number [facility]

## 2022-05-16 NOTE — Telephone Encounter (Signed)
Patient notified

## 2022-05-16 NOTE — Telephone Encounter (Signed)
We can utilize hydrocodone for occasional use caution drowsiness 5 mg / 325 mg, #15, 1 taken 3 times daily as needed for pain use sparingly May pend then I will sign thank you

## 2022-05-16 NOTE — Telephone Encounter (Signed)
Pt called in stating that she was seen in office a few weeks ago and discussed with pcp about possibly getting a stronger pain med when and if she needed it. Pt states that she think she would like to try something stronger for the pain and if it could be sent to the pharmacy. Please advise.  Cb#: [phone]

## 2022-05-16 NOTE — Telephone Encounter (Signed)
Pt contacted and states that when she was in provider said something about calling in Oxycodone. Pt states Tramadol made her arm worse. Pt informed that I would send the message back to provider but it may not be responded to until later on and informed patient that we were closed on Monday. Pt verbalized understanding. Pt made aware she may receive a my chart message. Please advise. Thank you.

## 2022-05-20 ENCOUNTER — Telehealth: Payer: Self-pay | Admitting: Orthopaedic Surgery

## 2022-05-20 NOTE — Telephone Encounter (Signed)
Pt called back stating her shoulder is still not better and asking for a return call from [place]. Please call pt at [phone].

## 2022-05-21 NOTE — Telephone Encounter (Signed)
[facility] stating Dr. Sammuel Hines would like to see patient in clinic for shoudler

## 2022-05-22 ENCOUNTER — Ambulatory Visit (HOSPITAL_BASED_OUTPATIENT_CLINIC_OR_DEPARTMENT_OTHER): Payer: 59 | Admitting: Orthopaedic Surgery

## 2022-05-23 ENCOUNTER — Ambulatory Visit (HOSPITAL_BASED_OUTPATIENT_CLINIC_OR_DEPARTMENT_OTHER): Payer: 59 | Admitting: Orthopaedic Surgery

## 2022-05-28 ENCOUNTER — Ambulatory Visit (HOSPITAL_BASED_OUTPATIENT_CLINIC_OR_DEPARTMENT_OTHER): Payer: 59 | Admitting: Orthopaedic Surgery

## 2022-06-04 ENCOUNTER — Ambulatory Visit (INDEPENDENT_AMBULATORY_CARE_PROVIDER_SITE_OTHER): Payer: 59

## 2022-06-04 ENCOUNTER — Other Ambulatory Visit (HOSPITAL_BASED_OUTPATIENT_CLINIC_OR_DEPARTMENT_OTHER): Payer: Self-pay

## 2022-06-04 ENCOUNTER — Ambulatory Visit (INDEPENDENT_AMBULATORY_CARE_PROVIDER_SITE_OTHER): Payer: 59 | Admitting: Orthopaedic Surgery

## 2022-06-04 DIAGNOSIS — S46011A Strain of muscle(s) and tendon(s) of the rotator cuff of right shoulder, initial encounter: Secondary | ICD-10-CM

## 2022-06-04 DIAGNOSIS — S82831D Other fracture of upper and lower end of right fibula, subsequent encounter for closed fracture with routine healing: Secondary | ICD-10-CM | POA: Diagnosis not present

## 2022-06-04 MED ORDER — OXYCODONE HCL 5 MG PO TABS
5.0000 mg | ORAL_TABLET | ORAL | 0 refills | Status: DC | PRN
  Filled 2022-06-04: qty 20, 4d supply, fill #0

## 2022-06-04 MED ORDER — ACETAMINOPHEN 500 MG PO TABS
500.0000 mg | ORAL_TABLET | Freq: Three times a day (TID) | ORAL | 0 refills | Status: AC
  Filled 2022-06-04: qty 30, 10d supply, fill #0

## 2022-06-04 MED ORDER — ASPIRIN 325 MG PO TBEC
325.0000 mg | DELAYED_RELEASE_TABLET | Freq: Every day | ORAL | 0 refills | Status: DC
  Filled 2022-06-04: qty 30, 30d supply, fill #0

## 2022-06-04 MED ORDER — IBUPROFEN 800 MG PO TABS
800.0000 mg | ORAL_TABLET | Freq: Three times a day (TID) | ORAL | 0 refills | Status: AC
  Filled 2022-06-04: qty 30, 10d supply, fill #0

## 2022-06-04 NOTE — Progress Notes (Signed)
Chief Complaint: Right shoulder pain     History of Present Illness:   06/04/2022: Presents today for follow-up of her right shoulder as well as right fibular fracture.  Overall she continues to feel much better with regard to her walking and lower extremity pain.  That being said her right shoulder remains very symptomatic.  At the last visit an intra-articular ultrasound-guided injection was performed and she did not get any relief from this if anything she states that she felt worse for a week.  She remains very disabled and is not able to perform basic hygiene or do her hair.  She is very concerned about the right shoulder.  She has been working on a home shoulder program which provided significant improvement.   Tammy Calderon is a 56 y.o. female ambidextrous female presents with right lower leg pain as well as right shoulder pain after a fall on her way to church.  She landed directly on the right side.  She states that she has not previously have any issues with the right leg.  Since that time she has been having difficulty placing weight directly on the right leg.  She was brought to the emergency room and found to have a proximal third fibular shaft fracture.  She was given a knee immobilizer and made nonweightbearing on crutches.  She has had a very difficult time with crutches as result of her right shoulder.  She has previously been seen by an orthopedic surgeon for the right shoulder who has been treating her rotator cuff with an injection.  She did not get any relief from the injection.  Unfortunately since the fall she now has extremely limited range of motion.  She is not able to forward elevate her arm since the injury.  She is very concerned about this as she has been very dependent on her arms as a result of the injury to the lower leg.  She has been using crutches which is quite difficult.  Her husband is also helping her significantly to mobilize.  She  has recently unfortunately been treated for both breast cancer and ovarian cancer requiring surgery.    Surgical History:   None  PMH/PSH/Family History/Social History/Meds/Allergies:    Past Medical History:  Diagnosis Date   Anemia    past   Asthma    uses albuterol approx 1-2x a week   Bell's palsy    resolved   Breast cancer (HCC)    Fibromyalgia    GERD (gastroesophageal reflux disease)    Hypertension    PONV (postoperative nausea and vomiting)    Past Surgical History:  Procedure Laterality Date   BREAST BIOPSY WITH RADIO FREQUENCY LOCALIZER Left 12/27/2021   Procedure: EXCISIONAL BREAST BIOPSY WITH RADIO FREQUENCY LOCALIZER X2;  Surgeon: Virl Cagey, MD;  Location: [facility];  Service: General;  Laterality: Left;   LYMPH NODE DISSECTION N/A 03/19/2022   Procedure: LYMPH NODE DISSECTION;  Surgeon: [name], MD;  Location: [facility];  Service: Gynecology;  Laterality: N/A;   NASAL SEPTUM SURGERY     ROBOTIC ASSISTED TOTAL HYSTERECTOMY WITH BILATERAL SALPINGO OOPHERECTOMY Bilateral 03/19/2022   Procedure: XI ROBOTIC ASSISTED TOTAL HYSTERECTOMY WITH BILATERAL SALPINGO OOPHORECTOMY;  Surgeon: [name], MD;  Location: [facility];  Service: Gynecology;  Laterality: Bilateral;  SENTINEL NODE BIOPSY N/A 03/19/2022   Procedure: SENTINEL NODE BIOPSY;  Surgeon: [name], MD;  Location: [facility];  Service: Gynecology;  Laterality: N/A;   TUBAL LIGATION     WISDOM TOOTH EXTRACTION     Social History   Socioeconomic History   Marital status: Married    Spouse name: Not on file   Number of children: Not on file   Years of education: Not on file   Highest education level: Not on file  Occupational History   Not on file  Tobacco Use   Smoking status: Never   Smokeless tobacco: Never  Vaping Use   Vaping Use: Never used  Substance and Sexual Activity   Alcohol use: No   Drug use: Never   Sexual activity: Yes    Birth control/protection:  Post-menopausal  Other Topics Concern   Not on file  Social History Narrative   Not on file   Social Determinants of Health   Financial Resource Strain: Not on file  Food Insecurity: Not on file  Transportation Needs: Not on file  Physical Activity: Not on file  Stress: Not on file  Social Connections: Not on file   Family History  Adopted: Yes  Problem Relation Age of Onset   Uterine cancer Mother    Allergies  Allergen Reactions   Misc. Sulfonamide Containing Compounds Anaphylaxis   Sulfa Antibiotics Anaphylaxis   Cymbalta [Duloxetine Hcl]     sedated   Lyrica [Pregabalin]     drowsy   Neurontin [Gabapentin]     drowsy   Current Outpatient Medications  Medication Sig Dispense Refill   albuterol (VENTOLIN HFA) 108 (90 Base) MCG/ACT inhaler Inhale 2 puffs into the lungs every 6 (six) hours as needed for wheezing. 1 each 2   anastrozole (ARIMIDEX) 1 MG tablet Take 1 tablet (1 mg total) by mouth daily. 30 tablet 6   ergocalciferol (VITAMIN D2) 1.25 MG (50000 UT) capsule Take 1 capsule (50,000 Units total) by mouth once a week. 12 capsule 1   escitalopram (LEXAPRO) 10 MG tablet Take 1 tablet (10 mg total) by mouth daily. 30 tablet 3   famotidine (PEPCID) 40 MG tablet Take 1 tablet (40 mg total) by mouth daily. 30 tablet 3   gabapentin (NEURONTIN) 100 MG capsule Take 1 capsule (100 mg total) by mouth at bedtime. 30 capsule 3   HYDROcodone-acetaminophen (NORCO/VICODIN) 5-325 MG tablet Take 1 tablet by mouth 3 (three) times daily as needed for severe pain (use sparingly). 15 tablet 0   ibuprofen (ADVIL) 800 MG tablet Take 1 tablet (800 mg total) by mouth every 8 (eight) hours as needed for moderate pain. 30 tablet 0   lisinopril-hydrochlorothiazide (ZESTORETIC) 20-12.5 MG tablet TAKE 1 TABLET BY MOUTH ONCE DAILY . APPOINTMENT REQUIRED FOR FUTURE REFILLS 30 tablet 1   ondansetron (ZOFRAN) 4 MG tablet Take 1 tablet (4 mg total) by mouth every 8 (eight) hours as needed. 30 tablet 1    senna-docusate (SENOKOT-S) 8.6-50 MG tablet Take 2 tablets by mouth at bedtime. For AFTER surgery, do not take if having diarrhea 30 tablet 0   traMADol (ULTRAM) 50 MG tablet Take 1 tablet (50 mg total) by mouth every 6 (six) hours as needed. 30 tablet 0   No current facility-administered medications for this visit.   No results found.  Review of Systems:   A ROS was performed including pertinent positives and negatives as documented in the HPI.  Physical Exam :   Constitutional: NAD and  appears stated age Neurological: Alert and oriented Psych: Appropriate affect and cooperative Last menstrual period 10/06/2011.   Comprehensive Musculoskeletal Exam:    Musculoskeletal Exam    Inspection Right Left  Skin No atrophy or winging No atrophy or winging  Palpation    Tenderness Glenohumeral lateral deltoid None  Range of Motion    Flexion (passive) 130 170  Flexion (active) 30 170  Abduction 20 170  ER at the side 10 70  Can reach behind back to T12 T12  Strength     4-5 supraspinatus negative belly press Full  Special Tests    Pseudoparalytic No No  Neurologic    Fires PIN, radial, median, ulnar, musculocutaneous, axillary, suprascapular, long thoracic, and spinal accessory innervated muscles. No abnormal sensibility  Vascular/Lymphatic    Radial Pulse 2+ 2+  Cervical Exam    Patient has symmetric cervical range of motion with negative Spurling's test.  Special Test: Speed's Test with flexion of the elbow    She has tenderness palpation about the midshaft fibula.  There is minimal tenderness with a stress external rotation of the right ankle.   Imaging:   Xray (2 views tib-fib, 1 view ankle external rotation stress view): Proximal fibular shaft fracture with some interval callus formation  MRI right shoulder: She does have a small full-thickness tear involving the anterior supraspinatus tendon with some shoulder capsular thickening  I personally reviewed and  interpreted the radiographs.   Assessment:   56 y.o. female with a right proximal fibula fracture which is overall recovering well.  She does also have a right full-thickness rotator cuff tear which has remained very symptomatic.  Overall I have counseled her that given the fact that she did not experience any relief from an ultrasound-guided injection of the glenohumeral joint I do believe that it is less likely that she is suffering from adhesive capsulitis as opposed to a very symptomatic rotator cuff tear.  Given the fact that this is a traumatic injury after a fall 1 month prior where she landed directly on the side I do believe that surgical reconstruction would be an option.  Specifically we discussed treatment options including a formal structured physical therapy plan versus acute rotator cuff repair.  At this time she has gotten little to no relief from her injection and as result I do not believe that she would be able to progress very well with physical therapy.  We did also discuss the risk of tear progression and given the fact that this was an acute traumatic injury I do believe that there is a role for acute surgical treatment.  After discussion of the risks and benefits she has elected for right shoulder rotator cuff repair.  We also plan for right shoulder biceps tenodesis. Plan :    -Plan for right shoulder arthroscopy with rotator cuff repair and biceps tenodesis    After a lengthy discussion of treatment options, including risks, benefits, alternatives, complications of surgical and nonsurgical conservative options, the patient elected surgical repair.   The patient  is aware of the material risks  and complications including, but not limited to injury to adjacent structures, neurovascular injury, infection, numbness, bleeding, implant failure, thermal burns, stiffness, persistent pain, failure to heal, disease transmission from allograft, need for further surgery, dislocation,  anesthetic risks, blood clots, risks of death,and others. The probabilities of surgical success and failure discussed with patient given their particular co-morbidities.The time and nature of expected rehabilitation and recovery was discussed.The patient's questions were  all answered preoperatively.  No barriers to understanding were noted. I explained the natural history of the disease process and Rx rationale.  I explained to the patient what I considered to be reasonable expectations given their personal situation.  The final treatment plan was arrived at through a shared patient decision making process model.        I personally saw and evaluated the patient, and participated in the management and treatment plan.  [name], MD Attending Physician, Orthopedic Surgery  This document was dictated using Dragon voice recognition software. A reasonable attempt at proof reading has been made to minimize errors.

## 2022-06-30 ENCOUNTER — Encounter: Payer: Self-pay | Admitting: Family Medicine

## 2022-07-01 ENCOUNTER — Ambulatory Visit: Payer: Self-pay | Admitting: Family Medicine

## 2022-07-03 ENCOUNTER — Ambulatory Visit (INDEPENDENT_AMBULATORY_CARE_PROVIDER_SITE_OTHER): Payer: 59 | Admitting: Nurse Practitioner

## 2022-07-03 ENCOUNTER — Encounter: Payer: Self-pay | Admitting: Nurse Practitioner

## 2022-07-03 VITALS — BP 120/70 | HR 70 | Temp 97.9°F | Ht 61.0 in | Wt 242.0 lb

## 2022-07-03 DIAGNOSIS — M25511 Pain in right shoulder: Secondary | ICD-10-CM | POA: Diagnosis not present

## 2022-07-03 NOTE — Progress Notes (Signed)
   Subjective:    Patient ID: Tammy Calderon, female    DOB: May 03, 1966, 57 y.o.   MRN: [id number]  HPI Patient is here for surgical clearance of her R shoulder for rotator cuff and frozen shoulder.  Patient denies any cardiac history, pulmonary history, or renal disease.  Reviewed chart for any cardiopulmonary diseases or renal diseases.  Reviewed patient's medications in detail.  Patient states that she is no longer taking hydrocodone and she is just taking ibuprofen for pain management.  Patient has no other concerns   Review of Systems  Musculoskeletal:        Right shoulder pain       Objective:   Physical Exam Vitals reviewed.  Constitutional:      General: She is not in acute distress.    Appearance: Normal appearance. She is normal weight. She is not ill-appearing, toxic-appearing or diaphoretic.  HENT:     Head: Normocephalic and atraumatic.  Cardiovascular:     Rate and Rhythm: Normal rate and regular rhythm.     Pulses: Normal pulses.     Heart sounds: Normal heart sounds. No murmur heard. Pulmonary:     Effort: Pulmonary effort is normal. No respiratory distress.     Breath sounds: Normal breath sounds. No wheezing.  Musculoskeletal:     Comments: Grossly intact.  Limited range of motion to right shoulder  Skin:    General: Skin is warm.     Capillary Refill: Capillary refill takes less than 2 seconds.  Neurological:     Mental Status: She is alert.     Comments: Grossly intact  Psychiatric:        Mood and Affect: Mood normal.        Behavior: Behavior normal.        Assessment & Plan:   1. Acute pain of right shoulder -Reviewed patient's chart and saw no restrictions to surgery.  Patient cleared for surgery. -Return to clinic after surgery for follow-up -May continue taking ibuprofen as needed for pain -Follow preop suggestions from Ortho surgeon in Ortho clinic.    Note:  This document was prepared using Dragon voice recognition software and may  include unintentional dictation errors. Note - This record has been created using [employer].  Chart creation errors have been sought, but may not always  have been located. Such creation errors do not reflect on  the standard of medical care.

## 2022-07-07 ENCOUNTER — Other Ambulatory Visit (HOSPITAL_BASED_OUTPATIENT_CLINIC_OR_DEPARTMENT_OTHER): Payer: Self-pay | Admitting: Orthopaedic Surgery

## 2022-07-07 ENCOUNTER — Other Ambulatory Visit: Payer: Self-pay

## 2022-07-07 ENCOUNTER — Encounter (HOSPITAL_BASED_OUTPATIENT_CLINIC_OR_DEPARTMENT_OTHER): Payer: Self-pay | Admitting: Orthopaedic Surgery

## 2022-07-07 DIAGNOSIS — S46011A Strain of muscle(s) and tendon(s) of the rotator cuff of right shoulder, initial encounter: Secondary | ICD-10-CM

## 2022-07-08 ENCOUNTER — Encounter (HOSPITAL_BASED_OUTPATIENT_CLINIC_OR_DEPARTMENT_OTHER)
Admission: RE | Admit: 2022-07-08 | Discharge: 2022-07-08 | Disposition: A | Discharge status: Home / Self Care | Payer: 59 | Source: Ambulatory Visit | Attending: Orthopaedic Surgery | Admitting: Orthopaedic Surgery

## 2022-07-08 DIAGNOSIS — Z01818 Encounter for other preprocedural examination: Secondary | ICD-10-CM | POA: Diagnosis not present

## 2022-07-08 DIAGNOSIS — Z01812 Encounter for preprocedural laboratory examination: Secondary | ICD-10-CM | POA: Diagnosis present

## 2022-07-08 DIAGNOSIS — Z79899 Other long term (current) drug therapy: Secondary | ICD-10-CM | POA: Diagnosis not present

## 2022-07-08 LAB — BASIC METABOLIC PANEL
Anion gap: 9 (ref 5–15)
BUN: 21 mg/dL — ABNORMAL HIGH (ref 6–20)
CO2: 24 mmol/L (ref 22–32)
Calcium: 9.5 mg/dL (ref 8.9–10.3)
Chloride: 103 mmol/L (ref 98–111)
Creatinine, Ser: 1.02 mg/dL — ABNORMAL HIGH (ref 0.44–1.00)
GFR, Estimated: >60 mL/min (ref >60)
Glucose, Bld: 90 mg/dL (ref 70–99)
Potassium: 4.3 mmol/L (ref 3.5–5.1)
Sodium: 136 mmol/L (ref 135–145)

## 2022-07-10 ENCOUNTER — Ambulatory Visit (HOSPITAL_BASED_OUTPATIENT_CLINIC_OR_DEPARTMENT_OTHER): Payer: Self-pay | Admitting: Orthopaedic Surgery

## 2022-07-10 DIAGNOSIS — S46011A Strain of muscle(s) and tendon(s) of the rotator cuff of right shoulder, initial encounter: Secondary | ICD-10-CM

## 2022-07-13 NOTE — Anesthesia Preprocedure Evaluation (Signed)
Anesthesia Evaluation  Patient identified by MRN, date of birth, ID band Patient awake    Reviewed: Allergy & Precautions, NPO status , Patient's Chart, lab work & pertinent test results  History of Anesthesia Complications (+) PONV and history of anesthetic complications  Airway Mallampati: II  TM Distance: >3 FB Neck ROM: Full    Dental no notable dental hx.    Pulmonary asthma ,    Pulmonary exam normal        Cardiovascular hypertension, Pt. on medications Normal cardiovascular exam     Neuro/Psych negative neurological ROS  negative psych ROS   GI/Hepatic Neg liver ROS, GERD  Medicated and Controlled,  Endo/Other  Morbid obesity  Renal/GU negative Renal ROS  negative genitourinary   Musculoskeletal  (+) Fibromyalgia -  Abdominal   Peds  Hematology negative hematology ROS (+)   Anesthesia Other Findings Hx of breast ca  Reproductive/Obstetrics negative OB ROS                            Anesthesia Physical Anesthesia Plan  ASA: 3  Anesthesia Plan: General   Post-op Pain Management: Tylenol PO (pre-op)* and Regional block*   Induction: Intravenous  PONV Risk Score and Plan: 4 or greater and Treatment may vary due to age or medical condition, Midazolam, Ondansetron, Dexamethasone, Propofol infusion and Scopolamine patch - Pre-op  Airway Management Planned: Oral ETT  Additional Equipment: None  Intra-op Plan:   Post-operative Plan: Extubation in OR  Informed Consent: I have reviewed the patients History and Physical, chart, labs and discussed the procedure including the risks, benefits and alternatives for the proposed anesthesia with the patient or authorized representative who has indicated his/her understanding and acceptance.     Dental advisory given  Plan Discussed with: CRNA  Anesthesia Plan Comments:       Anesthesia Quick Evaluation

## 2022-07-14 ENCOUNTER — Telehealth: Payer: Self-pay | Admitting: Orthopaedic Surgery

## 2022-07-14 NOTE — Telephone Encounter (Signed)
Patient called. She needs to know when she should start her PT. How many days after surgery? Her call back number is [phone]

## 2022-07-14 NOTE — Telephone Encounter (Signed)
Informed patient Dr. Sammuel Hines has patients seen with PT 3-5 days after surgery

## 2022-07-15 ENCOUNTER — Ambulatory Visit (HOSPITAL_BASED_OUTPATIENT_CLINIC_OR_DEPARTMENT_OTHER): Payer: 59

## 2022-07-15 ENCOUNTER — Encounter (HOSPITAL_BASED_OUTPATIENT_CLINIC_OR_DEPARTMENT_OTHER)
Admission: RE | Disposition: A | Discharge status: Home / Self Care | Payer: Self-pay | Source: Home / Self Care | Attending: Orthopaedic Surgery

## 2022-07-15 ENCOUNTER — Ambulatory Visit (HOSPITAL_BASED_OUTPATIENT_CLINIC_OR_DEPARTMENT_OTHER): Payer: 59 | Admitting: Anesthesiology

## 2022-07-15 ENCOUNTER — Ambulatory Visit (HOSPITAL_BASED_OUTPATIENT_CLINIC_OR_DEPARTMENT_OTHER)
Admission: RE | Admit: 2022-07-15 | Discharge: 2022-07-15 | Disposition: A | Discharge status: Home / Self Care | Payer: 59 | Attending: Orthopaedic Surgery | Admitting: Orthopaedic Surgery

## 2022-07-15 ENCOUNTER — Encounter (HOSPITAL_BASED_OUTPATIENT_CLINIC_OR_DEPARTMENT_OTHER): Payer: Self-pay | Admitting: Orthopaedic Surgery

## 2022-07-15 ENCOUNTER — Other Ambulatory Visit: Payer: Self-pay

## 2022-07-15 DIAGNOSIS — K219 Gastro-esophageal reflux disease without esophagitis: Secondary | ICD-10-CM | POA: Insufficient documentation

## 2022-07-15 DIAGNOSIS — M75121 Complete rotator cuff tear or rupture of right shoulder, not specified as traumatic: Secondary | ICD-10-CM

## 2022-07-15 DIAGNOSIS — M7541 Impingement syndrome of right shoulder: Secondary | ICD-10-CM | POA: Diagnosis not present

## 2022-07-15 DIAGNOSIS — Z79899 Other long term (current) drug therapy: Secondary | ICD-10-CM

## 2022-07-15 DIAGNOSIS — S82491A Other fracture of shaft of right fibula, initial encounter for closed fracture: Secondary | ICD-10-CM | POA: Diagnosis not present

## 2022-07-15 DIAGNOSIS — W19XXXA Unspecified fall, initial encounter: Secondary | ICD-10-CM | POA: Diagnosis not present

## 2022-07-15 DIAGNOSIS — I10 Essential (primary) hypertension: Secondary | ICD-10-CM | POA: Insufficient documentation

## 2022-07-15 DIAGNOSIS — M7521 Bicipital tendinitis, right shoulder: Secondary | ICD-10-CM | POA: Diagnosis not present

## 2022-07-15 DIAGNOSIS — M25811 Other specified joint disorders, right shoulder: Secondary | ICD-10-CM | POA: Insufficient documentation

## 2022-07-15 DIAGNOSIS — M797 Fibromyalgia: Secondary | ICD-10-CM | POA: Diagnosis not present

## 2022-07-15 DIAGNOSIS — S46011A Strain of muscle(s) and tendon(s) of the rotator cuff of right shoulder, initial encounter: Secondary | ICD-10-CM

## 2022-07-15 DIAGNOSIS — M25511 Pain in right shoulder: Secondary | ICD-10-CM | POA: Diagnosis present

## 2022-07-15 DIAGNOSIS — Z01818 Encounter for other preprocedural examination: Secondary | ICD-10-CM

## 2022-07-15 HISTORY — PX: SHOULDER ARTHROSCOPY WITH ROTATOR CUFF REPAIR AND OPEN BICEPS TENODESIS: SHX6677

## 2022-07-15 SURGERY — SHOULDER ARTHROSCOPY WITH ROTATOR CUFF REPAIR AND OPEN BICEPS TENODESIS
Anesthesia: General | Laterality: Right

## 2022-07-15 MED ORDER — CEFAZOLIN SODIUM-DEXTROSE 2-4 GM/100ML-% IV SOLN
2.0000 g | INTRAVENOUS | Status: AC
  Administered 2022-07-15: 2 g via INTRAVENOUS

## 2022-07-15 MED ORDER — LIDOCAINE 2% (20 MG/ML) 5 ML SYRINGE
INTRAMUSCULAR | Status: DC | PRN
  Administered 2022-07-15: 100 mg via INTRAVENOUS

## 2022-07-15 MED ORDER — PHENYLEPHRINE HCL (PRESSORS) 10 MG/ML IV SOLN
INTRAVENOUS | Status: AC
  Filled 2022-07-15: qty 1

## 2022-07-15 MED ORDER — MIDAZOLAM HCL 2 MG/2ML IJ SOLN
2.0000 mg | Freq: Once | INTRAMUSCULAR | Status: AC
  Administered 2022-07-15: 2 mg via INTRAVENOUS

## 2022-07-15 MED ORDER — SODIUM CHLORIDE 0.9 % IR SOLN
Status: DC | PRN
  Administered 2022-07-15: 12000 mL

## 2022-07-15 MED ORDER — ACETAMINOPHEN 500 MG PO TABS: 1000.0000 mg | ORAL_TABLET | Freq: Once | ORAL | Status: DC

## 2022-07-15 MED ORDER — FENTANYL CITRATE (PF) 100 MCG/2ML IJ SOLN: 25.0000 ug | INTRAMUSCULAR | Status: DC | PRN

## 2022-07-15 MED ORDER — OXYCODONE HCL 5 MG PO TABS: 5.0000 mg | ORAL_TABLET | Freq: Once | ORAL | Status: DC | PRN

## 2022-07-15 MED ORDER — ACETAMINOPHEN 500 MG PO TABS
1000.0000 mg | ORAL_TABLET | Freq: Once | ORAL | Status: AC
  Administered 2022-07-15: 1000 mg via ORAL

## 2022-07-15 MED ORDER — EPHEDRINE SULFATE (PRESSORS) 50 MG/ML IJ SOLN
INTRAMUSCULAR | Status: DC | PRN
  Administered 2022-07-15 (×3): 5 mg via INTRAVENOUS

## 2022-07-15 MED ORDER — SUGAMMADEX SODIUM 500 MG/5ML IV SOLN
INTRAVENOUS | Status: AC
  Filled 2022-07-15: qty 5

## 2022-07-15 MED ORDER — PROPOFOL 10 MG/ML IV BOLUS
INTRAVENOUS | Status: AC
  Filled 2022-07-15: qty 20

## 2022-07-15 MED ORDER — PHENYLEPHRINE HCL (PRESSORS) 10 MG/ML IV SOLN
INTRAVENOUS | Status: DC | PRN
  Administered 2022-07-15: 80 ug via INTRAVENOUS
  Administered 2022-07-15: 160 ug via INTRAVENOUS

## 2022-07-15 MED ORDER — TRANEXAMIC ACID-NACL 1000-0.7 MG/100ML-% IV SOLN
INTRAVENOUS | Status: AC
  Filled 2022-07-15: qty 100

## 2022-07-15 MED ORDER — BUPIVACAINE-EPINEPHRINE (PF) 0.5% -1:200000 IJ SOLN
INTRAMUSCULAR | Status: DC | PRN
  Administered 2022-07-15: 15 mL via PERINEURAL

## 2022-07-15 MED ORDER — OXYCODONE HCL 5 MG/5ML PO SOLN: 5.0000 mg | Freq: Once | ORAL | Status: DC | PRN

## 2022-07-15 MED ORDER — BUPIVACAINE LIPOSOME 1.3 % IJ SUSP
INTRAMUSCULAR | Status: DC | PRN
  Administered 2022-07-15: 10 mL via PERINEURAL

## 2022-07-15 MED ORDER — FENTANYL CITRATE (PF) 100 MCG/2ML IJ SOLN
INTRAMUSCULAR | Status: DC | PRN
Start: 2022-07-15 — End: 2022-07-15
  Administered 2022-07-15: 50 ug via INTRAVENOUS

## 2022-07-15 MED ORDER — FENTANYL CITRATE (PF) 100 MCG/2ML IJ SOLN
INTRAMUSCULAR | Status: AC
  Filled 2022-07-15: qty 2

## 2022-07-15 MED ORDER — SCOPOLAMINE 1 MG/3DAYS TD PT72
MEDICATED_PATCH | TRANSDERMAL | Status: AC
Start: 2022-07-15 — End: ?
  Filled 2022-07-15: qty 1

## 2022-07-15 MED ORDER — LACTATED RINGERS IV SOLN: INTRAVENOUS | Status: DC

## 2022-07-15 MED ORDER — FENTANYL CITRATE (PF) 100 MCG/2ML IJ SOLN
100.0000 ug | Freq: Once | INTRAMUSCULAR | Status: AC
  Administered 2022-07-15: 100 ug via INTRAVENOUS

## 2022-07-15 MED ORDER — CEFAZOLIN SODIUM-DEXTROSE 2-4 GM/100ML-% IV SOLN
INTRAVENOUS | Status: AC
Start: 2022-07-15 — End: ?
  Filled 2022-07-15: qty 100

## 2022-07-15 MED ORDER — EPINEPHRINE PF 1 MG/ML IJ SOLN
INTRAMUSCULAR | Status: AC
  Filled 2022-07-15: qty 3

## 2022-07-15 MED ORDER — ONDANSETRON HCL 4 MG/2ML IJ SOLN
INTRAMUSCULAR | Status: DC | PRN
  Administered 2022-07-15: 4 mg via INTRAVENOUS

## 2022-07-15 MED ORDER — SCOPOLAMINE 1 MG/3DAYS TD PT72
1.0000 | MEDICATED_PATCH | Freq: Once | TRANSDERMAL | Status: DC
  Administered 2022-07-15: 1.5 mg via TRANSDERMAL

## 2022-07-15 MED ORDER — PHENYLEPHRINE HCL-NACL 20-0.9 MG/250ML-% IV SOLN
INTRAVENOUS | Status: DC | PRN
  Administered 2022-07-15: 25 ug/min via INTRAVENOUS

## 2022-07-15 MED ORDER — DEXAMETHASONE SODIUM PHOSPHATE 10 MG/ML IJ SOLN
INTRAMUSCULAR | Status: AC
  Filled 2022-07-15: qty 1

## 2022-07-15 MED ORDER — SUGAMMADEX SODIUM 500 MG/5ML IV SOLN
INTRAVENOUS | Status: DC | PRN
  Administered 2022-07-15: 300 mg via INTRAVENOUS

## 2022-07-15 MED ORDER — DEXAMETHASONE SODIUM PHOSPHATE 10 MG/ML IJ SOLN
INTRAMUSCULAR | Status: DC | PRN
  Administered 2022-07-15: 8 mg via INTRAVENOUS

## 2022-07-15 MED ORDER — ACETAMINOPHEN 500 MG PO TABS
ORAL_TABLET | ORAL | Status: AC
  Filled 2022-07-15: qty 2

## 2022-07-15 MED ORDER — ONDANSETRON HCL 4 MG/2ML IJ SOLN
INTRAMUSCULAR | Status: AC
  Filled 2022-07-15: qty 2

## 2022-07-15 MED ORDER — PROPOFOL 10 MG/ML IV BOLUS
INTRAVENOUS | Status: DC | PRN
  Administered 2022-07-15: 200 mg via INTRAVENOUS

## 2022-07-15 MED ORDER — ROCURONIUM BROMIDE 10 MG/ML (PF) SYRINGE
PREFILLED_SYRINGE | INTRAVENOUS | Status: AC
  Filled 2022-07-15: qty 10

## 2022-07-15 MED ORDER — AMISULPRIDE (ANTIEMETIC) 5 MG/2ML IV SOLN: 10.0000 mg | Freq: Once | INTRAVENOUS | Status: DC | PRN

## 2022-07-15 MED ORDER — TRANEXAMIC ACID-NACL 1000-0.7 MG/100ML-% IV SOLN
1000.0000 mg | INTRAVENOUS | Status: AC
  Administered 2022-07-15: 1000 mg via INTRAVENOUS

## 2022-07-15 MED ORDER — MIDAZOLAM HCL 2 MG/2ML IJ SOLN
INTRAMUSCULAR | Status: AC
  Filled 2022-07-15: qty 2

## 2022-07-15 MED ORDER — EPHEDRINE 5 MG/ML INJ
INTRAVENOUS | Status: AC
  Filled 2022-07-15: qty 5

## 2022-07-15 MED ORDER — ROCURONIUM BROMIDE 100 MG/10ML IV SOLN
INTRAVENOUS | Status: DC | PRN
  Administered 2022-07-15: 60 mg via INTRAVENOUS

## 2022-07-15 MED ORDER — LIDOCAINE 2% (20 MG/ML) 5 ML SYRINGE
INTRAMUSCULAR | Status: AC
Start: 2022-07-15 — End: ?
  Filled 2022-07-15: qty 5

## 2022-07-15 SURGICAL SUPPLY — 86 items
AID PSTN UNV HD RSTRNT DISP (MISCELLANEOUS) ×1
ANCH SUT 2 SWLK 19.1 CLS EYLT (Anchor) ×2 IMPLANT
ANCH SUT 2.6 FBRSTK 1.7 (Anchor) ×1 IMPLANT
ANCH SUT TGRTAPE 1.3X2.6X1.7 (Anchor) ×1 IMPLANT
ANCHOR SUT FBRTK 2.6 SP1.7 TAP (Anchor) ×1 IMPLANT
ANCHOR SUT FBRTK 2.6X1.7X2 (Anchor) ×1 IMPLANT
ANCHOR SWIVELOCK BIO 4.75X19.1 (Anchor) ×2 IMPLANT
APL PRP STRL LF DISP 70% ISPRP (MISCELLANEOUS) ×2
APL SKNCLS STERI-STRIP NONHPOA (GAUZE/BANDAGES/DRESSINGS)
BENZOIN TINCTURE PRP APPL 2/3 (GAUZE/BANDAGES/DRESSINGS) IMPLANT
BLADE EXCALIBUR 4.0X13 (MISCELLANEOUS) ×2 IMPLANT
BLADE SURG 15 STRL LF DISP TIS (BLADE) IMPLANT
BLADE SURG 15 STRL SS (BLADE)
BURR OVAL 8 FLU 4.0X13 (MISCELLANEOUS) IMPLANT
CANNULA 5.75X71 LONG (CANNULA) IMPLANT
CANNULA 7X7 TWIST-IN (CANNULA) IMPLANT
CANNULA PASSPORT 5 (CANNULA) IMPLANT
CANNULA PASSPORT BUTTON 10-40 (CANNULA) IMPLANT
CANNULA TWIST IN 8.25X7CM (CANNULA) IMPLANT
CHLORAPREP W/TINT 26 (MISCELLANEOUS) ×4 IMPLANT
CLSR STERI-STRIP ANTIMIC 1/2X4 (GAUZE/BANDAGES/DRESSINGS) IMPLANT
COOLER ICEMAN CLASSIC (MISCELLANEOUS) ×2 IMPLANT
DRAPE IMP U-DRAPE 54X76 (DRAPES) ×2 IMPLANT
DRAPE INCISE IOBAN 66X45 STRL (DRAPES) ×2 IMPLANT
DRAPE SHOULDER BEACH CHAIR (DRAPES) ×2 IMPLANT
DRAPE U-SHAPE 47X51 STRL (DRAPES) ×4 IMPLANT
DRSG PAD ABDOMINAL 8X10 ST (GAUZE/BANDAGES/DRESSINGS) ×2 IMPLANT
DW OUTFLOW CASSETTE/TUBE SET (MISCELLANEOUS) ×2 IMPLANT
ELECT REM PT RETURN 9FT ADLT (ELECTROSURGICAL) ×2
ELECTRODE REM PT RTRN 9FT ADLT (ELECTROSURGICAL) ×1 IMPLANT
GAUZE SPONGE 4X4 12PLY STRL (GAUZE/BANDAGES/DRESSINGS) ×2 IMPLANT
GAUZE XEROFORM 1X8 LF (GAUZE/BANDAGES/DRESSINGS) ×2 IMPLANT
GLOVE BIO SURGEON STRL SZ 6 (GLOVE) ×4 IMPLANT
GLOVE BIO SURGEON STRL SZ7.5 (GLOVE) ×2 IMPLANT
GLOVE BIOGEL PI IND STRL 6.5 (GLOVE) ×1 IMPLANT
GLOVE BIOGEL PI IND STRL 8 (GLOVE) ×1 IMPLANT
GLOVE BIOGEL PI INDICATOR 6.5 (GLOVE) ×1
GLOVE BIOGEL PI INDICATOR 8 (GLOVE) ×1
GLOVE ECLIPSE 8.0 STRL XLNG CF (GLOVE) ×2 IMPLANT
GLOVE SURG SYN 7.5  E (GLOVE) ×2
GLOVE SURG SYN 7.5 E (GLOVE) ×1 IMPLANT
GLOVE SURG SYN 7.5 PF PI (GLOVE) ×1 IMPLANT
GOWN STRL REUS W/ TWL LRG LVL3 (GOWN DISPOSABLE) ×2 IMPLANT
GOWN STRL REUS W/ TWL XL LVL3 (GOWN DISPOSABLE) ×1 IMPLANT
GOWN STRL REUS W/TWL LRG LVL3 (GOWN DISPOSABLE) ×4
GOWN STRL REUS W/TWL XL LVL3 (GOWN DISPOSABLE) ×4 IMPLANT
KIT SHOULDER STAB MARCO (KITS) ×2 IMPLANT
KIT STR SPEAR 1.8 FBRTK DISP (KITS) IMPLANT
LASSO 90 CVE QUICKPAS (DISPOSABLE) IMPLANT
LASSO CRESCENT QUICKPASS (SUTURE) IMPLANT
LOOP 2 FIBERLINK CLOSED (SUTURE) ×1 IMPLANT
MANIFOLD NEPTUNE II (INSTRUMENTS) ×2 IMPLANT
NDL SAFETY ECLIPSE 18X1.5 (NEEDLE) ×1 IMPLANT
NDL SCORPION MULTI FIRE (NEEDLE) IMPLANT
NEEDLE HYPO 18GX1.5 SHARP (NEEDLE) ×2
NEEDLE SCORPION MULTI FIRE (NEEDLE) ×2 IMPLANT
PACK ARTHROSCOPY DSU (CUSTOM PROCEDURE TRAY) ×2 IMPLANT
PACK BASIN DAY SURGERY FS (CUSTOM PROCEDURE TRAY) ×2 IMPLANT
PAD COLD SHLDR WRAP-ON (PAD) ×2 IMPLANT
PENCIL SMOKE EVACUATOR (MISCELLANEOUS) IMPLANT
PORT APPOLLO RF 90DEGREE MULTI (SURGICAL WAND) ×2 IMPLANT
RESTRAINT HEAD UNIVERSAL NS (MISCELLANEOUS) ×2 IMPLANT
SHEET MEDIUM DRAPE 40X70 STRL (DRAPES) IMPLANT
SLEEVE SCD COMPRESS KNEE MED (STOCKING) ×2 IMPLANT
SPONGE T-LAP 4X18 ~~LOC~~+RFID (SPONGE) ×2 IMPLANT
SUT ETHILON 3 0 PS 1 (SUTURE) ×2 IMPLANT
SUT FIBERWIRE #2 38 T-5 BLUE (SUTURE)
SUT MON AB 4-0 PS1 27 (SUTURE) IMPLANT
SUT PDS AB 1 CT  36 (SUTURE)
SUT PDS AB 1 CT 36 (SUTURE) IMPLANT
SUT TIGER TAPE 7 IN WHITE (SUTURE) IMPLANT
SUT VIC AB 0 CT1 27 (SUTURE)
SUT VIC AB 0 CT1 27XBRD ANBCTR (SUTURE) IMPLANT
SUT VIC AB 2-0 SH 27 (SUTURE)
SUT VIC AB 2-0 SH 27XBRD (SUTURE) IMPLANT
SUTURE FIBERWR #2 38 T-5 BLUE (SUTURE) IMPLANT
SUTURE TAPE 1.3 40 TPR END (SUTURE) IMPLANT
SUTURE TAPE TIGERLINK 1.3MM BL (SUTURE) IMPLANT
SUTURETAPE 1.3 40 TPR END (SUTURE)
SUTURETAPE TIGERLINK 1.3MM BL (SUTURE) ×2
SYR 5ML LL (SYRINGE) ×2 IMPLANT
TAPE FIBER 2MM 7IN #2 BLUE (SUTURE) IMPLANT
TOWEL GREEN STERILE FF (TOWEL DISPOSABLE) ×4 IMPLANT
TUBE CONNECTING 20X1/4 (TUBING) ×2 IMPLANT
TUBING ARTHROSCOPY IRRIG 16FT (MISCELLANEOUS) ×2 IMPLANT
YANKAUER SUCT BULB TIP NO VENT (SUCTIONS) IMPLANT

## 2022-07-15 NOTE — Op Note (Signed)
Date of Surgery: 07/15/2022  INDICATIONS:  Ms. Tammy Calderon is a 56 y.o.-year-old female with right shoulder acute rotator cuff tear and biceps tendinitis that has been recalcitrant to nonoperative management.  She is ultimately elected for surgical management.  The risk and benefits of the procedure were discussed in detail and documented in the pre-operative evaluation.   PREOPERATIVE DIAGNOSES: Right shoulder, acute traumatic rotator cuff tear, biceps tendinitis, and subacromial impingement.  POSTOPERATIVE DIAGNOSIS: Same.  PROCEDURE: Open subpectoral biceps tenodesis - 54982 Arthroscopic limited debridement - 64158 Arthroscopic subacromial decompression - 30940 Arthroscopic rotator cuff repair - 76808  SURGEON: Yevonne Pax MD  ASSISTANT: Raynelle Fanning, ATC  ANESTHESIA:  general plus interscalene nerve block  IV FLUIDS AND URINE: See anesthesia record.  ANTIBIOTICS: Ancef  ESTIMATED BLOOD LOSS: 5 mL.  IMPLANTS:  Implant Name Type Inv. Item Serial No. Manufacturer Lot No. LRB No. Used Action  Haywood Filler BIO 4.75X19.1 - [id number] Anchor ANCHOR SWIVELOCK BIO 4.75X19.1  [employer] [id number] Right 1 Implanted  ANCHOR SUT FBRTK 2.6X1.7X2 - [id number] Anchor ANCHOR SUT FBRTK 2.6X1.7X2  [employer] [id number] Right 1 Implanted  ANCHOR SUT FBRTK 2.6 SP1.7 TAP - [id number] Anchor ANCHOR SUT FBRTK 2.6 SP1.7 TAP  [employer] [id number] Right 1 Implanted  [facility] BIO 4.75X19.1 - [id number] Anchor ANCHOR SWIVELOCK BIO 4.75X19.1  [employer] [id number] Right 1 Implanted    DRAINS: None  CULTURES: None  COMPLICATIONS: none  PROCEDURE:    OPERATIVE FINDING: Exam under anesthesia:   Examination under anesthesia revealed forward elevation of 160 degrees.  With the arm at the side, there was 65 degrees of external rotation.  There is a 1+ anterior load shift and a 1+ posterior load shift.    Arthroscopic findings demonstrated: Articular space: Significant arthrofibrosis in the  rotator interval Chondral surfaces: Normal Biceps: Significant inflammation and injection with intrasubstance tearing Subscapularis: Intact Supraspinatus: Near full-thickness bursal sided tear near the anterior cable Infraspinatus: Intact    I identified the patient in the pre-operative holding area.  I marked the operative right shoulder with my initials. I reviewed the risks and benefits of the proposed surgical intervention and the patient wished to proceed.  Anesthesia was then performed with regional block.  The patient was transferred to the operative suite and placed in the beach chair position with all bony prominences padded.     SCDs were placed on bilateral lower extremity. Appropriate antibiotics was administered within 1 hour before incision.  Anesthesia was induced.  The operative extremity was then prepped and draped in standard fashion. A time out was performed confirming the correct extremity, correct patient and correct procedure.   The arthroscope was introduced in the glenohumeral joint from a posterior portal.  An anterior portal was created.  The shoulder was examined and the above findings were noted.     With an arthroscopic shaver and a wand ablator, synovitis throughout the  shoulder was resected.  The arthroscopic shaver was used to excise torn portions of the labrum back to a stable margin. Specifically this was done for the anterior superior and posterior labrum.  Electrocautery was used to ablate tissue in the rotator interval as there was significant scarring here.  2 fiber links were ultimately passed and then wrapped around the biceps using the scorpion suture passer.  This was done to create a ripstop in both. This was done through the anterior portal.  At this time anatomic groove of the biceps was identified and this was prepped with  an awl with subsequent placement of a 4.75 mm swivel lock of these 2 sutures.  This resulted in excellent approximation of the  biceps.   The rotator cuff was then approached through the subacromial space. Anterior, lateral, and posterior portals were used.  Bursectomy was performed with an arthroscopic shaver and [employer] wand.  The soft tissues on the undersurface of the acromion and clavicle were resected with the arthroscopic shaver and wand.  There was a near full-thickness tear involving the anterior cable of the rotator cuff.  [place] was introduced into this and essentially was able to go intra-articular.  There was good excursion that was noted of the tendon back to its footprint which would be amendable to an repair.  The greater tuberosity was prepared with an arthroscopic shaver in order to create a bleeding surface for healing.   The rotator cuff was repaired with a double row configuration with 1 medial row all suture anchor as noted above with sutures passed from anterior to posterior in horizontal fashion with a self passing suture device. The sutures were placed with a single lateral row anchor.  This provided excellent anatomic footprint approximation.   The shoulder was irrigated.  The arthroscopic instruments were removed.  Wounds were closed with 3-0 nylon sutures.  A sterile dressing was applied with xeroform, 4x8s, abdominal pad, and tape. An Gar Gibbon was placed and the upper extremity was placed in a shoulder immobilizer.  The patient tolerated the procedure well and was taken to the recovery room in stable condition.  All counts were correct in the case. The patient tolerated the procedure well and was taken to the recovery room in stable condition.    POSTOPERATIVE PLAN: She has physical therapy scheduled closer to her home.  They will be provided with my rotator cuff rehab protocol.  She will be placed in a sling.  I will see her back in 2 weeks for suture removal.  She will be placed on aspirin for anticoagulation  Yevonne Pax, MD 11:43 AM

## 2022-07-15 NOTE — Transfer of Care (Signed)
Immediate Anesthesia Transfer of Care Note  Patient: Tammy Calderon  Procedure(s) Performed: RIGHT SHOULDER ARTHROSCOPY WITH ROTATOR CUFF REPAIR AND OPEN BICEPS TENODESIS (Right)  Patient Location: PACU  Anesthesia Type:GA combined with regional for post-op pain  Level of Consciousness: drowsy  Airway & Oxygen Therapy: Patient Spontanous Breathing and Patient connected to face mask oxygen  Post-op Assessment: Report given to RN and Post -op Vital signs reviewed and stable  Post vital signs: Reviewed and stable  Last Vitals:   RR: 19 Vitals Value Taken Time  BP 112/69 07/15/22 1133  Temp    Pulse 75 07/15/22 1134  Resp    SpO2 97 % 07/15/22 1134  Vitals shown include unvalidated device data.  Last Pain:  Vitals:   07/15/22 0740  TempSrc: Oral  PainSc: 0-No pain      Patients Stated Pain Goal: 4 (74/[id number]/49 [id number])  Complications: No notable events documented.

## 2022-07-15 NOTE — Anesthesia Procedure Notes (Signed)
Procedure Name: Intubation Date/Time: 07/15/2022 10:04 AM  Performed by: Lavonia Dana, CRNAPre-anesthesia Checklist: Patient identified, Emergency Drugs available, Suction available and Patient being monitored Patient Re-evaluated:Patient Re-evaluated prior to induction Oxygen Delivery Method: Circle system utilized Preoxygenation: Pre-oxygenation with 100% oxygen Induction Type: IV induction Ventilation: Mask ventilation without difficulty Laryngoscope Size: Mac and 3 Grade View: Grade I Tube type: Oral Tube size: 7.0 mm Number of attempts: 1 Airway Equipment and Method: Stylet and Bite block Placement Confirmation: ETT inserted through vocal cords under direct vision, positive ETCO2 and breath sounds checked- equal and bilateral Secured at: 21 cm Tube secured with: Tape Dental Injury: Teeth and Oropharynx as per pre-operative assessment

## 2022-07-15 NOTE — Anesthesia Postprocedure Evaluation (Signed)
Anesthesia Post Note  Patient: Tammy Calderon  Procedure(s) Performed: RIGHT SHOULDER ARTHROSCOPY WITH ROTATOR CUFF REPAIR AND OPEN BICEPS TENODESIS (Right)     Patient location during evaluation: PACU Anesthesia Type: General Level of consciousness: awake and alert Pain management: pain level controlled Vital Signs Assessment: post-procedure vital signs reviewed and stable Respiratory status: spontaneous breathing, nonlabored ventilation and respiratory function stable Cardiovascular status: blood pressure returned to baseline Postop Assessment: no apparent nausea or vomiting Anesthetic complications: no   No notable events documented.  Last Vitals:  Vitals:   07/15/22 1200 07/15/22 1215  BP: 109/60 113/65  Pulse: 66 68  Resp: 18 19  Temp:    SpO2: 99% 90%    Last Pain:  Vitals:   07/15/22 1200  TempSrc:   PainSc: 0-No pain                 Marthenia Rolling

## 2022-07-15 NOTE — H&P (Signed)
Chief Complaint: Right shoulder pain        History of Present Illness:    06/04/2022: Presents today for follow-up of her right shoulder as well as right fibular fracture.  Overall she continues to feel much better with regard to her walking and lower extremity pain.  That being said her right shoulder remains very symptomatic.  At the last visit an intra-articular ultrasound-guided injection was performed and she did not get any relief from this if anything she states that she felt worse for a week.  She remains very disabled and is not able to perform basic hygiene or do her hair.  She is very concerned about the right shoulder.  She has been working on a home shoulder program which provided significant improvement. She did also receive an initial subacromial injection following her shoulder injury during the fall which also provided only temporary relief.     Tammy Calderon is a 56 y.o. female ambidextrous female presents with right lower leg pain as well as right shoulder pain after a fall on her way to church.  She landed directly on the right side.  She states that she has not previously have any issues with the right leg.  Since that time she has been having difficulty placing weight directly on the right leg.  She was brought to the emergency room and found to have a proximal third fibular shaft fracture.  She was given a knee immobilizer and made nonweightbearing on crutches.  She has had a very difficult time with crutches as result of her right shoulder.  She has previously been seen by an orthopedic surgeon for the right shoulder who has been treating her rotator cuff with an injection.  She did not get any relief from the injection.  Unfortunately since the fall she now has extremely limited range of motion.  She is not able to forward elevate her arm since the injury.  She is very concerned about this as she has been very dependent on her arms as a result of the injury to the lower  leg.  She has been using crutches which is quite difficult.  Her husband is also helping her significantly to mobilize.  She has recently unfortunately been treated for both breast cancer and ovarian cancer requiring surgery.       Surgical History:   None   PMH/PSH/Family History/Social History/Meds/Allergies:         Past Medical History:  Diagnosis Date   Anemia      past   Asthma      uses albuterol approx 1-2x a week   Bell's palsy      resolved   Breast cancer (HCC)     Fibromyalgia     GERD (gastroesophageal reflux disease)     Hypertension     PONV (postoperative nausea and vomiting)           Past Surgical History:  Procedure Laterality Date   BREAST BIOPSY WITH RADIO FREQUENCY LOCALIZER Left 12/27/2021    Procedure: EXCISIONAL BREAST BIOPSY WITH RADIO FREQUENCY LOCALIZER X2;  Surgeon: Virl Cagey, MD;  Location: [facility];  Service: General;  Laterality: Left;   LYMPH NODE DISSECTION N/A 03/19/2022    Procedure: LYMPH NODE DISSECTION;  Surgeon: [name], MD;  Location: [facility];  Service: Gynecology;  Laterality: N/A;   NASAL SEPTUM SURGERY       ROBOTIC ASSISTED TOTAL HYSTERECTOMY WITH BILATERAL SALPINGO OOPHERECTOMY Bilateral 03/19/2022  Procedure: XI ROBOTIC ASSISTED TOTAL HYSTERECTOMY WITH BILATERAL SALPINGO OOPHORECTOMY;  Surgeon: [name], MD;  Location: [facility];  Service: Gynecology;  Laterality: Bilateral;   SENTINEL NODE BIOPSY N/A 03/19/2022    Procedure: SENTINEL NODE BIOPSY;  Surgeon: [name], MD;  Location: [facility];  Service: Gynecology;  Laterality: N/A;   TUBAL LIGATION       WISDOM TOOTH EXTRACTION        Social History         Socioeconomic History   Marital status: Married      Spouse name: Not on file   Number of children: Not on file   Years of education: Not on file   Highest education level: Not on file  Occupational History   Not on file  Tobacco Use   Smoking status: Never   Smokeless tobacco: Never   Vaping Use   Vaping Use: Never used  Substance and Sexual Activity   Alcohol use: No   Drug use: Never   Sexual activity: Yes      Birth control/protection: Post-menopausal  Other Topics Concern   Not on file  Social History Narrative   Not on file    Social Determinants of Health    Financial Resource Strain: Not on file  Food Insecurity: Not on file  Transportation Needs: Not on file  Physical Activity: Not on file  Stress: Not on file  Social Connections: Not on file         Family History  Adopted: Yes  Problem Relation Age of Onset   Uterine cancer Mother           Allergies  Allergen Reactions   Misc. Sulfonamide Containing Compounds Anaphylaxis   Sulfa Antibiotics Anaphylaxis   Cymbalta [Duloxetine Hcl]        sedated   Lyrica [Pregabalin]        drowsy   Neurontin [Gabapentin]        drowsy          Current Outpatient Medications  Medication Sig Dispense Refill   albuterol (VENTOLIN HFA) 108 (90 Base) MCG/ACT inhaler Inhale 2 puffs into the lungs every 6 (six) hours as needed for wheezing. 1 each 2   anastrozole (ARIMIDEX) 1 MG tablet Take 1 tablet (1 mg total) by mouth daily. 30 tablet 6   ergocalciferol (VITAMIN D2) 1.25 MG (50000 UT) capsule Take 1 capsule (50,000 Units total) by mouth once a week. 12 capsule 1   escitalopram (LEXAPRO) 10 MG tablet Take 1 tablet (10 mg total) by mouth daily. 30 tablet 3   famotidine (PEPCID) 40 MG tablet Take 1 tablet (40 mg total) by mouth daily. 30 tablet 3   gabapentin (NEURONTIN) 100 MG capsule Take 1 capsule (100 mg total) by mouth at bedtime. 30 capsule 3   HYDROcodone-acetaminophen (NORCO/VICODIN) 5-325 MG tablet Take 1 tablet by mouth 3 (three) times daily as needed for severe pain (use sparingly). 15 tablet 0   ibuprofen (ADVIL) 800 MG tablet Take 1 tablet (800 mg total) by mouth every 8 (eight) hours as needed for moderate pain. 30 tablet 0   lisinopril-hydrochlorothiazide (ZESTORETIC) 20-12.5 MG tablet TAKE 1  TABLET BY MOUTH ONCE DAILY . APPOINTMENT REQUIRED FOR FUTURE REFILLS 30 tablet 1   ondansetron (ZOFRAN) 4 MG tablet Take 1 tablet (4 mg total) by mouth every 8 (eight) hours as needed. 30 tablet 1   senna-docusate (SENOKOT-S) 8.6-50 MG tablet Take 2 tablets by mouth at bedtime. For AFTER surgery, do  not take if having diarrhea 30 tablet 0   traMADol (ULTRAM) 50 MG tablet Take 1 tablet (50 mg total) by mouth every 6 (six) hours as needed. 30 tablet 0    No current facility-administered medications for this visit.    Imaging Results (Last 48 hours)  No results found.     Review of Systems:   A ROS was performed including pertinent positives and negatives as documented in the HPI.   Physical Exam :   Constitutional: NAD and appears stated age Neurological: Alert and oriented Psych: Appropriate affect and cooperative Last menstrual period 10/06/2011.    Comprehensive Musculoskeletal Exam:     Musculoskeletal Exam      Inspection Right Left  Skin No atrophy or winging No atrophy or winging  Palpation      Tenderness Glenohumeral lateral deltoid None  Range of Motion      Flexion (passive) 130 170  Flexion (active) 30 170  Abduction 20 170  ER at the side 10 70  Can reach behind back to T12 T12  Strength        4-5 supraspinatus negative belly press Full  Special Tests      Pseudoparalytic No No  Neurologic      Fires PIN, radial, median, ulnar, musculocutaneous, axillary, suprascapular, long thoracic, and spinal accessory innervated muscles. No abnormal sensibility  Vascular/Lymphatic      Radial Pulse 2+ 2+  Cervical Exam      Patient has symmetric cervical range of motion with negative Spurling's test.  Special Test: Positive Speed's Test with flexion of the elbow      She has tenderness palpation about the midshaft fibula.  There is minimal tenderness with a stress external rotation of the right ankle.     Imaging:   Xray (2 views tib-fib, 1 view ankle external  rotation stress view): Proximal fibular shaft fracture with some interval callus formation   MRI right shoulder: She does have a small full-thickness tear involving the anterior supraspinatus tendon with some shoulder capsular thickening   I personally reviewed and interpreted the radiographs.     Assessment:   56 y.o. female with a right proximal fibula fracture which is overall recovering well.  She does also have a right full-thickness rotator cuff tear which has remained very symptomatic.  Overall I have counseled her that given the fact that she did not experience any relief from an ultrasound-guided injection of the glenohumeral joint I do believe that it is less likely that she is suffering from adhesive capsulitis as opposed to a very symptomatic rotator cuff tear.  She did get some initial relief from a subacromial injection although was transient. Given the fact that this is a traumatic injury after a fall 1 month prior where she landed directly on the side I do believe that surgical reconstruction would be an option.  Specifically we discussed treatment options including a formal structured physical therapy plan versus acute rotator cuff repair.  At this time she has gotten little to no relief from her injection and as result I do not believe that she would be able to progress very well with physical therapy. She has been working on a guided home program which she has not been able to initiate well as any active range of motion is causing severe pain.  We did also discuss the risk of tear progression and given the fact that this was an acute traumatic injury I do believe that there is a role  for acute surgical treatment.  After discussion of the risks and benefits she has elected for right shoulder rotator cuff repair.  We also plan for right shoulder biceps tenodesis. Plan :     -Plan for right shoulder arthroscopy with rotator cuff repair and biceps tenodesis       After a lengthy  discussion of treatment options, including risks, benefits, alternatives, complications of surgical and nonsurgical conservative options, the patient elected surgical repair.    The patient  is aware of the material risks  and complications including, but not limited to injury to adjacent structures, neurovascular injury, infection, numbness, bleeding, implant failure, thermal burns, stiffness, persistent pain, failure to heal, disease transmission from allograft, need for further surgery, dislocation, anesthetic risks, blood clots, risks of death,and others. The probabilities of surgical success and failure discussed with patient given their particular co-morbidities.The time and nature of expected rehabilitation and recovery was discussed.The patient's questions were all answered preoperatively.  No barriers to understanding were noted. I explained the natural history of the disease process and Rx rationale.  I explained to the patient what I considered to be reasonable expectations given their personal situation.  The final treatment plan was arrived at through a shared patient decision making process model.               I personally saw and evaluated the patient, and participated in the management and treatment plan.   [name], MD Attending Physician, Orthopedic Surgery

## 2022-07-15 NOTE — Progress Notes (Signed)
Assisted Dr. Howze with right, interscalene , ultrasound guided block. Side rails up, monitors on throughout procedure. See vital signs in flow sheet. Tolerated Procedure well. 

## 2022-07-15 NOTE — Anesthesia Procedure Notes (Signed)
Anesthesia Regional Block: Interscalene brachial plexus block   Pre-Anesthetic Checklist: , timeout performed,  Correct Patient, Correct Site, Correct Laterality,  Correct Procedure, Correct Position, site marked,  Risks and benefits discussed,  Pre-op evaluation,  At surgeon's request and post-op pain management  Laterality: Right  Prep: Maximum Sterile Barrier Precautions used, chloraprep       Needles:  Injection technique: Single-shot  Needle Type: Echogenic Stimulator Needle     Needle Length: 9cm  Needle Gauge: 22     Additional Needles:   Procedures:,,,, ultrasound used (permanent image in chart),,    Narrative:  Start time: 07/15/2022 8:30 AM End time: 07/15/2022 8:33 AM Injection made incrementally with aspirations every 5 mL.  Performed by: Personally  Anesthesiologist: Brennan Bailey, MD  Additional Notes: Risks, benefits, and alternative discussed. Patient gave consent for procedure. Patient prepped and draped in sterile fashion. Sedation administered, patient remains easily responsive to voice. Relevant anatomy identified with ultrasound guidance. Local anesthetic given in 5cc increments with no signs or symptoms of intravascular injection. No pain or paraesthesias with injection. Patient monitored throughout procedure with signs of LAST or immediate complications. Tolerated well. Ultrasound image placed in chart.  Tawny Asal, MD

## 2022-07-15 NOTE — Brief Op Note (Signed)
   Brief Op Note  Date of Surgery: 07/15/2022  Preoperative Diagnosis: FULL THICKNESS RIGHT ROTATOR CUFF TEAR  Postoperative Diagnosis: same  Procedure: Procedure(s): RIGHT SHOULDER ARTHROSCOPY WITH ROTATOR CUFF REPAIR AND OPEN BICEPS TENODESIS  Implants: Implant Name Type Inv. Item Serial No. Manufacturer Lot No. LRB No. Used Action  [place] Filler BIO 4.75X19.1 - [id number] Anchor ANCHOR SWIVELOCK BIO 4.75X19.1  [employer] [id number] Right 1 Implanted  ANCHOR SUT FBRTK 2.6X1.7X2 - [id number] Anchor ANCHOR SUT FBRTK 2.6X1.7X2  [employer] [id number] Right 1 Implanted  ANCHOR SUT FBRTK 2.6 SP1.7 TAP - [id number] Anchor ANCHOR SUT FBRTK 2.6 SP1.7 TAP  [employer] [id number] Right 1 Implanted  [place] BIO 4.75X19.1 - [id number] Anchor ANCHOR SWIVELOCK BIO 4.75X19.1  Rolinda Roan [id number] Right 1 Implanted    Surgeons: Surgeon(s): [name], MD  Anesthesia: Regional    Estimated Blood Loss: See anesthesia record  Complications: None  Condition to PACU: Stable  Yevonne Pax, MD 07/15/2022 11:30 AM

## 2022-07-15 NOTE — Interval H&P Note (Signed)
History and Physical Interval Note:  07/15/2022 9:43 AM  Tammy Calderon  has presented today for surgery, with the diagnosis of FULL THICKNESS RIGHT ROTATOR CUFF TEAR.  The various methods of treatment have been discussed with the patient and family. After consideration of risks, benefits and other options for treatment, the patient has consented to  Procedure(s): RIGHT SHOULDER ARTHROSCOPY WITH ROTATOR CUFF REPAIR AND OPEN BICEPS TENODESIS (Right) as a surgical intervention.  The patient's history has been reviewed, patient examined, no change in status, stable for surgery.  I have reviewed the patient's chart and labs.  Questions were answered to the patient's satisfaction.     [name]

## 2022-07-15 NOTE — Discharge Instructions (Addendum)
Discharge Instructions    Attending Surgeon: [name], MD Office Phone Number: [phone]   Diagnosis and Procedures:    Surgeries Performed: Right shoulder rotator cuff repair  Discharge Plan:    Diet: Resume usual diet. Begin with light or bland foods.  Drink plenty of fluids.  Activity:  Keep sling and dressing in place until your follow up visit in Physical Therapy You are advised to go home directly from the hospital or surgical center. Restrict your activities.  GENERAL INSTRUCTIONS: 1.  Keep your surgical site elevated above your heart for at least 5-7 days or longer to prevent swelling. This will improve your comfort and your overall recovery following surgery.     2. Please call Dr. Eddie Dibbles office at [phone] with questions Monday-Friday during business hours. If no one answers, please leave a message and someone should get back to the patient within 24 hours. For emergencies please call 911 or proceed to the emergency room.   3. Patient to notify surgical team if experiences any of the following: Bowel/Bladder dysfunction, uncontrolled pain, nerve/muscle weakness, incision with increased drainage or redness, nausea/vomiting and Fever greater than 101.0 F.  Be alert for signs of infection including redness, streaking, odor, fever or chills. Be alert for excessive pain or bleeding and notify your surgeon immediately.  WOUND INSTRUCTIONS:   Leave your dressing/cast/splint in place until your post operative visit.  Keep it clean and dry.  Always keep the incision clean and dry until the staples/sutures are removed. If there is no drainage from the incision you should keep it open to air. If there is drainage from the incision you must keep it covered at all times until the drainage stops  Do not soak in a bath tub, hot tub, pool, lake or other body of water until 21 days after your surgery and your incision is completely dry and healed.  If you have  removable sutures (or staples) they must be removed 10-14 days (unless otherwise instructed) from the day of your surgery.     1)  Elevate the extremity as much as possible.  2)  Keep the dressing clean and dry.  3)  Please call us if the dressing becomes wet or dirty.  4)  If you are experiencing worsening pain or worsening swelling, please call.     MEDICATIONS: Resume all previous home medications at the previous prescribed dose and frequency unless otherwise noted Start taking the  pain medications on an as-needed basis as prescribed  Please taper down pain medication over the next week following surgery.  Ideally you should not require a refill of any narcotic pain medication.  Take pain medication with food to minimize nausea. In addition to the prescribed pain medication, you may take over-the-counter pain relievers such as Tylenol.  Do NOT take additional tylenol if your pain medication already has tylenol in it.  Aspirin '325mg'$  daily for four weeks.      FOLLOWUP INSTRUCTIONS: 1. Follow up at the Physical Therapy Clinic 3-4 days following surgery. This appointment should be scheduled unless other arrangements have been made.The Physical Therapy scheduling number is [phone] if an appointment has not already been arranged.  2. Contact Dr. Eddie Dibbles office during office hours at [phone] or the practice after hours line at [phone] for non-emergencies. For medical emergencies call 911.   Discharge Location: Home    No Tylenol until 1:45pm   Post Anesthesia Home Care Instructions  Activity: Get plenty of rest  for the remainder of the day. A responsible individual must stay with you for 24 hours following the procedure.  For the next 24 hours, DO NOT: -Drive a car -Paediatric nurse -Drink alcoholic beverages -Take any medication unless instructed by your physician -Make any legal decisions or sign important papers.  Meals: Start with liquid foods such as  gelatin or soup. Progress to regular foods as tolerated. Avoid greasy, spicy, heavy foods. If nausea and/or vomiting occur, drink only clear liquids until the nausea and/or vomiting subsides. Call your physician if vomiting continues.  Special Instructions/Symptoms: Your throat may feel dry or sore from the anesthesia or the breathing tube placed in your throat during surgery. If this causes discomfort, gargle with warm salt water. The discomfort should disappear within 24 hours.  If you had a scopolamine patch placed behind your ear for the management of post- operative nausea and/or vomiting:  1. The medication in the patch is effective for 72 hours, after which it should be removed.  Wrap patch in a tissue and discard in the trash. Wash hands thoroughly with soap and water. 2. You may remove the patch earlier than 72 hours if you experience unpleasant side effects which may include dry mouth, dizziness or visual disturbances. 3. Avoid touching the patch. Wash your hands with soap and water after contact with the patch.    Regional Anesthesia Blocks  1. Numbness or the inability to move the "blocked" extremity may last from 3-48 hours after placement. The length of time depends on the medication injected and your individual response to the medication. If the numbness is not going away after 48 hours, call your surgeon.  2. The extremity that is blocked will need to be protected until the numbness is gone and the  Strength has returned. Because you cannot feel it, you will need to take extra care to avoid injury. Because it may be weak, you may have difficulty moving it or using it. You may not know what position it is in without looking at it while the block is in effect.  3. For blocks in the legs and feet, returning to weight bearing and walking needs to be done carefully. You will need to wait until the numbness is entirely gone and the strength has returned. You should be able to move your leg  and foot normally before you try and bear weight or walk. You will need someone to be with you when you first try to ensure you do not fall and possibly risk injury.  4. Bruising and tenderness at the needle site are common side effects and will resolve in a few days.  5. Persistent numbness or new problems with movement should be communicated to the surgeon or the [place] [phone] [place] [phone]). Information for Discharge Teaching: EXPAREL (bupivacaine liposome injectable suspension)   Your surgeon or anesthesiologist gave you EXPAREL(bupivacaine) to help control your pain after surgery.  EXPAREL is a local anesthetic that provides pain relief by numbing the tissue around the surgical site. EXPAREL is designed to release pain medication over time and can control pain for up to 72 hours. Depending on how you respond to EXPAREL, you may require less pain medication during your recovery.  Possible side effects: Temporary loss of sensation or ability to move in the area where bupivacaine was injected. Nausea, vomiting, constipation Rarely, numbness and tingling in your mouth or lips, lightheadedness, or anxiety may occur. Call your doctor right away if  you think you may be experiencing any of these sensations, or if you have other questions regarding possible side effects.  Follow all other discharge instructions given to you by your surgeon or nurse. Eat a healthy diet and drink plenty of water or other fluids.  If you return to the hospital for any reason within 96 hours following the administration of EXPAREL, it is important for health care providers to know that you have received this anesthetic. A teal colored band has been placed on your arm with the date, time and amount of EXPAREL you have received in order to alert and inform your health care providers. Please leave this armband in place for the full 96 hours following administration, and  then you may remove the band.

## 2022-07-16 ENCOUNTER — Encounter (HOSPITAL_BASED_OUTPATIENT_CLINIC_OR_DEPARTMENT_OTHER): Payer: Self-pay | Admitting: Orthopaedic Surgery

## 2022-07-18 ENCOUNTER — Ambulatory Visit (HOSPITAL_COMMUNITY): Payer: 59

## 2022-07-21 ENCOUNTER — Ambulatory Visit (HOSPITAL_COMMUNITY): Payer: 59

## 2022-07-23 ENCOUNTER — Encounter (HOSPITAL_COMMUNITY): Payer: 59

## 2022-07-29 ENCOUNTER — Encounter (HOSPITAL_COMMUNITY): Payer: 59

## 2022-07-30 ENCOUNTER — Ambulatory Visit (INDEPENDENT_AMBULATORY_CARE_PROVIDER_SITE_OTHER): Payer: 59 | Admitting: Orthopaedic Surgery

## 2022-07-30 DIAGNOSIS — S46011A Strain of muscle(s) and tendon(s) of the rotator cuff of right shoulder, initial encounter: Secondary | ICD-10-CM

## 2022-07-30 NOTE — Progress Notes (Signed)
Post Operative Evaluation    Procedure/Date of Surgery: rotator cuff repair 7/25  Interval History:    Presents today for 2-week follow-up status post the above procedure.  Unfortunately her husband has recently been admitted to [facility] hospital for a significant infection in the setting of diabetes.  To that effect she has been at the bedside and not able to attend physical therapy.  She has been compliant with anticoagulation.  She is here today for further assessment.   PMH/PSH/Family History/Social History/Meds/Allergies:    Past Medical History:  Diagnosis Date   Anemia    past   Asthma    uses albuterol approx 1-2x a week   Bell's palsy    resolved   Breast cancer (HCC)    Fibromyalgia    GERD (gastroesophageal reflux disease)    Hypertension    PONV (postoperative nausea and vomiting)    Past Surgical History:  Procedure Laterality Date   ABDOMINAL HYSTERECTOMY     BREAST BIOPSY WITH RADIO FREQUENCY LOCALIZER Left 12/27/2021   Procedure: EXCISIONAL BREAST BIOPSY WITH RADIO FREQUENCY LOCALIZER X2;  Surgeon: Virl Cagey, MD;  Location: [facility];  Service: General;  Laterality: Left;   LYMPH NODE DISSECTION N/A 03/19/2022   Procedure: LYMPH NODE DISSECTION;  Surgeon: [name], MD;  Location: [facility];  Service: Gynecology;  Laterality: N/A;   NASAL SEPTUM SURGERY     ROBOTIC ASSISTED TOTAL HYSTERECTOMY WITH BILATERAL SALPINGO OOPHERECTOMY Bilateral 03/19/2022   Procedure: XI ROBOTIC ASSISTED TOTAL HYSTERECTOMY WITH BILATERAL SALPINGO OOPHORECTOMY;  Surgeon: [name], MD;  Location: [facility];  Service: Gynecology;  Laterality: Bilateral;   SENTINEL NODE BIOPSY N/A 03/19/2022   Procedure: SENTINEL NODE BIOPSY;  Surgeon: [name], MD;  Location: [facility];  Service: Gynecology;  Laterality: N/A;   SHOULDER ARTHROSCOPY WITH ROTATOR CUFF REPAIR AND OPEN BICEPS TENODESIS Right 07/15/2022   Procedure: RIGHT  SHOULDER ARTHROSCOPY WITH ROTATOR CUFF REPAIR AND OPEN BICEPS TENODESIS;  Surgeon: [name], MD;  Location: [facility];  Service: Orthopedics;  Laterality: Right;   TUBAL LIGATION     WISDOM TOOTH EXTRACTION     Social History   Socioeconomic History   Marital status: Married    Spouse name: Not on file   Number of children: Not on file   Years of education: Not on file   Highest education level: Not on file  Occupational History   Not on file  Tobacco Use   Smoking status: Never   Smokeless tobacco: Never  Vaping Use   Vaping Use: Never used  Substance and Sexual Activity   Alcohol use: No   Drug use: Never   Sexual activity: Yes    Birth control/protection: Post-menopausal  Other Topics Concern   Not on file  Social History Narrative   Not on file   Social Determinants of Health   Financial Resource Strain: Not on file  Food Insecurity: Not on file  Transportation Needs: Not on file  Physical Activity: Not on file  Stress: Not on file  Social Connections: Not on file   Family History  Adopted: Yes  Problem Relation Age of Onset   Uterine cancer Mother    Allergies  Allergen Reactions   Misc. Sulfonamide Containing Compounds Anaphylaxis   Sulfa Antibiotics Anaphylaxis  Cymbalta [Duloxetine Hcl]     sedated   Lyrica [Pregabalin]     drowsy   Neurontin [Gabapentin]     drowsy   Current Outpatient Medications  Medication Sig Dispense Refill   albuterol (VENTOLIN HFA) 108 (90 Base) MCG/ACT inhaler Inhale 2 puffs into the lungs every 6 (six) hours as needed for wheezing. 1 each 2   anastrozole (ARIMIDEX) 1 MG tablet Take 1 tablet (1 mg total) by mouth daily. 30 tablet 6   aspirin EC 325 MG tablet Take 1 tablet (325 mg total) by mouth daily. (Patient not taking: Reported on 07/03/2022) 30 tablet 0   ergocalciferol (VITAMIN D2) 1.25 MG (50000 UT) capsule Take 1 capsule (50,000 Units total) by mouth once a week. 12 capsule 1   escitalopram  (LEXAPRO) 10 MG tablet Take 1 tablet (10 mg total) by mouth daily. 30 tablet 3   famotidine (PEPCID) 40 MG tablet Take 1 tablet (40 mg total) by mouth daily. 30 tablet 3   gabapentin (NEURONTIN) 100 MG capsule Take 1 capsule (100 mg total) by mouth at bedtime. (Patient not taking: Reported on 07/03/2022) 30 capsule 3   ibuprofen (ADVIL) 800 MG tablet Take 1 tablet (800 mg total) by mouth every 8 (eight) hours as needed for moderate pain. 30 tablet 0   lisinopril-hydrochlorothiazide (ZESTORETIC) 20-12.5 MG tablet TAKE 1 TABLET BY MOUTH ONCE DAILY . APPOINTMENT REQUIRED FOR FUTURE REFILLS 30 tablet 1   ondansetron (ZOFRAN) 4 MG tablet Take 1 tablet (4 mg total) by mouth every 8 (eight) hours as needed. (Patient not taking: Reported on 07/03/2022) 30 tablet 1   oxyCODONE (OXY IR/ROXICODONE) 5 MG immediate release tablet Take 1 tablet (5 mg total) by mouth every 4 (four) hours as needed (severe pain). (Patient not taking: Reported on 07/03/2022) 20 tablet 0   senna-docusate (SENOKOT-S) 8.6-50 MG tablet Take 2 tablets by mouth at bedtime. For AFTER surgery, do not take if having diarrhea 30 tablet 0   traMADol (ULTRAM) 50 MG tablet Take 1 tablet (50 mg total) by mouth every 6 (six) hours as needed. (Patient not taking: Reported on 07/03/2022) 30 tablet 0   No current facility-administered medications for this visit.   No results found.  Review of Systems:   A ROS was performed including pertinent positives and negatives as documented in the HPI.   Musculoskeletal Exam:     Portal sites are well-appearing without erythema or drainage.  Passive forward elevation is to 100 degrees without pain.  External rotation at side is to 45 degrees without pain.  Internal rotation deferred today.  Remaining distal neurosensory exam is intact with 2+ radial pulse  Imaging:    None  I personally reviewed and interpreted the radiographs.   Assessment:   56 year old female who is 2 weeks status post right  shoulder rotator cuff repair overall doing well.  At this time I have advised on the importance of postoperative physical therapy so as to not leave her with a stiff or limited shoulder.  Given the fact that she is needing to continue to our local hospital here to visit her husband I believe we can set up physical therapy so that this can be performed in [place] on her way to visit her husband.  I will plan to order this.  Plan :    -See her back in 4 weeks for reassessment      I personally saw and evaluated the patient, and participated in the management and treatment plan.  [name], MD Attending Physician, Orthopedic Surgery  This document was dictated using Dragon voice recognition software. A reasonable attempt at proof reading has been made to minimize errors.

## 2022-08-01 ENCOUNTER — Encounter (HOSPITAL_COMMUNITY): Payer: 59

## 2022-08-05 ENCOUNTER — Encounter (HOSPITAL_COMMUNITY): Payer: 59 | Admitting: Occupational Therapy

## 2022-08-06 ENCOUNTER — Other Ambulatory Visit: Payer: Self-pay

## 2022-08-06 MED ORDER — FAMOTIDINE 40 MG PO TABS: 40.0000 mg | ORAL_TABLET | Freq: Every day | ORAL | 3 refills | Status: DC

## 2022-08-08 ENCOUNTER — Other Ambulatory Visit: Payer: Self-pay

## 2022-08-08 ENCOUNTER — Encounter (HOSPITAL_COMMUNITY): Payer: 59 | Admitting: Occupational Therapy

## 2022-08-08 ENCOUNTER — Other Ambulatory Visit: Payer: Self-pay | Admitting: Family Medicine

## 2022-08-08 DIAGNOSIS — S46011A Strain of muscle(s) and tendon(s) of the rotator cuff of right shoulder, initial encounter: Secondary | ICD-10-CM

## 2022-08-12 ENCOUNTER — Encounter (HOSPITAL_COMMUNITY): Payer: 59

## 2022-08-14 ENCOUNTER — Encounter (HOSPITAL_COMMUNITY): Payer: 59

## 2022-08-19 ENCOUNTER — Ambulatory Visit: Payer: Medicaid Other | Admitting: Family Medicine

## 2022-08-20 ENCOUNTER — Encounter (HOSPITAL_COMMUNITY): Payer: 59

## 2022-08-21 ENCOUNTER — Ambulatory Visit (INDEPENDENT_AMBULATORY_CARE_PROVIDER_SITE_OTHER): Payer: 59 | Admitting: Family Medicine

## 2022-08-21 ENCOUNTER — Telehealth: Payer: Self-pay | Admitting: Family Medicine

## 2022-08-21 ENCOUNTER — Encounter: Payer: Self-pay | Admitting: Family Medicine

## 2022-08-21 VITALS — BP 132/80 | Wt 245.6 lb

## 2022-08-21 DIAGNOSIS — Z79899 Other long term (current) drug therapy: Secondary | ICD-10-CM

## 2022-08-21 DIAGNOSIS — M25511 Pain in right shoulder: Secondary | ICD-10-CM | POA: Diagnosis not present

## 2022-08-21 DIAGNOSIS — F439 Reaction to severe stress, unspecified: Secondary | ICD-10-CM

## 2022-08-21 DIAGNOSIS — K047 Periapical abscess without sinus: Secondary | ICD-10-CM | POA: Diagnosis not present

## 2022-08-21 MED ORDER — IBUPROFEN 600 MG PO TABS: 600.0000 mg | ORAL_TABLET | Freq: Three times a day (TID) | ORAL | 2 refills | Status: DC | PRN

## 2022-08-21 MED ORDER — LISINOPRIL-HYDROCHLOROTHIAZIDE 20-12.5 MG PO TABS: ORAL_TABLET | ORAL | 5 refills | Status: DC

## 2022-08-21 MED ORDER — PENICILLIN V POTASSIUM 500 MG PO TABS: 500.0000 mg | ORAL_TABLET | Freq: Three times a day (TID) | ORAL | 0 refills | Status: AC

## 2022-08-21 MED ORDER — ESCITALOPRAM OXALATE 10 MG PO TABS: 10.0000 mg | ORAL_TABLET | Freq: Every day | ORAL | 6 refills | Status: DC

## 2022-08-21 NOTE — Progress Notes (Signed)
   Subjective:    Patient ID: Tammy Calderon, female    DOB: 1966-07-17, 56 y.o.   MRN: [id number]  HPI Pt arrives for follow up. Pt broke leg in April and is now having worsening back pain. Pt currently taking therapy for shoulder. Swelling in legs since surgery-surgery was 07/15/22. Pt has increased water intake. Pt also having tightness in both legs. Pt requesting PCP to order 800 mg ibuprofen due to shoulder pain. Pt also having dental pain and dental insurance doesn't start until next month;requesting antibiotic.   Review of Systems     Objective:   Physical Exam  General-in no acute distress Eyes-no discharge Lungs-respiratory rate normal, CTA CV-no murmurs,RRR Extremities skin warm dry no edema Neuro grossly normal Behavior normal, alert       Assessment & Plan:  Dental pain discomfort antibiotic prescribed see dentist she states she will next month  Right shoulder pain discomfort ibuprofen low-dose infrequent use because of increased risk of ulcers patient was warned what to watch for  She is under a lot of stress with her husband being in the hospital for over a month with infection  Refills given follow-up within 6 months

## 2022-08-21 NOTE — Telephone Encounter (Signed)
Pt seen earlier today and forgot to ask provider about swelling in legs and tightness. Pt is wondering if she is needing another fluid pill. Pt is currently on Lisinopril-HCTZ 20-12.5 mg. Please advise. Thank you

## 2022-08-22 ENCOUNTER — Encounter (HOSPITAL_COMMUNITY): Payer: 59

## 2022-08-24 NOTE — Telephone Encounter (Signed)
Nurses-you could change her lisinopril currently lisinopril-HCTZ 20-12 0.5 I would recommend lisinopril-HCTZ 20-25, #30, 1 daily, 4 refills, repeat met 7 2 weeks after adjusting the dose.  If ongoing troubles with swelling follow-up office visit, otherwise follow-up office visit within 3 months

## 2022-08-26 ENCOUNTER — Ambulatory Visit (HOSPITAL_COMMUNITY): Payer: 59

## 2022-08-26 MED ORDER — LISINOPRIL-HYDROCHLOROTHIAZIDE 20-25 MG PO TABS: ORAL_TABLET | ORAL | 4 refills | Status: DC

## 2022-08-26 NOTE — Telephone Encounter (Signed)
New dose of Lisinopril-HCTZ sent to pharmacy (Lisinopril-HCTZ 20-25mg ). Met 7 lab order placed and pt is aware. Pt verbalized understanding

## 2022-08-26 NOTE — Addendum Note (Signed)
Addended by: Vicente Males on: 08/26/2022 10:13 AM   Modules accepted: Orders

## 2022-08-27 ENCOUNTER — Ambulatory Visit (HOSPITAL_BASED_OUTPATIENT_CLINIC_OR_DEPARTMENT_OTHER): Payer: 59 | Admitting: Orthopaedic Surgery

## 2022-08-28 ENCOUNTER — Telehealth: Payer: Self-pay | Admitting: Family Medicine

## 2022-08-28 ENCOUNTER — Other Ambulatory Visit: Payer: Self-pay | Admitting: Nurse Practitioner

## 2022-08-28 DIAGNOSIS — K047 Periapical abscess without sinus: Secondary | ICD-10-CM

## 2022-08-28 MED ORDER — METRONIDAZOLE 500 MG PO TABS: 500.0000 mg | ORAL_TABLET | Freq: Three times a day (TID) | ORAL | 0 refills | Status: AC

## 2022-08-28 NOTE — Telephone Encounter (Signed)
Pt saw Dr.Scott on 08/21/22-unable to see dentist until next month. Please advise. Thank you

## 2022-08-28 NOTE — Telephone Encounter (Signed)
Patient is requesting stronger antibiotic for toothache. [facility]

## 2022-08-28 NOTE — Telephone Encounter (Signed)
Pt contacted and verbalized understanding.  

## 2022-08-29 ENCOUNTER — Encounter (HOSPITAL_COMMUNITY): Payer: Self-pay

## 2022-08-29 ENCOUNTER — Ambulatory Visit (HOSPITAL_COMMUNITY): Payer: 59 | Attending: Orthopaedic Surgery

## 2022-08-29 DIAGNOSIS — M25511 Pain in right shoulder: Secondary | ICD-10-CM | POA: Insufficient documentation

## 2022-08-29 DIAGNOSIS — R29898 Other symptoms and signs involving the musculoskeletal system: Secondary | ICD-10-CM | POA: Insufficient documentation

## 2022-08-29 NOTE — Therapy (Signed)
OUTPATIENT OCCUPATIONAL THERAPY ORTHO EVALUATION  Patient Name: Tammy Calderon MRN: [id number] DOB:01/21/1966, 56 y.o., female Today's Date: 08/29/2022  PCP: Sallee Lange, MD  REFERRING PROVIDER: [name], MD   OT End of Session - 08/29/22 1130     Visit Number 1    Number of Visits 12    Date for OT Re-Evaluation 10/03/22   Mini reassessment 9/29   Authorization Type [employer]    Authorization Time Period 30 visits OT/PT/SLP combined, auth required after 30th    Progress Note Due on Visit 10    OT Start Time 1035    OT Stop Time 1116    OT Time Calculation (min) 41 min    Activity Tolerance Patient tolerated treatment well    Behavior During Therapy WFL for tasks assessed/performed             Past Medical History:  Diagnosis Date   Anemia    past   Asthma    uses albuterol approx 1-2x a week   Bell's palsy    resolved   Breast cancer ([place])    Fibromyalgia    GERD (gastroesophageal reflux disease)    Hypertension    PONV (postoperative nausea and vomiting)    Past Surgical History:  Procedure Laterality Date   ABDOMINAL HYSTERECTOMY     BREAST BIOPSY WITH RADIO FREQUENCY LOCALIZER Left 12/27/2021   Procedure: EXCISIONAL BREAST BIOPSY WITH RADIO FREQUENCY LOCALIZER X2;  Surgeon: Virl Cagey, MD;  Location: [facility];  Service: General;  Laterality: Left;   LYMPH NODE DISSECTION N/A 03/19/2022   Procedure: LYMPH NODE DISSECTION;  Surgeon: [name], MD;  [facility];  Service: Gynecology;  Laterality: N/A;   NASAL SEPTUM SURGERY     ROBOTIC ASSISTED TOTAL HYSTERECTOMY WITH BILATERAL SALPINGO OOPHERECTOMY Bilateral 03/19/2022   Procedure: XI ROBOTIC ASSISTED TOTAL HYSTERECTOMY WITH BILATERAL SALPINGO OOPHORECTOMY;  Surgeon: [name], MD;  [facility];  Service: Gynecology;  Laterality: Bilateral;   SENTINEL NODE BIOPSY N/A 03/19/2022   Procedure: SENTINEL NODE BIOPSY;  Surgeon: [name], MD;  [facility];  Service:  Gynecology;  Laterality: N/A;   SHOULDER ARTHROSCOPY WITH ROTATOR CUFF REPAIR AND OPEN BICEPS TENODESIS Right 07/15/2022   Procedure: RIGHT SHOULDER ARTHROSCOPY WITH ROTATOR CUFF REPAIR AND OPEN BICEPS TENODESIS;  Surgeon: [name], MD;  Location: [facility];  Service: Orthopedics;  Laterality: Right;   TUBAL LIGATION     WISDOM TOOTH EXTRACTION     Patient Active Problem List   Diagnosis Date Noted   Traumatic complete tear of right rotator cuff    Biceps tendinitis of right upper extremity    Endometrial cancer ([place]) 03/07/2022   PMB (postmenopausal bleeding) 03/07/2022   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   Flat epithelial atypia of breast, left    Atypical lobular hyperplasia of left breast 11/28/2021   History of UTI 11/07/2021   Gross hematuria 10/16/2021   Chronic back pain 12/19/2014   Edema 04/16/2014   Acute sinusitis 01/13/2014   Perimenopause 08/22/2013   Morbid obesity ([place]) 08/22/2013   Unspecified vitamin D deficiency 05/19/2013   Neuropathy 05/19/2013   Fibromyalgia 05/19/2013   Asthma with acute exacerbation 04/21/2013    ONSET DATE: 07/15/22  REFERRING DIAG: Z32.992E (ICD-10-CM) - Traumatic complete tear of right rotator cuff, initial encounter  THERAPY DIAG:  Acute pain of right shoulder  Other symptoms and signs involving the musculoskeletal system  Rationale for Evaluation and  Treatment Rehabilitation  SUBJECTIVE:   SUBJECTIVE STATEMENT: S: "I've had to push off therapy for a little but I have been doing some wall walks." Pt accompanied by: self  PERTINENT HISTORY: Pt presents 6 weeks s/p right rotator cuff repair 07/15/22. Pt reports discontinuing her sling early this week. Pt was referred for skilled OT evaluation and treatment by [name], MD.  PRECAUTIONS: Shoulder  WEIGHT BEARING RESTRICTIONS Yes NWB  PAIN:  Are you having pain? Yes: NPRS scale: 4/10 Pain location: superior and posterior  shoulder and deltoid  region  Pain description: aching  Aggravating factors: sleep, movement  Relieving factors: ibuprofen   8/10 pain at night   FALLS: Has patient fallen in last 6 months? Yes. Number of falls 1 fall down stairs in April landing on left side. Pt reports that fall may have aggravated shoulder pain   LIVING ENVIRONMENT: Lives with: lives with their spouse Lives in: House/apartment  PLOF:  Prior to onset of shoulder pain in November of 2022, pt was independent with all ADLs and IADLs. After onset of shoulder pain, pt required assistance from her daughter to dress, fix hair and other ADLs  PATIENT GOALS "To be able to get back to normal, fix my hair."  OBJECTIVE:   HAND DOMINANCE: Left  ADLs: Overall ADLs: Pt requires assistance from her daughter for dressing, washing and fixing hair, and meal preparation due to ROM limitations and decreased strength. Pt previously worked at the [employer] but does not plan to return to work.   FUNCTIONAL OUTCOME MEASURES: FOTO: 36.13  UPPER EXTREMITY ROM     *Measurements taken with pt in supine position. Adducted IR/er. Passive ROM Right eval  Shoulder flexion 92  Shoulder abduction 69  Shoulder internal rotation 90  Shoulder external rotation 65  (Blank rows = not tested)  Active ROM Right eval  Shoulder flexion   Shoulder abduction   Shoulder internal rotation   Shoulder external rotation   Elbow flexion   Elbow extension   (Blank rows = not tested)    UPPER EXTREMITY MMT:     MMT Right eval  Shoulder flexion   Shoulder abduction   Shoulder internal rotation   Shoulder external rotation   Elbow flexion   Elbow extension   (Blank rows = not tested)   SENSATION: Pt reports some tingling/loss of sensation in her entire hand   COGNITION: Overall cognitive status: Within functional limits for tasks assessed   OBSERVATIONS: Fascial restrictions noted throughout UE, mostly noted at upper  trapezius, biceps, and deltoids   TODAY'S TREATMENT:  Evaluation and established HEP   PATIENT EDUCATION: Education details: AA/ROM Person educated: Patient Education method: Consulting civil engineer, Media planner, and Handouts Education comprehension: verbalized understanding and returned demonstration   HOME EXERCISE PROGRAM: AA/ROM  GOALS: Goals reviewed with patient? Yes  SHORT TERM GOALS: Target date:  09/19/22     Pt will be provided with and educated on HEP to improve mobility in RUE required for ADL completion.   Goal status: INITIAL  2.  Pt will decrease pain in RUE to 2/10 or less in order to sleep for 4+ consecutive hours without waking due to pain.   Goal status: INITIAL  3.  Pt will increase P/ROM in RUE to [facility] to improve ability to perform dressing tasks with minimal compensatory techniques.   Goal status: INITIAL  4.  Pt will increase strength in RUE to 3/5 to improve ability to perform light activity at waist height.  Goal status: INITIAL  LONG TERM GOALS: Target date:  10/03/22     Pt will decrease pain in RUE to 0/10 or less in order to complete daily tasks such as meal preparation and showering without requiring rest breaks due to increased pain.   Goal status: INITIAL  2.  Pt will decrease RUE fascial restrictions to minimal amounts or less to improve mobility required for overhead reaching tasks.    Goal status: INITIAL  3.  Pt will increase A/ROM of RUE to [facility] to improve ability to reach overhead and behind back during dressing and bathing tasks.   Goal status: INITIAL  4.  Pt will increase strength in RUE to 4+/5 to improve ability to perform lifting tasks required for lifting pots and pans for meal preparation.   Goal status: INITIAL   ASSESSMENT:  CLINICAL IMPRESSION: Patient is a 56 y.o. female who was seen today for occupational therapy evaluation s/p right rotator cuff repair on 07/15/22. She presents today with some increased pain and  fascial restrictions, and well as decreased ROM and strength limiting her ability to complete ADLs independently. Pt was able to demonstrate AA/ROM exercises following demonstration and with verbal cues from therapist, with slight discomfort but no significnat increased pain reported.   PERFORMANCE DEFICITS in functional skills including ADLs, IADLs, ROM, strength, pain, fascial restrictions, muscle spasms, and UE functional use.  IMPAIRMENTS are limiting patient from ADLs, IADLs, rest and sleep, work, leisure, and social participation.   COMORBIDITIES may have co-morbidities  that affects occupational performance. Patient will benefit from skilled OT to address above impairments and improve overall function.  MODIFICATION OR ASSISTANCE TO COMPLETE EVALUATION: No modification of tasks or assist necessary to complete an evaluation.  OT OCCUPATIONAL PROFILE AND HISTORY: Problem focused assessment: Including review of records relating to presenting problem.  CLINICAL DECISION MAKING: LOW - limited treatment options, no task modification necessary  REHAB POTENTIAL: Good  EVALUATION COMPLEXITY: Low      PLAN: OT FREQUENCY: 2x/week  OT DURATION: 6 weeks  PLANNED INTERVENTIONS: self care/ADL training, therapeutic exercise, therapeutic activity, manual therapy, scar mobilization, manual lymph drainage, passive range of motion, moist heat, cryotherapy, patient/family education, energy conservation, coping strategies training, and DME and/or AE instructions  CONSULTED AND AGREED WITH PLAN OF CARE: Patient  PLAN FOR NEXT SESSION: Manual, P/ROM, follow up on HEP, AA/ROM, table slides   Flonnie Hailstone, OTD, OTR/L [phone]  08/29/2022, 11:42 AM

## 2022-08-29 NOTE — Patient Instructions (Signed)
Perform each exercise ____10-15____ reps. 2-3x days.   1) Protraction   Start by holding a wand or cane at chest height.  Next, slowly push the wand outwards in front of your body so that your elbows become fully straightened. Then, return to the original position.     2) Shoulder FLEXION   In the standing position, hold a wand/cane with both arms, palms down on both sides. Raise up the wand/cane allowing your unaffected arm to perform most of the effort. Your affected arm should be partially relaxed.      3) Internal/External ROTATION   In the standing position, hold a wand/cane with both hands keeping your elbows bent. Move your arms and wand/cane to one side.  Your affected arm should be partially relaxed while your unaffected arm performs most of the effort.       4) Shoulder ABDUCTION   While holding a wand/cane palm face up on the injured side and palm face down on the uninjured side, slowly raise up your injured arm to the side.                

## 2022-09-05 ENCOUNTER — Ambulatory Visit (HOSPITAL_COMMUNITY): Payer: 59 | Admitting: Occupational Therapy

## 2022-09-09 ENCOUNTER — Encounter (HOSPITAL_COMMUNITY): Payer: Self-pay | Admitting: Occupational Therapy

## 2022-09-09 ENCOUNTER — Ambulatory Visit (HOSPITAL_COMMUNITY): Payer: 59 | Admitting: Occupational Therapy

## 2022-09-09 DIAGNOSIS — R29898 Other symptoms and signs involving the musculoskeletal system: Secondary | ICD-10-CM | POA: Diagnosis not present

## 2022-09-09 DIAGNOSIS — M25511 Pain in right shoulder: Secondary | ICD-10-CM | POA: Diagnosis not present

## 2022-09-09 NOTE — Therapy (Signed)
OUTPATIENT OCCUPATIONAL THERAPY ORTHO TREATMENT  Patient Name: MALLORIE NORROD MRN: [id number] DOB:1966/09/20, 56 y.o., female Today's Date: 09/09/2022  PCP: Sallee Lange, MD  REFERRING PROVIDER: [name], MD   OT End of Session - 09/09/22 1213     Visit Number 2    Number of Visits 12    Date for OT Re-Evaluation 10/03/22   Mini reassessment 9/29   Authorization Type [employer]    Authorization Time Period 30 visits OT/PT/SLP combined, auth required after 30th    Progress Note Due on Visit 10    OT Start Time 1115    OT Stop Time 1158    OT Time Calculation (min) 43 min    Activity Tolerance Patient tolerated treatment well    Behavior During Therapy WFL for tasks assessed/performed              Past Medical History:  Diagnosis Date   Anemia    past   Asthma    uses albuterol approx 1-2x a week   Bell's palsy    resolved   Breast cancer ([place])    Fibromyalgia    GERD (gastroesophageal reflux disease)    Hypertension    PONV (postoperative nausea and vomiting)    Past Surgical History:  Procedure Laterality Date   ABDOMINAL HYSTERECTOMY     BREAST BIOPSY WITH RADIO FREQUENCY LOCALIZER Left 12/27/2021   Procedure: EXCISIONAL BREAST BIOPSY WITH RADIO FREQUENCY LOCALIZER X2;  Surgeon: Virl Cagey, MD;  Location: [facility];  Service: General;  Laterality: Left;   LYMPH NODE DISSECTION N/A 03/19/2022   Procedure: LYMPH NODE DISSECTION;  Surgeon: [name], MD;  Location: [facility];  Service: Gynecology;  Laterality: N/A;   NASAL SEPTUM SURGERY     ROBOTIC ASSISTED TOTAL HYSTERECTOMY WITH BILATERAL SALPINGO OOPHERECTOMY Bilateral 03/19/2022   Procedure: XI ROBOTIC ASSISTED TOTAL HYSTERECTOMY WITH BILATERAL SALPINGO OOPHORECTOMY;  Surgeon: [name], MD;  Location: [facility];  Service: Gynecology;  Laterality: Bilateral;   SENTINEL NODE BIOPSY N/A 03/19/2022   Procedure: SENTINEL NODE BIOPSY;  Surgeon: [name], MD;  Location: [facility];  Service:  Gynecology;  Laterality: N/A;   SHOULDER ARTHROSCOPY WITH ROTATOR CUFF REPAIR AND OPEN BICEPS TENODESIS Right 07/15/2022   Procedure: RIGHT SHOULDER ARTHROSCOPY WITH ROTATOR CUFF REPAIR AND OPEN BICEPS TENODESIS;  Surgeon: [name], MD;  Location: [place];  Service: Orthopedics;  Laterality: Right;   TUBAL LIGATION     WISDOM TOOTH EXTRACTION     Patient Active Problem List   Diagnosis Date Noted   Traumatic complete tear of right rotator cuff    Biceps tendinitis of right upper extremity    Endometrial cancer ([place]) 03/07/2022   PMB (postmenopausal bleeding) 03/07/2022   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   Flat epithelial atypia of breast, left    Atypical lobular hyperplasia of left breast 11/28/2021   History of UTI 11/07/2021   Gross hematuria 10/16/2021   Chronic back pain 12/19/2014   Edema 04/16/2014   Acute sinusitis 01/13/2014   Perimenopause 08/22/2013   Morbid obesity ([place]) 08/22/2013   Unspecified vitamin D deficiency 05/19/2013   Neuropathy 05/19/2013   Fibromyalgia 05/19/2013   Asthma with acute exacerbation 04/21/2013    ONSET DATE: 07/15/22  REFERRING DIAG: A25.053Z (ICD-10-CM) - Traumatic complete tear of right rotator cuff, initial encounter  THERAPY DIAG:  Acute pain of right shoulder  Other symptoms and signs involving the musculoskeletal system  Rationale for Evaluation  and Treatment Rehabilitation  SUBJECTIVE:   SUBJECTIVE STATEMENT: S: "I'm still having so much pain and I just feel like it's really limiting me."  PRECAUTIONS: Shoulder  WEIGHT BEARING RESTRICTIONS Yes NWB  PAIN:  Are you having pain? Yes: NPRS scale: 7/10 Pain location: superior and posterior shoulder and deltoid  region  Pain description: aching  Aggravating factors: sleep, movement  Relieving factors: ibuprofen   OBJECTIVE:   HAND DOMINANCE: Left  FUNCTIONAL OUTCOME MEASURES: FOTO: 36.13  UPPER EXTREMITY ROM      *Measurements taken with pt in supine position. Adducted IR/er. Passive ROM Right eval  Shoulder flexion 92  Shoulder abduction 69  Shoulder internal rotation 90  Shoulder external rotation 65  (Blank rows = not tested)  Active ROM Right eval  Shoulder flexion   Shoulder abduction   Shoulder internal rotation   Shoulder external rotation   Elbow flexion   Elbow extension   (Blank rows = not tested)    UPPER EXTREMITY MMT:     MMT Right eval  Shoulder flexion   Shoulder abduction   Shoulder internal rotation   Shoulder external rotation   Elbow flexion   Elbow extension   (Blank rows = not tested)   SENSATION: Pt reports some tingling/loss of sensation in her entire hand   COGNITION: Overall cognitive status: Within functional limits for tasks assessed   OBSERVATIONS: Fascial restrictions noted throughout UE, mostly noted at upper trapezius, biceps, and deltoids   TODAY'S TREATMENT:  09/09/22 -Manual Therapy: myofascial release, trigger point massage to address fascial restrictions, in order to reduce pain and improve ROM. -P/ROM: flexion, abduction, er/IR, horizontal abduction, 1x10  -Ball Rolls: forward flexion, abduction, 1x10 -AA/ROM: flexion, abduction, protraction, er/IR   PATIENT EDUCATION: Education details: Table Slides (flexion and abduction) and Wall slides (flexion) Person educated: Patient Education method: Explanation, Demonstration, and Handouts Education comprehension: verbalized understanding and returned demonstration   HOME EXERCISE PROGRAM: Eval: AA/ROM 9/19: table slides, wall slides  GOALS: Goals reviewed with patient? Yes  SHORT TERM GOALS: Target date:  09/19/22     Pt will be provided with and educated on HEP to improve mobility in RUE required for ADL completion.   Goal status: IN PROGRESS  2.  Pt will decrease pain in RUE to 2/10 or less in order to sleep for 4+ consecutive hours without waking due to pain.   Goal  status: IN PROGRESS  3.  Pt will increase P/ROM in RUE to [facility] to improve ability to perform dressing tasks with minimal compensatory techniques.   Goal status: IN PROGRESS  4.  Pt will increase strength in RUE to 3/5 to improve ability to perform light activity at waist height.  Goal status: IN PROGRESS    LONG TERM GOALS: Target date:  10/03/22     Pt will decrease pain in RUE to 0/10 or less in order to complete daily tasks such as meal preparation and showering without requiring rest breaks due to increased pain.   Goal status: IN PROGRESS  2.  Pt will decrease RUE fascial restrictions to minimal amounts or less to improve mobility required for overhead reaching tasks.    Goal status: IN PROGRESS  3.  Pt will increase A/ROM of RUE to [facility] to improve ability to reach overhead and behind back during dressing and bathing tasks.   Goal status: IN PROGRESS  4.  Pt will increase strength in RUE to 4+/5 to improve ability to perform lifting tasks required for lifting pots  and pans for meal preparation.   Goal status: IN PROGRESS   ASSESSMENT:  CLINICAL IMPRESSION: Patient presenting to therapy session with increased pain throughout superior, anterior and posterior shoulder. She has significant fascial restrictions and muscle tightness noted in biceps, pectoralis, and axillary region, addressed with manual therapy. Pt reporting increased pain with P/ROM and AA/ROM. She required multiple rest breaks this session to assist with easing the pain. Therapist providing mod verbal and tactile cuing for compensations and proper mechanics with AA/ROM. Provided education on importance of completing HEP to progress and improve ROM and decrease pain from fascial restrictions and muscle tightness.    PLAN: OT FREQUENCY: 2x/week  OT DURATION: 6 weeks  PLANNED INTERVENTIONS: self care/ADL training, therapeutic exercise, therapeutic activity, manual therapy, scar mobilization, manual lymph  drainage, passive range of motion, moist heat, cryotherapy, patient/family education, energy conservation, coping strategies training, and DME and/or AE instructions  CONSULTED AND AGREED WITH PLAN OF CARE: Patient  PLAN FOR NEXT SESSION: Manual, P/ROM, follow up on HEP, AA/ROM, table slides   Paulita Fujita, OTR/L [phone]  09/09/2022, 12:14 PM

## 2022-09-11 ENCOUNTER — Ambulatory Visit (HOSPITAL_COMMUNITY): Payer: 59 | Admitting: Occupational Therapy

## 2022-09-11 ENCOUNTER — Encounter (HOSPITAL_COMMUNITY): Payer: Self-pay | Admitting: Occupational Therapy

## 2022-09-11 DIAGNOSIS — M25511 Pain in right shoulder: Secondary | ICD-10-CM

## 2022-09-11 DIAGNOSIS — R29898 Other symptoms and signs involving the musculoskeletal system: Secondary | ICD-10-CM | POA: Diagnosis not present

## 2022-09-11 NOTE — Patient Instructions (Signed)
  Complete the following 2 a day. Hold for 10+ seconds. Complete 5-8 sets for each.   1) SHOULDER - ISOMETRIC FLEXION  Gently push your fist forward into a wall with your elbow bent.    2) SHOULDER - ISOMETRIC EXTENSION  Gently push your a bent elbow back into a wall.    3) SHOULDER - ISOMETRIC INTERNAL ROTATION   Gently press your hand into a wall using the palm side of your hand.  Maintain a bent elbow the entire time.        4) SHOULDER - ISOMETRIC ADDUCTION  Gently push your elbow into the side of your body.   5) SHOULDER - ISOMETRIC ABDUCTION  Gently push your elbow out to the side into a wall with your elbow bent.

## 2022-09-11 NOTE — Therapy (Signed)
OUTPATIENT OCCUPATIONAL THERAPY ORTHO TREATMENT  Patient Name: ONEDIA VARGUS MRN: [id number] DOB:06-28-1966, 56 y.o., female Today's Date: 09/11/2022  PCP: Sallee Lange, MD  REFERRING PROVIDER: [name], MD   OT End of Session - 09/11/22 1702     Visit Number 3    Number of Visits 13    Date for OT Re-Evaluation 10/03/22   Mini reassessment 9/29   Authorization Type [employer]    Authorization Time Period 30 visits OT/PT/SLP combined, auth required after 30th    Progress Note Due on Visit 10    OT Start Time 1450    OT Stop Time 1525    OT Time Calculation (min) 35 min    Activity Tolerance Patient tolerated treatment well    Behavior During Therapy WFL for tasks assessed/performed               Past Medical History:  Diagnosis Date   Anemia    past   Asthma    uses albuterol approx 1-2x a week   Bell's palsy    resolved   Breast cancer ([place])    Fibromyalgia    GERD (gastroesophageal reflux disease)    Hypertension    PONV (postoperative nausea and vomiting)    Past Surgical History:  Procedure Laterality Date   ABDOMINAL HYSTERECTOMY     BREAST BIOPSY WITH RADIO FREQUENCY LOCALIZER Left 12/27/2021   Procedure: EXCISIONAL BREAST BIOPSY WITH RADIO FREQUENCY LOCALIZER X2;  Surgeon: Virl Cagey, MD;  Location: [facility];  Service: General;  Laterality: Left;   LYMPH NODE DISSECTION N/A 03/19/2022   Procedure: LYMPH NODE DISSECTION;  Surgeon: [name], MD;  [facility]: [facility];  Service: Gynecology;  Laterality: N/A;   NASAL SEPTUM SURGERY     ROBOTIC ASSISTED TOTAL HYSTERECTOMY WITH BILATERAL SALPINGO OOPHERECTOMY Bilateral 03/19/2022   Procedure: XI ROBOTIC ASSISTED TOTAL HYSTERECTOMY WITH BILATERAL SALPINGO OOPHORECTOMY;  Surgeon: [name], MD;  [facility]: [facility];  Service: Gynecology;  Laterality: Bilateral;   SENTINEL NODE BIOPSY N/A 03/19/2022   Procedure: SENTINEL NODE BIOPSY;  Surgeon: [name], MD;  [facility];   Service: Gynecology;  Laterality: N/A;   SHOULDER ARTHROSCOPY WITH ROTATOR CUFF REPAIR AND OPEN BICEPS TENODESIS Right 07/15/2022   Procedure: RIGHT SHOULDER ARTHROSCOPY WITH ROTATOR CUFF REPAIR AND OPEN BICEPS TENODESIS;  Surgeon: [name], MD;  Location: [place];  Service: Orthopedics;  Laterality: Right;   TUBAL LIGATION     WISDOM TOOTH EXTRACTION     Patient Active Problem List   Diagnosis Date Noted   Traumatic complete tear of right rotator cuff    Biceps tendinitis of right upper extremity    Endometrial cancer ([place]) 03/07/2022   PMB (postmenopausal bleeding) 03/07/2022   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   Flat epithelial atypia of breast, left    Atypical lobular hyperplasia of left breast 11/28/2021   History of UTI 11/07/2021   Gross hematuria 10/16/2021   Chronic back pain 12/19/2014   Edema 04/16/2014   Acute sinusitis 01/13/2014   Perimenopause 08/22/2013   Morbid obesity ([place]) 08/22/2013   Unspecified vitamin D deficiency 05/19/2013   Neuropathy 05/19/2013   Fibromyalgia 05/19/2013   Asthma with acute exacerbation 04/21/2013    ONSET DATE: 07/15/22  REFERRING DIAG: X83.382N (ICD-10-CM) - Traumatic complete tear of right rotator cuff, initial encounter  THERAPY DIAG:  Acute pain of right shoulder  Other symptoms and signs involving the musculoskeletal system  Rationale for  Evaluation and Treatment Rehabilitation  SUBJECTIVE:   SUBJECTIVE STATEMENT: S: "Is it supposed to still be this tender?"  PRECAUTIONS: Shoulder  WEIGHT BEARING RESTRICTIONS Yes NWB  PAIN:  Are you having pain? Yes: NPRS scale: 7/10 Pain location: superior and posterior shoulder and deltoid  region  Pain description: aching  Aggravating factors: sleep, movement  Relieving factors: ibuprofen   OBJECTIVE:   HAND DOMINANCE: Left  FUNCTIONAL OUTCOME MEASURES: FOTO: 36.13  UPPER EXTREMITY ROM     *Measurements taken with pt in  supine position. Adducted IR/er. Passive ROM Right eval  Shoulder flexion 92  Shoulder abduction 69  Shoulder internal rotation 90  Shoulder external rotation 65  (Blank rows = not tested)  Active ROM Right eval  Shoulder flexion   Shoulder abduction   Shoulder internal rotation   Shoulder external rotation   Elbow flexion   Elbow extension   (Blank rows = not tested)    UPPER EXTREMITY MMT:     MMT Right eval  Shoulder flexion   Shoulder abduction   Shoulder internal rotation   Shoulder external rotation   Elbow flexion   Elbow extension   (Blank rows = not tested)   SENSATION: Pt reports some tingling/loss of sensation in her entire hand   COGNITION: Overall cognitive status: Within functional limits for tasks assessed   OBSERVATIONS: Fascial restrictions noted throughout UE, mostly noted at upper trapezius, biceps, and deltoids   TODAY'S TREATMENT:  09/11/22 -Manual Therapy: myofascial release, trigger point massage to address fascial restrictions, in order to reduce pain and improve ROM. -AA/ROM: flexion, abduction, protraction, er/IR, 1x10  -Isometrics: flexion, extension, abduction, adduction, er/IR, 5x10" -Wall Washing: flexion and abduction, 1x10 -Proximal shoulder strengthening: ball on the wall 1x45"  09/09/22 -Manual Therapy: myofascial release, trigger point massage to address fascial restrictions, in order to reduce pain and improve ROM. -P/ROM: flexion, abduction, er/IR, horizontal abduction, 1x10  -Ball Rolls: forward flexion, abduction, 1x10 -AA/ROM: flexion, abduction, protraction, er/IR   PATIENT EDUCATION: Education details: Table Slides (flexion and abduction) and Wall slides (flexion) Person educated: Patient Education method: Explanation, Demonstration, and Handouts Education comprehension: verbalized understanding and returned demonstration   HOME EXERCISE PROGRAM: Eval: AA/ROM 9/19: table slides, wall slides  GOALS: Goals  reviewed with patient? Yes  SHORT TERM GOALS: Target date:  09/19/22     Pt will be provided with and educated on HEP to improve mobility in RUE required for ADL completion.   Goal status: IN PROGRESS  2.  Pt will decrease pain in RUE to 2/10 or less in order to sleep for 4+ consecutive hours without waking due to pain.   Goal status: IN PROGRESS  3.  Pt will increase P/ROM in RUE to [facility] to improve ability to perform dressing tasks with minimal compensatory techniques.   Goal status: IN PROGRESS  4.  Pt will increase strength in RUE to 3/5 to improve ability to perform light activity at waist height.  Goal status: IN PROGRESS    LONG TERM GOALS: Target date:  10/03/22     Pt will decrease pain in RUE to 0/10 or less in order to complete daily tasks such as meal preparation and showering without requiring rest breaks due to increased pain.   Goal status: IN PROGRESS  2.  Pt will decrease RUE fascial restrictions to minimal amounts or less to improve mobility required for overhead reaching tasks.    Goal status: IN PROGRESS  3.  Pt will increase A/ROM of RUE to  WFL to improve ability to reach overhead and behind back during dressing and bathing tasks.   Goal status: IN PROGRESS  4.  Pt will increase strength in RUE to 4+/5 to improve ability to perform lifting tasks required for lifting pots and pans for meal preparation.   Goal status: IN PROGRESS   ASSESSMENT:  CLINICAL IMPRESSION:  Pt presenting to therapy with increased tenderness throughout her bicep, pectoralis, and anterior shoulder. She continues to have fascial restrictions and muscle tightness through her biceps and pectoralis, addressed with manual therapy. Therapist added in proximal shoulder strengthening and isometrics this session to work on stabilizing shoulder muscles, which pt demonstrated fatigue with both exercises, requiring rest breaks. Pt needed mod verbal and tactile cuing this session for positioning  and proper mechanics to lessen compensatory techniques. Pt was educated on continuing AA/ROM HEP and wall slides to continue working on improving ROM.   PLAN: OT FREQUENCY: 2x/week  OT DURATION: 6 weeks  PLANNED INTERVENTIONS: self care/ADL training, therapeutic exercise, therapeutic activity, manual therapy, scar mobilization, manual lymph drainage, passive range of motion, moist heat, cryotherapy, patient/family education, energy conservation, coping strategies training, and DME and/or AE instructions  CONSULTED AND AGREED WITH PLAN OF CARE: Patient  PLAN FOR NEXT SESSION: Manual, P/ROM, follow up on HEP, AA/ROM, table slides   Paulita Fujita, OTR/L [phone]  09/11/2022, 5:05 PM

## 2022-09-15 ENCOUNTER — Encounter (HOSPITAL_COMMUNITY): Payer: 59 | Admitting: Occupational Therapy

## 2022-09-16 ENCOUNTER — Ambulatory Visit (HOSPITAL_COMMUNITY): Payer: 59 | Admitting: Occupational Therapy

## 2022-09-16 DIAGNOSIS — M25511 Pain in right shoulder: Secondary | ICD-10-CM

## 2022-09-16 DIAGNOSIS — R29898 Other symptoms and signs involving the musculoskeletal system: Secondary | ICD-10-CM | POA: Diagnosis not present

## 2022-09-16 NOTE — Therapy (Unsigned)
OUTPATIENT OCCUPATIONAL THERAPY ORTHO TREATMENT  Patient Name: NEL STONEKING MRN: [id number] DOB:15-Mar-1966, 56 y.o., female Today's Date: 09/17/2022  PCP: Sallee Lange, MD  REFERRING PROVIDER: [name], MD   OT End of Session - 09/16/22 1515     Visit Number 4    Number of Visits 13    Date for OT Re-Evaluation 10/03/22   Mini reassessment 9/29   Authorization Type [employer]    Authorization Time Period 30 visits OT/PT/SLP combined, auth required after 30th    Progress Note Due on Visit 10    OT Start Time 1431    OT Stop Time 1511    OT Time Calculation (min) 40 min    Activity Tolerance Patient tolerated treatment well    Behavior During Therapy WFL for tasks assessed/performed                Past Medical History:  Diagnosis Date   Anemia    past   Asthma    uses albuterol approx 1-2x a week   Bell's palsy    resolved   Breast cancer ([place])    Fibromyalgia    GERD (gastroesophageal reflux disease)    Hypertension    PONV (postoperative nausea and vomiting)    Past Surgical History:  Procedure Laterality Date   ABDOMINAL HYSTERECTOMY     BREAST BIOPSY WITH RADIO FREQUENCY LOCALIZER Left 12/27/2021   Procedure: EXCISIONAL BREAST BIOPSY WITH RADIO FREQUENCY LOCALIZER X2;  Surgeon: Virl Cagey, MD;  Location: [facility];  Service: General;  Laterality: Left;   LYMPH NODE DISSECTION N/A 03/19/2022   Procedure: LYMPH NODE DISSECTION;  Surgeon: [name], MD;  Location: [facility];  Service: Gynecology;  Laterality: N/A;   NASAL SEPTUM SURGERY     ROBOTIC ASSISTED TOTAL HYSTERECTOMY WITH BILATERAL SALPINGO OOPHERECTOMY Bilateral 03/19/2022   Procedure: XI ROBOTIC ASSISTED TOTAL HYSTERECTOMY WITH BILATERAL SALPINGO OOPHORECTOMY;  Surgeon: [name], MD;  Location: [facility];  Service: Gynecology;  Laterality: Bilateral;   SENTINEL NODE BIOPSY N/A 03/19/2022   Procedure: SENTINEL NODE BIOPSY;  Surgeon: [name], MD;  Location: [facility];   Service: Gynecology;  Laterality: N/A;   SHOULDER ARTHROSCOPY WITH ROTATOR CUFF REPAIR AND OPEN BICEPS TENODESIS Right 07/15/2022   Procedure: RIGHT SHOULDER ARTHROSCOPY WITH ROTATOR CUFF REPAIR AND OPEN BICEPS TENODESIS;  Surgeon: [name], MD;  Location: [facility];  Service: Orthopedics;  Laterality: Right;   TUBAL LIGATION     WISDOM TOOTH EXTRACTION     Patient Active Problem List   Diagnosis Date Noted   Traumatic complete tear of right rotator cuff    Biceps tendinitis of right upper extremity    Endometrial cancer ([place]) 03/07/2022   PMB (postmenopausal bleeding) 03/07/2022   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   Flat epithelial atypia of breast, left    Atypical lobular hyperplasia of left breast 11/28/2021   History of UTI 11/07/2021   Gross hematuria 10/16/2021   Chronic back pain 12/19/2014   Edema 04/16/2014   Acute sinusitis 01/13/2014   Perimenopause 08/22/2013   Morbid obesity ([place]) 08/22/2013   Unspecified vitamin D deficiency 05/19/2013   Neuropathy 05/19/2013   Fibromyalgia 05/19/2013   Asthma with acute exacerbation 04/21/2013    ONSET DATE: 07/15/22  REFERRING DIAG: P10.258N (ICD-10-CM) - Traumatic complete tear of right rotator cuff, initial encounter  THERAPY DIAG:  Acute pain of right shoulder  Other symptoms and signs involving the musculoskeletal system  Rationale  for Evaluation and Treatment Rehabilitation  SUBJECTIVE:   SUBJECTIVE STATEMENT: S: "I'm able to start doing more things for myself, like cooking on the stove."  PRECAUTIONS: Shoulder  WEIGHT BEARING RESTRICTIONS Yes NWB  PAIN:  Are you having pain? Yes: NPRS scale: 6/10 Pain location: superior and posterior shoulder and deltoid  region  Pain description: aching  Aggravating factors: sleep, movement  Relieving factors: ibuprofen   OBJECTIVE:   HAND DOMINANCE: Left  FUNCTIONAL OUTCOME MEASURES: FOTO: 36.13  UPPER EXTREMITY ROM      *Measurements taken with pt in supine position. Adducted IR/er. Passive ROM Right eval  Shoulder flexion 92  Shoulder abduction 69  Shoulder internal rotation 90  Shoulder external rotation 65  (Blank rows = not tested)  Active ROM Right eval  Shoulder flexion   Shoulder abduction   Shoulder internal rotation   Shoulder external rotation   Elbow flexion   Elbow extension   (Blank rows = not tested)    UPPER EXTREMITY MMT:     MMT Right eval  Shoulder flexion   Shoulder abduction   Shoulder internal rotation   Shoulder external rotation   Elbow flexion   Elbow extension   (Blank rows = not tested)   SENSATION: Pt reports some tingling/loss of sensation in her entire hand   COGNITION: Overall cognitive status: Within functional limits for tasks assessed   OBSERVATIONS: Fascial restrictions noted throughout UE, mostly noted at upper trapezius, biceps, and deltoids   TODAY'S TREATMENT:  09/16/22 -Manual Therapy: myofascial release, trigger point massage to address fascial restrictions, in order to reduce pain and improve ROM. -AA/ROM: flexion, abduction, protraction, er/IR, 1x10  -A/ROM: flexion, abduction, er/IR, 1x10 -Scapula strengthening: retraction, rows, extension, protraction  09/11/22 -Manual Therapy: myofascial release, trigger point massage to address fascial restrictions, in order to reduce pain and improve ROM. -AA/ROM: flexion, abduction, protraction, er/IR, 1x10  -Isometrics: flexion, extension, abduction, adduction, er/IR, 5x10" -Wall Washing: flexion and abduction, 1x10 -Proximal shoulder strengthening: ball on the wall 1x45"  09/09/22 -Manual Therapy: myofascial release, trigger point massage to address fascial restrictions, in order to reduce pain and improve ROM. -P/ROM: flexion, abduction, er/IR, horizontal abduction, 1x10  -Ball Rolls: forward flexion, abduction, 1x10 -AA/ROM: flexion, abduction, protraction, er/IR   PATIENT  EDUCATION: Education details: A/ROM and English as a second language teacher Person educated: Patient Education method: Explanation, Demonstration, and Handouts Education comprehension: verbalized understanding and returned demonstration   HOME EXERCISE PROGRAM: Eval: AA/ROM 9/19: table slides, wall slides 9/26: A/ROM and Scap strengthening (red Theraband)  GOALS: Goals reviewed with patient? Yes  SHORT TERM GOALS: Target date:  09/19/22     Pt will be provided with and educated on HEP to improve mobility in RUE required for ADL completion.   Goal status: IN PROGRESS  2.  Pt will decrease pain in RUE to 2/10 or less in order to sleep for 4+ consecutive hours without waking due to pain.   Goal status: IN PROGRESS  3.  Pt will increase P/ROM in RUE to [facility] to improve ability to perform dressing tasks with minimal compensatory techniques.   Goal status: IN PROGRESS  4.  Pt will increase strength in RUE to 3/5 to improve ability to perform light activity at waist height.  Goal status: IN PROGRESS    LONG TERM GOALS: Target date:  10/03/22     Pt will decrease pain in RUE to 0/10 or less in order to complete daily tasks such as meal preparation and showering without requiring rest breaks due  to increased pain.   Goal status: IN PROGRESS  2.  Pt will decrease RUE fascial restrictions to minimal amounts or less to improve mobility required for overhead reaching tasks.    Goal status: IN PROGRESS  3.  Pt will increase A/ROM of RUE to [facility] to improve ability to reach overhead and behind back during dressing and bathing tasks.   Goal status: IN PROGRESS  4.  Pt will increase strength in RUE to 4+/5 to improve ability to perform lifting tasks required for lifting pots and pans for meal preparation.   Goal status: IN PROGRESS   ASSESSMENT:  CLINICAL IMPRESSION: Pt presenting to therapy with decreasing pain, however she reports that sleeping is still difficult due to pain. Overall her  fascial restrictions are reducing, with bicep tendon into the pectoralis having moderate restrictions. Therapist introduced A/ROM this session, which pt required rest breaks in between due to pain, however movement pattern looks good. Pt requiring mod verbal cuing from therapist, for proper mechanics and to reduce compensatory techniques, such as shoulder hiking and bending her elbow during ROM tasks. She continues to benefit from skilled OT to improve ROM and strength in order to become independent with her daily tasks again.   PLAN: OT FREQUENCY: 2x/week  OT DURATION: 6 weeks  PLANNED INTERVENTIONS: self care/ADL training, therapeutic exercise, therapeutic activity, manual therapy, scar mobilization, manual lymph drainage, passive range of motion, moist heat, cryotherapy, patient/family education, energy conservation, coping strategies training, and DME and/or AE instructions  CONSULTED AND AGREED WITH PLAN OF CARE: Patient  PLAN FOR NEXT SESSION: Manual, P/ROM, follow up on HEP, AA/ROM, table slides, A/ROM, scap strengthening, proximal shoulder strengthening   Paulita Fujita, OTR/L [phone]  09/17/2022, 11:48 AM

## 2022-09-16 NOTE — Patient Instructions (Signed)
Repeat all exercises 10-15 times, 1-2 times per day.  1) Shoulder Protraction    Begin with elbows by your side, slowly "punch" straight out in front of you.      2) Shoulder Flexion  Supine:     Standing:         Begin with arms at your side with thumbs pointed up, slowly raise both arms up and forward towards overhead.               3) Horizontal abduction/adduction  Supine:   Standing:           Begin with arms straight out in front of you, bring out to the side in at "T" shape. Keep arms straight entire time.                 4) Internal & External Rotation   Supine:     Standing:     Stand with elbows at the side and elbows bent 90 degrees. Move your forearms away from your body, then bring back inward toward the body.     5) Shoulder Abduction  Supine:     Standing:       Lying on your back begin with your arms flat on the table next to your side. Slowly move your arms out to the side so that they go overhead, in a jumping jack or snow angel movement.   1) (Home) Extension: Isometric / Bilateral Arm Retraction - Sitting   Facing anchor, hold hands and elbow at shoulder height, with elbow bent.  Pull arms back to squeeze shoulder blades together. Repeat 10-15 times. 1-3 times/day.   2) (Clinic) Extension / Flexion (Assist)   Face anchor, pull arms back, keeping elbow straight, and squeze shoulder blades together. Repeat 10-15 times. 1-3 times/day.   Copyright  VHI. All rights reserved.   3) (Home) Retraction: Row - Bilateral (Anchor)   Facing anchor, arms reaching forward, pull hands toward stomach, keeping elbows bent and at your sides and pinching shoulder blades together. Repeat 10-15 times. 1-3 times/day.   Copyright  VHI. All rights reserved.     

## 2022-09-17 ENCOUNTER — Encounter (HOSPITAL_COMMUNITY): Payer: Self-pay | Admitting: Occupational Therapy

## 2022-09-17 ENCOUNTER — Encounter (HOSPITAL_COMMUNITY): Payer: 59 | Admitting: Occupational Therapy

## 2022-09-18 ENCOUNTER — Ambulatory Visit (HOSPITAL_COMMUNITY): Payer: 59 | Admitting: Occupational Therapy

## 2022-09-22 ENCOUNTER — Ambulatory Visit (HOSPITAL_COMMUNITY): Payer: 59 | Attending: Orthopaedic Surgery | Admitting: Occupational Therapy

## 2022-09-22 ENCOUNTER — Encounter (HOSPITAL_COMMUNITY): Payer: Self-pay | Admitting: Occupational Therapy

## 2022-09-22 DIAGNOSIS — M25511 Pain in right shoulder: Secondary | ICD-10-CM | POA: Diagnosis not present

## 2022-09-22 DIAGNOSIS — R29898 Other symptoms and signs involving the musculoskeletal system: Secondary | ICD-10-CM | POA: Insufficient documentation

## 2022-09-22 NOTE — Therapy (Signed)
OUTPATIENT OCCUPATIONAL THERAPY ORTHO TREATMENT  Patient Name: Tammy Calderon MRN: [id number] DOB:Jul 29, 1966, 56 y.o., female Today's Date: 09/22/2022  PCP: Sallee Lange, MD  REFERRING PROVIDER: [name], MD   OT End of Session - 09/22/22 1502     Visit Number 5    Number of Visits 13    Date for OT Re-Evaluation 10/03/22   Mini reassessment 9/29   Authorization Type [employer]et[employer]    Authorization Time Period 30 visits OT/PT/SLP combined, auth required after 30th    Progress Note Due on Visit 10    OT Start Time 1425    OT Stop Time 1510    OT Time Calculation (min) 45 min    Activity Tolerance Patient tolerated treatment well    Behavior During Therapy WFL for tasks assessed/performed             Past Medical History:  Diagnosis Date   Anemia    past   Asthma    uses albuterol approx 1-2x a week   Bell's palsy    resolved   Breast cancer ([place])    Fibromyalgia    GERD (gastroesophageal reflux disease)    Hypertension    PONV (postoperative nausea and vomiting)    Past Surgical History:  Procedure Laterality Date   ABDOMINAL HYSTERECTOMY     BREAST BIOPSY WITH RADIO FREQUENCY LOCALIZER Left 12/27/2021   Procedure: EXCISIONAL BREAST BIOPSY WITH RADIO FREQUENCY LOCALIZER X2;  Surgeon: Virl Cagey, MD;  Location: [facility];  Service: General;  Laterality: Left;   LYMPH NODE DISSECTION N/A 03/19/2022   Procedure: LYMPH NODE DISSECTION;  Surgeon: [name], MD;  [facility]: [facility];  Service: Gynecology;  Laterality: N/A;   NASAL SEPTUM SURGERY     ROBOTIC ASSISTED TOTAL HYSTERECTOMY WITH BILATERAL SALPINGO OOPHERECTOMY Bilateral 03/19/2022   Procedure: XI ROBOTIC ASSISTED TOTAL HYSTERECTOMY WITH BILATERAL SALPINGO OOPHORECTOMY;  Surgeon: [name], MD;  Location: [facility];  Service: Gynecology;  Laterality: Bilateral;   SENTINEL NODE BIOPSY N/A 03/19/2022   Procedure: SENTINEL NODE BIOPSY;  Surgeon: [name], MD;  [facility];  Service:  Gynecology;  Laterality: N/A;   SHOULDER ARTHROSCOPY WITH ROTATOR CUFF REPAIR AND OPEN BICEPS TENODESIS Right 07/15/2022   Procedure: RIGHT SHOULDER ARTHROSCOPY WITH ROTATOR CUFF REPAIR AND OPEN BICEPS TENODESIS;  Surgeon: [name], MD;  Location: [facility];  Service: Orthopedics;  Laterality: Right;   TUBAL LIGATION     WISDOM TOOTH EXTRACTION     Patient Active Problem List   Diagnosis Date Noted   Traumatic complete tear of right rotator cuff    Biceps tendinitis of right upper extremity    Endometrial cancer ([place]) 03/07/2022   PMB (postmenopausal bleeding) 03/07/2022   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   Flat epithelial atypia of breast, left    Atypical lobular hyperplasia of left breast 11/28/2021   History of UTI 11/07/2021   Gross hematuria 10/16/2021   Chronic back pain 12/19/2014   Edema 04/16/2014   Acute sinusitis 01/13/2014   Perimenopause 08/22/2013   Morbid obesity ([place]) 08/22/2013   Unspecified vitamin D deficiency 05/19/2013   Neuropathy 05/19/2013   Fibromyalgia 05/19/2013   Asthma with acute exacerbation 04/21/2013    ONSET DATE: 07/15/22  REFERRING DIAG: Q68.341D (ICD-10-CM) - Traumatic complete tear of right rotator cuff, initial encounter  THERAPY DIAG:  Acute pain of right shoulder  Other symptoms and signs involving the musculoskeletal system  Rationale for Evaluation and  Treatment Rehabilitation  SUBJECTIVE:   SUBJECTIVE STATEMENT: S: "I haven't been able to do my exercises as much and I definitely feel it."  PRECAUTIONS: Shoulder  WEIGHT BEARING RESTRICTIONS Yes NWB  PAIN:  Are you having pain? Yes: NPRS scale: 6/10 Pain location: superior and posterior shoulder and deltoid  region  Pain description: aching  Aggravating factors: sleep, movement  Relieving factors: ibuprofen   OBJECTIVE:   HAND DOMINANCE: Left  FUNCTIONAL OUTCOME MEASURES: FOTO: 36.13  UPPER EXTREMITY ROM      *Measurements taken with pt in supine position. Adducted IR/er. Passive ROM Right eval  Shoulder flexion 92  Shoulder abduction 69  Shoulder internal rotation 90  Shoulder external rotation 65  (Blank rows = not tested)  Active ROM Right eval  Shoulder flexion   Shoulder abduction   Shoulder internal rotation   Shoulder external rotation   Elbow flexion   Elbow extension   (Blank rows = not tested)    UPPER EXTREMITY MMT:     MMT Right eval  Shoulder flexion   Shoulder abduction   Shoulder internal rotation   Shoulder external rotation   Elbow flexion   Elbow extension   (Blank rows = not tested)   SENSATION: Pt reports some tingling/loss of sensation in her entire hand   COGNITION: Overall cognitive status: Within functional limits for tasks assessed   OBSERVATIONS: Fascial restrictions noted throughout UE, mostly noted at upper trapezius, biceps, and deltoids   TODAY'S TREATMENT:  09/22/22 -Manual Therapy: myofascial release, trigger point massage to the R shoulder girdle and scapular region, to address fascial restrictions, in order to reduce pain and improve ROM. -A/ROM: Supine, flexion, abduction, horizontal abduction, protraction, er/IR, 1x10 -AA/ROM: Seated, flexion, abduction, horizontal abduction, protraction, er/IR, 2lb dowel rod, 1x10 -Proximal Shoulder strengthening: arms on fire 1x45", ball on the wall (flexion and abduction) 1x45" -Wall Slides: flexion and abduction, 1x10  09/16/22 -Manual Therapy: myofascial release, trigger point massage to address fascial restrictions, in order to reduce pain and improve ROM. -AA/ROM: flexion, abduction, protraction, er/IR, 1x10  -A/ROM: flexion, abduction, er/IR, 1x10 -Scapula strengthening: retraction, rows, extension, protraction  09/11/22 -Manual Therapy: myofascial release, trigger point massage to address fascial restrictions, in order to reduce pain and improve ROM. -AA/ROM: flexion, abduction,  protraction, er/IR, 1x10  -Isometrics: flexion, extension, abduction, adduction, er/IR, 5x10" -Wall Washing: flexion and abduction, 1x10 -Proximal shoulder strengthening: ball on the wall 1x45"  PATIENT EDUCATION: Education details: A/ROM and English as a second language teacher Person educated: Patient Education method: Explanation, Demonstration, and Handouts Education comprehension: verbalized understanding and returned demonstration   HOME EXERCISE PROGRAM: Eval: AA/ROM 9/19: table slides, wall slides 9/26: A/ROM and Scap strengthening (red Theraband)  GOALS: Goals reviewed with patient? Yes  SHORT TERM GOALS: Target date:  09/19/22     Pt will be provided with and educated on HEP to improve mobility in RUE required for ADL completion.   Goal status: IN PROGRESS  2.  Pt will decrease pain in RUE to 2/10 or less in order to sleep for 4+ consecutive hours without waking due to pain.   Goal status: IN PROGRESS  3.  Pt will increase P/ROM in RUE to [facility] to improve ability to perform dressing tasks with minimal compensatory techniques.   Goal status: IN PROGRESS  4.  Pt will increase strength in RUE to 3/5 to improve ability to perform light activity at waist height.  Goal status: IN PROGRESS    LONG TERM GOALS: Target date:  10/03/22  Pt will decrease pain in RUE to 0/10 or less in order to complete daily tasks such as meal preparation and showering without requiring rest breaks due to increased pain.   Goal status: IN PROGRESS  2.  Pt will decrease RUE fascial restrictions to minimal amounts or less to improve mobility required for overhead reaching tasks.    Goal status: IN PROGRESS  3.  Pt will increase A/ROM of RUE to [facility] to improve ability to reach overhead and behind back during dressing and bathing tasks.   Goal status: IN PROGRESS  4.  Pt will increase strength in RUE to 4+/5 to improve ability to perform lifting tasks required for lifting pots and pans for meal  preparation.   Goal status: IN PROGRESS   ASSESSMENT:  CLINICAL IMPRESSION: Pt presenting to therapy with increased fascial restrictions in her biceps and axillary region. She is complaining of up to 8/10 pain this session with movement. Session focused on ROM, stretching, and stability exercises. Pt demonstrates improved ROM this session, however still visibly weak. Therapist providing mod multimodal cuing for proper positioning and mechanics, as well as to reduce elbow bending throughout most movements, as a compensatory strategy. She continues to benefit from skilled OT to improve strength and ROM, in order to maximize independence with ADL's and overall UE function.   PLAN: OT FREQUENCY: 2x/week  OT DURATION: 6 weeks  PLANNED INTERVENTIONS: self care/ADL training, therapeutic exercise, therapeutic activity, manual therapy, scar mobilization, manual lymph drainage, passive range of motion, moist heat, cryotherapy, patient/family education, energy conservation, coping strategies training, and DME and/or AE instructions  CONSULTED AND AGREED WITH PLAN OF CARE: Patient  PLAN FOR NEXT SESSION: Manual, follow up on HEP, AA/ROM, table slides, A/ROM, scap strengthening, proximal shoulder strengthening, isometrics, mini reassessment   Paulita Fujita, OTR/L [phone]  09/22/2022, 3:13 PM

## 2022-09-23 ENCOUNTER — Ambulatory Visit (HOSPITAL_COMMUNITY)
Admission: RE | Admit: 2022-09-23 | Discharge: 2022-09-23 | Disposition: A | Discharge status: Home / Self Care | Payer: 59 | Source: Ambulatory Visit | Attending: Hematology | Admitting: Hematology

## 2022-09-23 ENCOUNTER — Inpatient Hospital Stay: Payer: 59 | Attending: Hematology

## 2022-09-23 DIAGNOSIS — Z8049 Family history of malignant neoplasm of other genital organs: Secondary | ICD-10-CM | POA: Insufficient documentation

## 2022-09-23 DIAGNOSIS — Z90710 Acquired absence of both cervix and uterus: Secondary | ICD-10-CM | POA: Diagnosis not present

## 2022-09-23 DIAGNOSIS — D0592 Unspecified type of carcinoma in situ of left breast: Secondary | ICD-10-CM

## 2022-09-23 DIAGNOSIS — Z8542 Personal history of malignant neoplasm of other parts of uterus: Secondary | ICD-10-CM | POA: Diagnosis not present

## 2022-09-23 DIAGNOSIS — E559 Vitamin D deficiency, unspecified: Secondary | ICD-10-CM | POA: Insufficient documentation

## 2022-09-23 DIAGNOSIS — N951 Menopausal and female climacteric states: Secondary | ICD-10-CM | POA: Insufficient documentation

## 2022-09-23 DIAGNOSIS — M8580 Other specified disorders of bone density and structure, unspecified site: Secondary | ICD-10-CM | POA: Insufficient documentation

## 2022-09-23 DIAGNOSIS — Z86000 Personal history of in-situ neoplasm of breast: Secondary | ICD-10-CM | POA: Insufficient documentation

## 2022-09-23 DIAGNOSIS — R69 Illness, unspecified: Secondary | ICD-10-CM | POA: Diagnosis not present

## 2022-09-23 DIAGNOSIS — D0512 Intraductal carcinoma in situ of left breast: Secondary | ICD-10-CM

## 2022-09-23 DIAGNOSIS — F410 Panic disorder [episodic paroxysmal anxiety] without agoraphobia: Secondary | ICD-10-CM | POA: Diagnosis not present

## 2022-09-23 LAB — COMPREHENSIVE METABOLIC PANEL
ALT: 22 U/L (ref 0–44)
AST: 28 U/L (ref 15–41)
Albumin: 3.9 g/dL (ref 3.5–5.0)
Alkaline Phosphatase: 96 U/L (ref 38–126)
Anion gap: 9 (ref 5–15)
BUN: 20 mg/dL (ref 6–20)
CO2: 26 mmol/L (ref 22–32)
Calcium: 9.4 mg/dL (ref 8.9–10.3)
Chloride: 103 mmol/L (ref 98–111)
Creatinine, Ser: 0.74 mg/dL (ref 0.44–1.00)
GFR, Estimated: >60 mL/min (ref >60)
Glucose, Bld: 116 mg/dL — ABNORMAL HIGH (ref 70–99)
Potassium: 3.9 mmol/L (ref 3.5–5.1)
Sodium: 138 mmol/L (ref 135–145)
Total Bilirubin: 1 mg/dL (ref 0.3–1.2)
Total Protein: 7.6 g/dL (ref 6.5–8.1)

## 2022-09-23 LAB — CBC WITH DIFFERENTIAL/PLATELET
Abs Immature Granulocytes: 0.03 10*3/uL (ref 0.00–0.07)
Basophils Absolute: 0.1 10*3/uL (ref 0.0–0.1)
Basophils Relative: 1 %
Eosinophils Absolute: 0.4 10*3/uL (ref 0.0–0.5)
Eosinophils Relative: 6 %
HCT: 36.9 % (ref 36.0–46.0)
Hemoglobin: 11.9 g/dL — ABNORMAL LOW (ref 12.0–15.0)
Immature Granulocytes: 1 %
Lymphocytes Relative: 16 %
Lymphs Abs: 1 10*3/uL (ref 0.7–4.0)
MCH: 30.1 pg (ref 26.0–34.0)
MCHC: 32.2 g/dL (ref 30.0–36.0)
MCV: 93.4 fL (ref 80.0–100.0)
Monocytes Absolute: 0.4 10*3/uL (ref 0.1–1.0)
Monocytes Relative: 6 %
Neutro Abs: 4.6 10*3/uL (ref 1.7–7.7)
Neutrophils Relative %: 70 %
Platelets: 244 10*3/uL (ref 150–400)
RBC: 3.95 MIL/uL (ref 3.87–5.11)
RDW: 13.2 % (ref 11.5–15.5)
WBC: 6.4 10*3/uL (ref 4.0–10.5)
nRBC: 0 % (ref 0.0–0.2)

## 2022-09-23 LAB — VITAMIN D 25 HYDROXY (VIT D DEFICIENCY, FRACTURES): Vit D, 25-Hydroxy: 29.96 ng/mL — ABNORMAL LOW (ref 30–100)

## 2022-09-24 ENCOUNTER — Ambulatory Visit (HOSPITAL_COMMUNITY): Payer: 59 | Admitting: Occupational Therapy

## 2022-09-25 ENCOUNTER — Ambulatory Visit (HOSPITAL_COMMUNITY): Payer: 59 | Admitting: Occupational Therapy

## 2022-09-25 DIAGNOSIS — R29898 Other symptoms and signs involving the musculoskeletal system: Secondary | ICD-10-CM | POA: Diagnosis not present

## 2022-09-25 DIAGNOSIS — M25511 Pain in right shoulder: Secondary | ICD-10-CM | POA: Diagnosis not present

## 2022-09-25 NOTE — Therapy (Signed)
OUTPATIENT OCCUPATIONAL THERAPY ORTHO TREATMENT  Patient Name: Tammy Calderon MRN: [id number] DOB:1966-06-01, 56 y.o., female Today's Date: 09/26/2022  PCP: Sallee Lange, MD  REFERRING PROVIDER: [name], MD   OT End of Session - 09/26/22 1447     Visit Number 6    Number of Visits 13    Date for OT Re-Evaluation 10/03/22   Mini reassessment 9/29   Authorization Type [employer]    Authorization Time Period 30 visits OT/PT/SLP combined, auth required after 30th    Progress Note Due on Visit 10    OT Start Time 1446    OT Stop Time 1527    OT Time Calculation (min) 41 min    Activity Tolerance Patient tolerated treatment well    Behavior During Therapy WFL for tasks assessed/performed             Past Medical History:  Diagnosis Date   Anemia    past   Asthma    uses albuterol approx 1-2x a week   Bell's palsy    resolved   Breast cancer ([place])    Fibromyalgia    GERD (gastroesophageal reflux disease)    Hypertension    PONV (postoperative nausea and vomiting)    Past Surgical History:  Procedure Laterality Date   ABDOMINAL HYSTERECTOMY     BREAST BIOPSY WITH RADIO FREQUENCY LOCALIZER Left 12/27/2021   Procedure: EXCISIONAL BREAST BIOPSY WITH RADIO FREQUENCY LOCALIZER X2;  Surgeon: Virl Cagey, MD;  Location: [facility];  Service: General;  Laterality: Left;   LYMPH NODE DISSECTION N/A 03/19/2022   Procedure: LYMPH NODE DISSECTION;  Surgeon: [name], MD;  Location: [facility];  Service: Gynecology;  Laterality: N/A;   NASAL SEPTUM SURGERY     ROBOTIC ASSISTED TOTAL HYSTERECTOMY WITH BILATERAL SALPINGO OOPHERECTOMY Bilateral 03/19/2022   Procedure: XI ROBOTIC ASSISTED TOTAL HYSTERECTOMY WITH BILATERAL SALPINGO OOPHORECTOMY;  Surgeon: [name], MD;  Location: [facility];  Service: Gynecology;  Laterality: Bilateral;   SENTINEL NODE BIOPSY N/A 03/19/2022   Procedure: SENTINEL NODE BIOPSY;  Surgeon: [name], MD;  Location: [facility];  Service:  Gynecology;  Laterality: N/A;   SHOULDER ARTHROSCOPY WITH ROTATOR CUFF REPAIR AND OPEN BICEPS TENODESIS Right 07/15/2022   Procedure: RIGHT SHOULDER ARTHROSCOPY WITH ROTATOR CUFF REPAIR AND OPEN BICEPS TENODESIS;  Surgeon: [name], MD;  Location: [place];  Service: Orthopedics;  Laterality: Right;   TUBAL LIGATION     WISDOM TOOTH EXTRACTION     Patient Active Problem List   Diagnosis Date Noted   Traumatic complete tear of right rotator cuff    Biceps tendinitis of right upper extremity    Endometrial cancer (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   Flat epithelial atypia of breast, left    Atypical lobular hyperplasia of left breast 11/28/2021   History of UTI 11/07/2021   Gross hematuria 10/16/2021   Chronic back pain 12/19/2014   Edema 04/16/2014   Acute sinusitis 01/13/2014   Perimenopause 08/22/2013   Morbid obesity ([place]) 08/22/2013   Unspecified vitamin D deficiency 05/19/2013   Neuropathy 05/19/2013   Fibromyalgia 05/19/2013   Asthma with acute exacerbation 04/21/2013    ONSET DATE: 07/15/22  REFERRING DIAG: F79.024O (ICD-10-CM) - Traumatic complete tear of right rotator cuff, initial encounter  THERAPY DIAG:  Acute pain of right shoulder  Other symptoms and signs involving the musculoskeletal system  Rationale for Evaluation and  Treatment Rehabilitation  SUBJECTIVE:   SUBJECTIVE STATEMENT: S: "I'm feeling really weak today"  PRECAUTIONS: Shoulder  WEIGHT BEARING RESTRICTIONS Yes NWB  PAIN:  Are you having pain? Yes: NPRS scale: 6/10 Pain location: superior and posterior shoulder and deltoid  region  Pain description: aching  Aggravating factors: sleep, movement  Relieving factors: ibuprofen   OBJECTIVE:   HAND DOMINANCE: Left  FUNCTIONAL OUTCOME MEASURES: FOTO: 36.13  UPPER EXTREMITY ROM     *Measurements taken with pt in supine position.  Adducted IR/er. Passive ROM Right eval  Shoulder flexion 92  Shoulder abduction 69  Shoulder internal rotation 90  Shoulder external rotation 65  (Blank rows = not tested)  Active ROM Right eval  Shoulder flexion   Shoulder abduction   Shoulder internal rotation   Shoulder external rotation   Elbow flexion   Elbow extension   (Blank rows = not tested)    UPPER EXTREMITY MMT:     MMT Right eval  Shoulder flexion   Shoulder abduction   Shoulder internal rotation   Shoulder external rotation   Elbow flexion   Elbow extension   (Blank rows = not tested)   SENSATION: Pt reports some tingling/loss of sensation in her entire hand   COGNITION: Overall cognitive status: Within functional limits for tasks assessed   OBSERVATIONS: Fascial restrictions noted throughout UE, mostly noted at upper trapezius, biceps, and deltoids   TODAY'S TREATMENT:  09/25/22 -Manual Therapy: myofascial release, trigger point massage to the R shoulder girdle and scapular region, to address fascial restrictions, in order to reduce pain and improve ROM. -A/ROM: Supine, flexion, abduction, horizontal abduction, protraction, er/IR, 1x10 -AA/ROM: Seated, flexion, abduction, horizontal abduction, protraction, er/IR, 3lb dowel rod, 1x10 -Shoulder strengthening: no weight, flexion, abduction, protraction, er/IR, 1x10 -Strengthening: Bicep curls, tricep extensions, 2lb dumbbell, 1x10 -UBE: Level 1, pace 2.0, 2 min forward, 2 min backward  09/22/22 -Manual Therapy: myofascial release, trigger point massage to the R shoulder girdle and scapular region, to address fascial restrictions, in order to reduce pain and improve ROM. -A/ROM: Supine, flexion, abduction, horizontal abduction, protraction, er/IR, 1x10 -AA/ROM: Seated, flexion, abduction, horizontal abduction, protraction, er/IR, 2lb dowel rod, 1x10 -Proximal Shoulder strengthening: arms on fire 1x45", ball on the wall (flexion and abduction)  1x45" -Wall Slides: flexion and abduction, 1x10  09/16/22 -Manual Therapy: myofascial release, trigger point massage to address fascial restrictions, in order to reduce pain and improve ROM. -AA/ROM: flexion, abduction, protraction, er/IR, 1x10  -A/ROM: flexion, abduction, er/IR, 1x10 -Scapula strengthening: retraction, rows, extension, protraction  PATIENT EDUCATION: Education details: A/ROM and English as a second language teacher Person educated: Patient Education method: Explanation, Demonstration, and Handouts Education comprehension: verbalized understanding and returned demonstration   HOME EXERCISE PROGRAM: Eval: AA/ROM 9/19: table slides, wall slides 9/26: A/ROM and Scap strengthening (red Theraband)  GOALS: Goals reviewed with patient? Yes  SHORT TERM GOALS: Target date:  09/19/22     Pt will be provided with and educated on HEP to improve mobility in RUE required for ADL completion.   Goal status: IN PROGRESS  2.  Pt will decrease pain in RUE to 2/10 or less in order to sleep for 4+ consecutive hours without waking due to pain.   Goal status: IN PROGRESS  3.  Pt will increase P/ROM in RUE to [facility] to improve ability to perform dressing tasks with minimal compensatory techniques.   Goal status: IN PROGRESS  4.  Pt will increase strength in RUE to 3/5 to improve ability to perform light activity at waist  height.  Goal status: IN PROGRESS    LONG TERM GOALS: Target date:  10/03/22     Pt will decrease pain in RUE to 0/10 or less in order to complete daily tasks such as meal preparation and showering without requiring rest breaks due to increased pain.   Goal status: IN PROGRESS  2.  Pt will decrease RUE fascial restrictions to minimal amounts or less to improve mobility required for overhead reaching tasks.    Goal status: IN PROGRESS  3.  Pt will increase A/ROM of RUE to [facility] to improve ability to reach overhead and behind back during dressing and bathing tasks.   Goal  status: IN PROGRESS  4.  Pt will increase strength in RUE to 4+/5 to improve ability to perform lifting tasks required for lifting pots and pans for meal preparation.   Goal status: IN PROGRESS   ASSESSMENT:  CLINICAL IMPRESSION: Pt reporting increased weakness this session. She was unable to tolerate weighted dumbbell exercises this session and completed them without weights in standing. Therapist providing verbal cuing to decrease elbow extension and for proper technique and positioning with all exercises. Therapist adding UBE this session to work on UE endurance as well as strength. She continues to benefit from skilled OT to improve strength and ROM, in order to maximize independence with ADL's and overall UE function.   PLAN: OT FREQUENCY: 2x/week  OT DURATION: 6 weeks  PLANNED INTERVENTIONS: self care/ADL training, therapeutic exercise, therapeutic activity, manual therapy, scar mobilization, manual lymph drainage, passive range of motion, moist heat, cryotherapy, patient/family education, energy conservation, coping strategies training, and DME and/or AE instructions  CONSULTED AND AGREED WITH PLAN OF CARE: Patient  PLAN FOR NEXT SESSION: Manual, follow up on HEP, AA/ROM, table slides, A/ROM, scap strengthening, proximal shoulder strengthening, isometrics, mini reassessment   Paulita Fujita, OTR/L [phone]  09/26/2022, 2:48 PM

## 2022-09-26 ENCOUNTER — Encounter (HOSPITAL_COMMUNITY): Payer: Self-pay | Admitting: Occupational Therapy

## 2022-09-30 ENCOUNTER — Ambulatory Visit (HOSPITAL_COMMUNITY): Payer: 59 | Admitting: Occupational Therapy

## 2022-09-30 DIAGNOSIS — R29898 Other symptoms and signs involving the musculoskeletal system: Secondary | ICD-10-CM

## 2022-09-30 DIAGNOSIS — M25511 Pain in right shoulder: Secondary | ICD-10-CM

## 2022-09-30 NOTE — Therapy (Signed)
OUTPATIENT OCCUPATIONAL THERAPY ORTHO TREATMENT AND MINI REASSESSMENT  Patient Name: Tammy Calderon MRN: [id number] DOB:Jun 20, 1966, 56 y.o., female Today's Date: 10/02/2022  PCP: Sallee Lange, MD  REFERRING PROVIDER: [name], MD  End of Session:  09/30/22 1515  OT Visits / Re-Eval  Visit Number 7  Number of Visits 13  Date for OT Re-Evaluation 10/03/22  Authorization  Authorization Type [employer]  Authorization Time Period 30 visits OT/PT/SLP combined, auth required after 30th  Progress Note Due on Visit 10  OT Time Calculation  OT Start Time 1430  OT Stop Time 1515  OT Time Calculation (min) 45 min  End of Session  Activity Tolerance Patient tolerated treatment well  Behavior During Therapy WFL for tasks assessed/performed     Past Medical History:  Diagnosis Date   Anemia    past   Asthma    uses albuterol approx 1-2x a week   Bell's palsy    resolved   Breast cancer ([place])    Fibromyalgia    GERD (gastroesophageal reflux disease)    Hypertension    PONV (postoperative nausea and vomiting)    Past Surgical History:  Procedure Laterality Date   ABDOMINAL HYSTERECTOMY     BREAST BIOPSY WITH RADIO FREQUENCY LOCALIZER Left 12/27/2021   Procedure: EXCISIONAL BREAST BIOPSY WITH RADIO FREQUENCY LOCALIZER X2;  Surgeon: Virl Cagey, MD;  Location: [facility];  Service: General;  Laterality: Left;   LYMPH NODE DISSECTION N/A 03/19/2022   Procedure: LYMPH NODE DISSECTION;  Surgeon: [name], MD;  Location: [facility];  Service: Gynecology;  Laterality: N/A;   NASAL SEPTUM SURGERY     ROBOTIC ASSISTED TOTAL HYSTERECTOMY WITH BILATERAL SALPINGO OOPHERECTOMY Bilateral 03/19/2022   Procedure: XI ROBOTIC ASSISTED TOTAL HYSTERECTOMY WITH BILATERAL SALPINGO OOPHORECTOMY;  Surgeon: [name], MD;  Location: [facility];  Service: Gynecology;  Laterality: Bilateral;   SENTINEL NODE BIOPSY N/A 03/19/2022   Procedure: SENTINEL NODE BIOPSY;  Surgeon: [name], MD;  [facility];  Service: Gynecology;  Laterality: N/A;   SHOULDER ARTHROSCOPY WITH ROTATOR CUFF REPAIR AND OPEN BICEPS TENODESIS Right 07/15/2022   Procedure: RIGHT SHOULDER ARTHROSCOPY WITH ROTATOR CUFF REPAIR AND OPEN BICEPS TENODESIS;  Surgeon: [name], MD;  Location: [place];  Service: Orthopedics;  Laterality: Right;   TUBAL LIGATION     WISDOM TOOTH EXTRACTION     Patient Active Problem List   Diagnosis Date Noted   Traumatic complete tear of right rotator cuff    Biceps tendinitis of right upper extremity    Endometrial cancer ([place]) 03/07/2022   PMB (postmenopausal bleeding) 03/07/2022   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   Flat epithelial atypia of breast, left    Atypical lobular hyperplasia of left breast 11/28/2021   History of UTI 11/07/2021   Gross hematuria 10/16/2021   Chronic back pain 12/19/2014   Edema 04/16/2014   Acute sinusitis 01/13/2014   Perimenopause 08/22/2013   Morbid obesity ([place]) 08/22/2013   Unspecified vitamin D deficiency 05/19/2013   Neuropathy 05/19/2013   Fibromyalgia 05/19/2013   Asthma with acute exacerbation 04/21/2013    ONSET DATE: 07/15/22  REFERRING DIAG: E93.810F (ICD-10-CM) - Traumatic complete tear of right rotator cuff, initial encounter  THERAPY DIAG:  Acute pain of right shoulder  Other symptoms and signs involving the musculoskeletal system  Rationale for Evaluation and Treatment Rehabilitation  SUBJECTIVE:   SUBJECTIVE STATEMENT: S: "I was able to fix my own hair for the  first time today!"  PRECAUTIONS: Shoulder  WEIGHT BEARING RESTRICTIONS Yes NWB  PAIN:  Are you having pain? Yes: NPRS scale: 5/10 Pain location: superior and posterior shoulder and deltoid  region  Pain description: aching  Aggravating factors: sleep, movement  Relieving factors: ibuprofen   OBJECTIVE:   HAND DOMINANCE: Left  FUNCTIONAL OUTCOME MEASURES: FOTO: 36.13 10/10 FOTO:  57.99  UPPER EXTREMITY ROM     *Measurements taken with pt in supine position. Adducted IR/er. Passive ROM Right eval Right 10/10  Shoulder flexion 92 160  Shoulder abduction 69 154  Shoulder internal rotation 90 90  Shoulder external rotation 65 84  (Blank rows = not tested)  Active ROM Right eval Right 10/10  Shoulder flexion  168  Shoulder abduction  140  Shoulder internal rotation  90  Shoulder external rotation  88  (Blank rows = not tested)    UPPER EXTREMITY MMT:     MMT Right eval Right 10/10  Shoulder flexion  4-/5  Shoulder abduction  4-/5  Shoulder internal rotation  5/5  Shoulder external rotation  5/5  Elbow flexion  5/5  Elbow extension  4+/5  (Blank rows = not tested)   SENSATION: Pt reports some tingling/loss of sensation in her entire hand 10/10: all tingling and loss of sensation has stopped.    COGNITION: Overall cognitive status: Within functional limits for tasks assessed   OBSERVATIONS: Fascial restrictions noted throughout UE, mostly noted at upper trapezius, biceps, and deltoids   TODAY'S TREATMENT:   09/30/22 -Manual Therapy: myofascial release, trigger point massage to the R shoulder girdle and scapular region, to address fascial restrictions, in order to reduce pain and improve ROM. -P/ROM: Supine, flexion, abduction, er/IR, 1x10 -A/ROM: Supine, flexion, abduction, horizontal abduction, protraction, er/IR, 1x10 -Shoulder strengthening: 1lb dumbbell, flexion, abduction, protraction, horizontal abduction, er/IR, 1x10 -Strengthening: Bicep curls, tricep extensions, 2lb dumbbell, 1x10 -Scapula Strengthing: rows, retraction, protraction, extension, red theraband, 1x10  09/25/22 -Manual Therapy: myofascial release, trigger point massage to the R shoulder girdle and scapular region, to address fascial restrictions, in order to reduce pain and improve ROM. -A/ROM: Supine, flexion, abduction, horizontal abduction, protraction, er/IR,  1x10 -AA/ROM: Seated, flexion, abduction, horizontal abduction, protraction, er/IR, 3lb dowel rod, 1x10 -Shoulder strengthening: no weight, flexion, abduction, protraction, er/IR, 1x10 -Strengthening: Bicep curls, tricep extensions, 2lb dumbbell, 1x10 -UBE: Level 1, pace 2.0, 2 min forward, 2 min backward  09/22/22 -Manual Therapy: myofascial release, trigger point massage to the R shoulder girdle and scapular region, to address fascial restrictions, in order to reduce pain and improve ROM. -A/ROM: Supine, flexion, abduction, horizontal abduction, protraction, er/IR, 1x10 -AA/ROM: Seated, flexion, abduction, horizontal abduction, protraction, er/IR, 2lb dowel rod, 1x10 -Proximal Shoulder strengthening: arms on fire 1x45", ball on the wall (flexion and abduction) 1x45" -Wall Slides: flexion and abduction, 1x10   PATIENT EDUCATION: Education details: Manufacturing engineer Person educated: Patient Education method: Explanation, Demonstration, and Handouts Education comprehension: verbalized understanding and returned demonstration   HOME EXERCISE PROGRAM: Eval: AA/ROM 9/19: table slides, wall slides 9/26: A/ROM and Scap strengthening (red Theraband) 10/10: Shoulder strengthening  GOALS: Goals reviewed with patient? Yes  SHORT TERM GOALS: Target date:  09/19/22     Pt will be provided with and educated on HEP to improve mobility in RUE required for ADL completion.   Goal status: IN PROGRESS  2.  Pt will decrease pain in RUE to 2/10 or less in order to sleep for 4+ consecutive hours without waking due to pain.   Goal status:  IN PROGRESS  3.  Pt will increase P/ROM in RUE to [facility] to improve ability to perform dressing tasks with minimal compensatory techniques.   Goal status: MET  4.  Pt will increase strength in RUE to 3/5 to improve ability to perform light activity at waist height.  Goal status: MET    LONG TERM GOALS: Target date:  10/03/22     Pt will decrease pain in  RUE to 0/10 or less in order to complete daily tasks such as meal preparation and showering without requiring rest breaks due to increased pain.   Goal status: IN PROGRESS  2.  Pt will decrease RUE fascial restrictions to minimal amounts or less to improve mobility required for overhead reaching tasks.    Goal status: IN PROGRESS  3.  Pt will increase A/ROM of RUE to [facility] to improve ability to reach overhead and behind back during dressing and bathing tasks.   Goal status: IN PROGRESS  4.  Pt will increase strength in RUE to 4+/5 to improve ability to perform lifting tasks required for lifting pots and pans for meal preparation.   Goal status: IN PROGRESS   ASSESSMENT:  CLINICAL IMPRESSION: Pt presented to therapy this session with improved pain levels, as well as improved ROM and endurance to allow her to fix her own hair. Pt was able to complete multiple strengthening exercises this session, with therapist providing verbal cuing and intermittent tactile cuing for proper technique and positioning. Additionally, pt completed a mini reassessment this session, where she demonstrated improved P/ROM, A/ROM, and strength from evaluation, meeting 2 short term goals for P/ROM and strength. She continues to work on limiting pain and finding a comfortable position for sleeping, as well as following up on her HEP. She continues to benefit from skilled OT to improve strength and ROM, in order to maximize independence with ADL's and overall UE function.   PLAN: OT FREQUENCY: 2x/week  OT DURATION: 6 weeks  PLANNED INTERVENTIONS: self care/ADL training, therapeutic exercise, therapeutic activity, manual therapy, scar mobilization, manual lymph drainage, passive range of motion, moist heat, cryotherapy, patient/family education, energy conservation, coping strategies training, and DME and/or AE instructions  CONSULTED AND AGREED WITH PLAN OF CARE: Patient  PLAN FOR NEXT SESSION: Manual, follow up on  HEP, AA/ROM, table slides, A/ROM, scap strengthening, proximal shoulder strengthening, isometrics, shoulder strengthening   Paulita Fujita, OTR/L [phone]  10/02/2022, 9:21 AM

## 2022-09-30 NOTE — Patient Instructions (Signed)

## 2022-10-01 ENCOUNTER — Other Ambulatory Visit: Payer: Self-pay

## 2022-10-01 ENCOUNTER — Other Ambulatory Visit (HOSPITAL_COMMUNITY): Payer: Self-pay | Admitting: Hematology

## 2022-10-01 ENCOUNTER — Other Ambulatory Visit: Payer: Self-pay | Admitting: Hematology

## 2022-10-01 ENCOUNTER — Encounter: Payer: Self-pay | Admitting: Hematology

## 2022-10-01 ENCOUNTER — Inpatient Hospital Stay (HOSPITAL_BASED_OUTPATIENT_CLINIC_OR_DEPARTMENT_OTHER): Payer: 59 | Admitting: Hematology

## 2022-10-01 VITALS — BP 145/85 | HR 82 | Temp 97.7°F | Resp 18 | Ht 61.02 in | Wt 239.9 lb

## 2022-10-01 DIAGNOSIS — R69 Illness, unspecified: Secondary | ICD-10-CM | POA: Diagnosis not present

## 2022-10-01 DIAGNOSIS — D0512 Intraductal carcinoma in situ of left breast: Secondary | ICD-10-CM

## 2022-10-01 DIAGNOSIS — N951 Menopausal and female climacteric states: Secondary | ICD-10-CM | POA: Diagnosis not present

## 2022-10-01 DIAGNOSIS — M8580 Other specified disorders of bone density and structure, unspecified site: Secondary | ICD-10-CM | POA: Diagnosis not present

## 2022-10-01 DIAGNOSIS — E559 Vitamin D deficiency, unspecified: Secondary | ICD-10-CM | POA: Diagnosis not present

## 2022-10-01 DIAGNOSIS — Z8049 Family history of malignant neoplasm of other genital organs: Secondary | ICD-10-CM | POA: Diagnosis not present

## 2022-10-01 DIAGNOSIS — Z86000 Personal history of in-situ neoplasm of breast: Secondary | ICD-10-CM | POA: Diagnosis not present

## 2022-10-01 DIAGNOSIS — Z8542 Personal history of malignant neoplasm of other parts of uterus: Secondary | ICD-10-CM | POA: Diagnosis not present

## 2022-10-01 DIAGNOSIS — Z90710 Acquired absence of both cervix and uterus: Secondary | ICD-10-CM | POA: Diagnosis not present

## 2022-10-01 NOTE — Progress Notes (Signed)
Patient reports having stopped Anastrozole due to hot flashes, panic attacks and heart palpitations. Dr. Delton Coombes made aware. Positive MST score - patient declines offer for nutritionist, stating that she feels she is managing weight and appetite well at home.

## 2022-10-01 NOTE — Progress Notes (Signed)
[facility] [address] [place] [address]   Patient Care Team: Kathyrn Drown, MD as PCP - General (Family Medicine) Derek Jack, MD as Medical Oncologist (Medical Oncology) Brien Mates, RN as Oncology Nurse Navigator (Medical Oncology)  SUMMARY OF ONCOLOGIC HISTORY: Oncology History Overview Note  MMR IHC intact MS stable   Endometrial cancer ([place])  02/17/2022 Initial Biopsy   EMB: Endometrioid carcinoma, FIGO grade 1.  Prolapsing endocervical mass-benign endocervical polyp.   02/28/2022 Imaging   Pelvic ultrasound shows abnormally thickened heterogenous endometrial complex measuring 18 mm, simple right ovarian cyst.   03/07/2022 Initial Diagnosis   Endometrial cancer ([place])   03/19/2022 Surgery   TRH/BSO, bilateral sentinel lymph node biopsy  Findings: On EUA, small mobile uterus, no adnexal masses. On intra-abdominal entry, normal upper abdominal survey.  Normal-appearing omentum, small and large bowel.  Filmy adhesions of the appendix to the sigmoid epiploica and right pelvic sidewall.  Clips noted on the fallopian tube consistent with prior tubal ligation.  Normal-appearing bilateral adnexa.  Uterus 8 cm and somewhat bulbous at the fundus, otherwise normal in appearance.  Mapping successful bilaterally to obturator sentinel lymph nodes.  Mildly enlarged lymph node on the left with enlarged adjacent lymph node that was also removed.  No obvious extrauterine tumor.  Mild adhesions between the bladder and lower uterine segment/cervix.   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 LIVIER HENDEL is a 56 y.o. female seen for follow-up of left breast DCIS. She can not tolerate arimidex due to panic attacks.  REVIEW OF SYSTEMS:   Review of Systems  Constitutional:  Positive for fatigue.  Negative for appetite change.  Endocrine: Positive for hot flashes.  All other systems reviewed and are negative.   I have reviewed the past medical history, past surgical history, social history and family history with the patient and they are unchanged from previous note.   ALLERGIES:   is allergic to misc. sulfonamide containing compounds, sulfa antibiotics, cymbalta [duloxetine hcl], lyrica [pregabalin], and neurontin [gabapentin].   MEDICATIONS:  Current Outpatient Medications  Medication Sig Dispense Refill   albuterol (VENTOLIN HFA) 108 (90 Base) MCG/ACT inhaler Inhale 2 puffs into the lungs every 6 (six) hours as needed for wheezing. 1 each 2   anastrozole (ARIMIDEX) 1 MG tablet Take 1 tablet (1 mg total) by mouth daily. (Patient not taking: Reported on 08/21/2022) 30 tablet 6   aspirin EC 325 MG tablet Take 1 tablet (325 mg total) by mouth daily. 30 tablet 0   ergocalciferol (VITAMIN D2) 1.25 MG (50000 UT) capsule Take 1 capsule (50,000 Units total) by mouth once a week. 12 capsule 1   escitalopram (LEXAPRO) 10 MG tablet Take 1 tablet (10 mg total) by mouth daily. 30 tablet 6   famotidine (PEPCID) 40 MG tablet Take 1 tablet (40 mg total) by mouth daily. 30 tablet 3   ibuprofen (ADVIL) 600 MG tablet Take 1 tablet (600 mg total) by mouth every 8 (eight) hours as needed. 40 tablet 2   lisinopril-hydrochlorothiazide (ZESTORETIC) 20-12.5 MG tablet TAKE 1 TABLET BY MOUTH ONCE DAILY . APPOINTMENT REQUIRED FOR FUTURE REFILLS 30 tablet 5   lisinopril-hydrochlorothiazide (ZESTORETIC) 20-25 MG tablet Take one tablet po daily 30 tablet 4   senna-docusate (SENOKOT-S) 8.6-50 MG tablet Take 2 tablets by mouth at  bedtime. For AFTER surgery, do not take if having diarrhea 30 tablet 0   traMADol (ULTRAM) 50 MG tablet Take 1 tablet (50 mg total) by mouth every 6 (six) hours as needed. 30 tablet 0   No current facility-administered medications for this visit.     PHYSICAL EXAMINATION: Performance  status (ECOG): 0 - Asymptomatic  Vitals:   10/01/22 1506  BP: (!) 145/85  Pulse: 82  Resp: 18  Temp: 97.7 F (36.5 C)  SpO2: 100%   Wt Readings from Last 3 Encounters:  10/01/22 239 lb 13.8 oz (108.8 kg)  08/21/22 245 lb 9.6 oz (111.4 kg)  07/15/22 243 lb 6.2 oz (110.4 kg)   Physical Exam Vitals reviewed.  Constitutional:      Appearance: Normal appearance. She is obese.  Cardiovascular:     Rate and Rhythm: Normal rate and regular rhythm.     Pulses: Normal pulses.     Heart sounds: Normal heart sounds.  Pulmonary:     Effort: Pulmonary effort is normal.     Breath sounds: Normal breath sounds.  Neurological:     General: No focal deficit present.     Mental Status: She is alert and oriented to person, place, and time.  Psychiatric:        Mood and Affect: Mood normal.        Behavior: Behavior normal.    Breast Exam Chaperone: Thana Ates     LABORATORY DATA:  I have reviewed the data as listed    Latest Ref Rng & Units 09/23/2022   12:55 PM 07/08/2022    3:30 PM 04/03/2022   12:35 PM  CMP  Glucose 70 - 99 mg/dL 116  90  100   BUN 6 - 20 mg/dL $Remove'20  21  24   'LCjWrTk$ Creatinine 0.44 - 1.00 mg/dL 0.74  1.02  0.79   Sodium 135 - 145 mmol/L 138  136  140   Potassium 3.5 - 5.1 mmol/L 3.9  4.3  3.9   Chloride 98 - 111 mmol/L 103  103  106   CO2 22 - 32 mmol/L $RemoveB'26  24  27   'UdZgRRvD$ Calcium 8.9 - 10.3 mg/dL 9.4  9.5  9.5   Total Protein 6.5 - 8.1 g/dL 7.6   7.7   Total Bilirubin 0.3 - 1.2 mg/dL 1.0   0.7   Alkaline Phos 38 - 126 U/L 96   132   AST 15 - 41 U/L 28   20   ALT 0 - 44 U/L 22   19    No results found for: "CAN153" Lab Results  Component Value Date   WBC 6.4 09/23/2022   HGB 11.9 (L) 09/23/2022   HCT 36.9 09/23/2022   MCV 93.4 09/23/2022   PLT 244 09/23/2022   NEUTROABS 4.6 09/23/2022    ASSESSMENT:  Left breast DCIS: - Abnormal screening mammogram on 09/19/2021. - Left breast biopsy 12:30 mass-ALH, 12:00 mass-ALH, 9:00 mass fibroadenoma - Left breast  lumpectomy on 12/27/2021 - Pathology shows DCIS, grade 2, 0.15 cm, margins negative, atypical lobular hyperplasia.  ER shows weak expression and DCIS.  pTis, pNX       -Completed XRT to the left breast around March 2023       - She recently underwent hysterectomy.  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  Does not require any adjuvant therapy.   Social/family history: - Lives at home with her husband.  She works at [employer].  Non-smoker. - She is adopted.  Biological mother died of uterine cancer.   PLAN:  Left breast DCIS: - Mammogram (09/23/2022): BI-RADS Category 2. - she can't take anastrozole due to panic attacks. - labs reviewed were normal. - we will monitor once a year w/ labs and mammogram.  2.  Vitamin D deficiency: -Continue Calcium +D daily. -DEXA was normal from 02/03/22.  3.  Hot flashes: -She continues to have hot flashes after TAH.  Breast Cancer therapy associated bone loss: I have recommended calcium, Vitamin D and weight bearing exercises.  Orders placed this encounter:  No orders of the defined types were placed in this encounter.   The patient has a good understanding of the overall plan. She agrees with it. She will call with any problems that may develop before the next visit here.  Derek Jack, MD [place] [phone]

## 2022-10-01 NOTE — Patient Instructions (Addendum)
[facility]  Discharge Instructions  You were seen and examined today by Dr. Delton Coombes.  Dr. Delton Coombes discussed your most recent lab work which revealed that everything looks okay. Vitamin D is slightly low.   Continue taking Vitamin D and Calcium daily.  Follow-up as scheduled in 1 year with labs and mammogram.    Thank you for choosing [facility] to provide your oncology and hematology care.   To afford each patient quality time with our provider, please arrive at least 15 minutes before your scheduled appointment time. You may need to reschedule your appointment if you arrive late (10 or more minutes). Arriving late affects you and other patients whose appointments are after yours.  Also, if you miss three or more appointments without notifying the office, you may be dismissed from the clinic at the provider's discretion.    Again, thank you for choosing [facility].  Our hope is that these requests will decrease the amount of time that you wait before being seen by our physicians.   If you have a lab appointment with the [facility] please come in thru the Main Entrance and check in at the main information desk.           _____________________________________________________________  Should you have questions after your visit to [facility], please contact our office at [phone] and follow the prompts.  Our office hours are 8:00 a.m. to 4:30 p.m. Monday - Thursday and 8:00 a.m. to 2:30 p.m. Friday.  Please note that voicemails left after 4:00 p.m. may not be returned until the following business day.  We are closed weekends and all major holidays.  You do have access to a nurse 24-7, just call the main number to the clinic [phone] and do not press any options, hold on the line and a nurse will answer the phone.    For prescription refill requests, have your pharmacy contact our office and  allow 72 hours.    Masks are optional in the cancer centers. If you would like for your care team to wear a mask while they are taking care of you, please let them know. You may have one support person who is at least 54 years old accompany you for your appointments.

## 2022-10-02 ENCOUNTER — Other Ambulatory Visit (HOSPITAL_COMMUNITY): Payer: 59

## 2022-10-02 ENCOUNTER — Encounter (HOSPITAL_COMMUNITY): Payer: 59 | Admitting: Occupational Therapy

## 2022-10-02 ENCOUNTER — Encounter (HOSPITAL_COMMUNITY): Payer: Self-pay | Admitting: Occupational Therapy

## 2022-10-07 ENCOUNTER — Ambulatory Visit
Admission: EM | Admit: 2022-10-07 | Discharge: 2022-10-07 | Disposition: A | Discharge status: Home / Self Care | Payer: 59

## 2022-10-07 DIAGNOSIS — K047 Periapical abscess without sinus: Secondary | ICD-10-CM | POA: Diagnosis not present

## 2022-10-07 DIAGNOSIS — K0889 Other specified disorders of teeth and supporting structures: Secondary | ICD-10-CM

## 2022-10-07 MED ORDER — AMOXICILLIN 500 MG PO CAPS: 500.0000 mg | ORAL_CAPSULE | Freq: Three times a day (TID) | ORAL | 0 refills | Status: DC

## 2022-10-07 MED ORDER — ONDANSETRON 4 MG PO TBDP: 4.0000 mg | ORAL_TABLET | Freq: Three times a day (TID) | ORAL | 0 refills | Status: DC | PRN

## 2022-10-07 NOTE — ED Triage Notes (Signed)
Pt reports dental pain, gum swelling, right ear discomfort x 1 day. Pt taking is taking metronidazole.

## 2022-10-07 NOTE — Discharge Instructions (Signed)
  Take medication as prescribed. May take over-the-counter Tylenol Extra Strength 1 to 2 hours after Ibuprofen for breakthrough pain. Warm salt water gargles 3-4 times daily until symptoms improve. Continue warm compresses to the affected area to help with pain and discomfort.  Recommend cool compresses as needed for pain and swelling. I have provided a dental resource guide for you to follow-up with a dentist.  Recommend following up within the next 7 to 10 days.

## 2022-10-07 NOTE — ED Provider Notes (Signed)
[facility]    CSN: [id number] Arrival date & time: 10/07/22  1342      History   Chief Complaint Chief Complaint  Patient presents with   Dental Pain    HPI Tammy Calderon is a 56 y.o. female.   The history is provided by the patient.   Patient presents for complaints of dental pain has been present for the last several months.  Patient states over the last 1 to 2 days, she has had worsening pain.  She states pain is located in the lower right side of her mouth.  States that she broke the tooth a while ago.  She states she has also noticed some swelling to the right side of her face, gum swelling, and right ear pain.  Patient states that she also vomited twice today.  She denies fever, chills, chest pain, abdominal pain, or diarrhea.  States she has been using a prescription strength ibuprofen for her pain.  Patient states that she did reach out to her dentist who she saw a couple weeks ago.  She informs the dentist told her that the tooth has to be surgically removed and that she is now having issues with dental coverage. Past Medical History:  Diagnosis Date   Anemia    past   Asthma    uses albuterol approx 1-2x a week   Bell's palsy    resolved   Breast cancer (HCC)    Fibromyalgia    GERD (gastroesophageal reflux disease)    Hypertension    PONV (postoperative nausea and vomiting)     Patient Active Problem List   Diagnosis Date Noted   Traumatic complete tear of right rotator cuff    Biceps tendinitis of right upper extremity    Endometrial cancer ([place]) 03/07/2022   PMB (postmenopausal bleeding) 03/07/2022   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   Flat epithelial atypia of breast, left    Atypical lobular hyperplasia of left breast 11/28/2021   History of UTI 11/07/2021   Gross hematuria 10/16/2021   Chronic back pain 12/19/2014   Edema 04/16/2014   Acute sinusitis 01/13/2014   Perimenopause 08/22/2013   Morbid obesity ([place])  08/22/2013   Unspecified vitamin D deficiency 05/19/2013   Neuropathy 05/19/2013   Fibromyalgia 05/19/2013   Asthma with acute exacerbation 04/21/2013    Past Surgical History:  Procedure Laterality Date   ABDOMINAL HYSTERECTOMY     BREAST BIOPSY WITH RADIO FREQUENCY LOCALIZER Left 12/27/2021   Procedure: EXCISIONAL BREAST BIOPSY WITH RADIO FREQUENCY LOCALIZER X2;  Surgeon: Virl Cagey, MD;  Location: [facility];  Service: General;  Laterality: Left;   LYMPH NODE DISSECTION N/A 03/19/2022   Procedure: LYMPH NODE DISSECTION;  Surgeon: [name], MD;  Location: [facility];  Service: Gynecology;  Laterality: N/A;   NASAL SEPTUM SURGERY     ROBOTIC ASSISTED TOTAL HYSTERECTOMY WITH BILATERAL SALPINGO OOPHERECTOMY Bilateral 03/19/2022   Procedure: XI ROBOTIC ASSISTED TOTAL HYSTERECTOMY WITH BILATERAL SALPINGO OOPHORECTOMY;  Surgeon: [name], MD;  Location: [facility];  Service: Gynecology;  Laterality: Bilateral;   SENTINEL NODE BIOPSY N/A 03/19/2022   Procedure: SENTINEL NODE BIOPSY;  Surgeon: [name], MD;  Location: [facility];  Service: Gynecology;  Laterality: N/A;   SHOULDER ARTHROSCOPY WITH ROTATOR CUFF REPAIR AND OPEN BICEPS TENODESIS Right 07/15/2022   Procedure: RIGHT SHOULDER ARTHROSCOPY WITH ROTATOR CUFF REPAIR AND OPEN BICEPS TENODESIS;  Surgeon: [name], MD;  Location: [facility];  Service: Orthopedics;  Laterality: Right;   TUBAL LIGATION     WISDOM TOOTH EXTRACTION      OB History     Gravida  2   Para  2   Term      Preterm      AB      Living  2      SAB      IAB      Ectopic      Multiple      Live Births               Home Medications    Prior to Admission medications   Medication Sig Start Date End Date Taking? Authorizing Provider  amoxicillin (AMOXIL) 500 MG capsule Take 1 capsule (500 mg total) by mouth 3 (three) times daily. 10/07/22  Yes Leeasia Secrist-Warren, Alda Lea, NP  METRONIDAZOLE PO Take  by mouth.   Yes [provider]  ondansetron (ZOFRAN-ODT) 4 MG disintegrating tablet Take 1 tablet (4 mg total) by mouth every 8 (eight) hours as needed for nausea or vomiting. 10/07/22  Yes Niala Stcharles-Warren, Alda Lea, NP  albuterol (VENTOLIN HFA) 108 (90 Base) MCG/ACT inhaler Inhale 2 puffs into the lungs every 6 (six) hours as needed for wheezing. 07/24/21   Vanessa Kick, MD  anastrozole (ARIMIDEX) 1 MG tablet Take 1 tablet (1 mg total) by mouth daily. Patient not taking: Reported on 08/21/2022 04/09/22   Derek Jack, MD  aspirin EC 325 MG tablet Take 1 tablet (325 mg total) by mouth daily. Patient not taking: Reported on 10/01/2022 06/04/22   [name], MD  ergocalciferol (VITAMIN D2) 1.25 MG (50000 UT) capsule Take 1 capsule (50,000 Units total) by mouth once a week. 04/09/22   Derek Jack, MD  escitalopram (LEXAPRO) 10 MG tablet Take 1 tablet (10 mg total) by mouth daily. 08/21/22   Kathyrn Drown, MD  famotidine (PEPCID) 40 MG tablet Take 1 tablet (40 mg total) by mouth daily. 08/06/22   Kathyrn Drown, MD  ibuprofen (ADVIL) 600 MG tablet Take 1 tablet (600 mg total) by mouth every 8 (eight) hours as needed. 08/21/22   Kathyrn Drown, MD  lisinopril-hydrochlorothiazide (ZESTORETIC) 20-12.5 MG tablet TAKE 1 TABLET BY MOUTH ONCE DAILY . APPOINTMENT REQUIRED FOR FUTURE REFILLS 08/21/22   Kathyrn Drown, MD  lisinopril-hydrochlorothiazide (ZESTORETIC) 20-25 MG tablet Take one tablet po daily Patient not taking: Reported on 10/01/2022 08/26/22   Kathyrn Drown, MD  senna-docusate (SENOKOT-S) 8.6-50 MG tablet Take 2 tablets by mouth at bedtime. For AFTER surgery, do not take if having diarrhea Patient not taking: Reported on 10/01/2022 03/07/22   Joylene John D, NP  traMADol (ULTRAM) 50 MG tablet Take 1 tablet (50 mg total) by mouth every 6 (six) hours as needed. Patient not taking: Reported on 10/01/2022 05/14/22   [name], MD    Family History Family History   Adopted: Yes  Problem Relation Age of Onset   Uterine cancer Mother     Social History Social History   Tobacco Use   Smoking status: Never   Smokeless tobacco: Never  Vaping Use   Vaping Use: Never used  Substance Use Topics   Alcohol use: No   Drug use: Never     Allergies   Misc. sulfonamide containing compounds, Sulfa antibiotics, Cymbalta [duloxetine hcl], Lyrica [pregabalin], and Neurontin [gabapentin]   Review of Systems Review of Systems Per HPI  Physical Exam Triage Vital Signs ED Triage Vitals  Enc Vitals  Group     BP 10/07/22 1400 (!) 161/87     Pulse Rate 10/07/22 1400 78     Resp 10/07/22 1400 18     Temp 10/07/22 1400 98.2 F (36.8 C)     Temp Source 10/07/22 1400 Oral     SpO2 10/07/22 1400 98 %     Weight --      Height --      Head Circumference --      Peak Flow --      Pain Score 10/07/22 1358 10     Pain Loc --      Pain Edu? --      Excl. in [place]? --    No data found.  Updated Vital Signs BP (!) 161/87 (BP Location: Right Wrist)   Pulse 78   Temp 98.2 F (36.8 C) (Oral)   Resp 18   LMP 10/06/2011   SpO2 98%   Visual Acuity Right Eye Distance:   Left Eye Distance:   Bilateral Distance:    Right Eye Near:   Left Eye Near:    Bilateral Near:     Physical Exam Vitals and nursing note reviewed.  Constitutional:      Appearance: Normal appearance.  HENT:     Head: Normocephalic.     Mouth/Throat:     Lips: Pink.     Dentition: Abnormal dentition. Dental tenderness and gingival swelling present.     Pharynx: Oropharynx is clear. Uvula midline.   Eyes:     Extraocular Movements: Extraocular movements intact.     Conjunctiva/sclera: Conjunctivae normal.     Pupils: Pupils are equal, round, and reactive to light.  Cardiovascular:     Rate and Rhythm: Normal rate and regular rhythm.     Pulses: Normal pulses.     Heart sounds: Normal heart sounds.  Pulmonary:     Effort: Pulmonary effort is normal.     Breath sounds:  Normal breath sounds.  Abdominal:     General: Bowel sounds are normal.     Palpations: Abdomen is soft.  Skin:    General: Skin is warm and dry.  Neurological:     General: No focal deficit present.     Mental Status: She is alert and oriented to person, place, and time.  Psychiatric:        Mood and Affect: Mood normal.        Behavior: Behavior normal.      UC Treatments / Results  Labs (all labs ordered are listed, but only abnormal results are displayed) Labs Reviewed - No data to display  EKG   Radiology No results found.  Procedures Procedures (including critical care time)  Medications Ordered in UC Medications - No data to display  Initial Impression / Assessment and Plan / UC Course  I have reviewed the triage vital signs and the nursing notes.  Pertinent labs & imaging results that were available during my care of the patient were reviewed by me and considered in my medical decision making (see chart for details).  Patient presents for complaints of swelling and pain in the gum in the right lower r portion of her mouth for the past 24 hours.  On exam, patient has gingival swelling to the gum in the right posterior area of the lower mouth.  Symptoms are consistent with dentalgia, but also cannot rule out a dental abscess as patient has also had nausea and vomiting. Will start patient on amoxicillin 500 mg  three times daily for 7 days and zofran 4 mg for nausea and vomiting. Patient was advised to continue Ibuprofen for pain.  Patient was advised to start Tylenol in conjunction with ibuprofen to help with pain control.  Supportive care recommendations were provided to the patient.  Dental resource guide was also provided to the patient and encourage follow-up with a dentist within the next 7 to 10 days.  Patient advised to follow-up as needed.  Final Clinical Impressions(s) / UC Diagnoses   Final diagnoses:  [place] abscess     Discharge Instructions          Take medication as prescribed. May take over-the-counter Tylenol Extra Strength 1 to 2 hours after Ibuprofen for breakthrough pain. Warm salt water gargles 3-4 times daily until symptoms improve. Continue warm compresses to the affected area to help with pain and discomfort.  Recommend cool compresses as needed for pain and swelling. I have provided a dental resource guide for you to follow-up with a dentist.  Recommend following up within the next 7 to 10 days.      ED Prescriptions     Medication Sig Dispense Auth. Provider   amoxicillin (AMOXIL) 500 MG capsule Take 1 capsule (500 mg total) by mouth 3 (three) times daily. 21 capsule Braxtin Bamba-Warren, Alda Lea, NP   ondansetron (ZOFRAN-ODT) 4 MG disintegrating tablet Take 1 tablet (4 mg total) by mouth every 8 (eight) hours as needed for nausea or vomiting. 20 tablet Karlene Southard-Warren, Alda Lea, NP      PDMP not reviewed this encounter.   Tish Men, NP 10/07/22 1437

## 2022-10-09 ENCOUNTER — Ambulatory Visit (HOSPITAL_COMMUNITY): Payer: 59 | Admitting: Hematology

## 2022-10-13 ENCOUNTER — Encounter (HOSPITAL_COMMUNITY): Payer: Self-pay | Admitting: Occupational Therapy

## 2022-10-13 ENCOUNTER — Ambulatory Visit (HOSPITAL_COMMUNITY): Payer: 59 | Admitting: Occupational Therapy

## 2022-10-13 DIAGNOSIS — M25511 Pain in right shoulder: Secondary | ICD-10-CM | POA: Diagnosis not present

## 2022-10-13 DIAGNOSIS — R29898 Other symptoms and signs involving the musculoskeletal system: Secondary | ICD-10-CM | POA: Diagnosis not present

## 2022-10-13 NOTE — Patient Instructions (Addendum)
Theraband strengthening: Complete 10-15X, 1-2X/day  1) Shoulder protraction  Anchor band in doorway, stand with back to door. Push your hand forward as much as you can to bringing your shoulder blades forward on your rib cage.      2) Shoulder horizontal abduction  Standing with a theraband anchored at chest height, begin with arm straight and some tension in the band. Move your arm out to your side (keeping straight the whole time). Bring the affected arm back to midline.     3) Shoulder Internal Rotation  While holding an elastic band at your side with your elbow bent, start with your hand away from your stomach, then pull the band towards your stomach. Keep your elbow near your side the entire time.     4) Shoulder External Rotation  While holding an elastic band at your side with your elbow bent, start with your hand near your stomach and then pull the band away. Keep your elbow at your side the entire time.     5) Shoulder flexion  While standing with back to the door, holding Theraband at hand level, raise arm in front of you.  Keep elbow straight through entire movement.      6) Shoulder abduction  While holding an elastic band at your side, draw up your arm to the side keeping your elbow straight.      7) Towel Stretch with Internal Rotation      Gently pull up (or to the side) your affected arm  behind your back with the assist of a towel. Hold 10 seconds, repeat 3-5 times. 1-2 times/day.

## 2022-10-13 NOTE — Therapy (Signed)
OUTPATIENT OCCUPATIONAL THERAPY ORTHO TREATMENT REASSESSMENT AND DISCHARGE  Patient Name: Tammy Calderon MRN: [id number] DOB:Nov 10, 1966, 56 y.o., female Today's Date: 10/13/2022  PCP: Sallee Lange, MD  REFERRING PROVIDER: [name], MD    10/13/22 1514  OT Visits / Re-Eval  Visit Number 8  Number of Visits 13  Date for OT Re-Evaluation 10/03/22  Authorization  Authorization Type [employer]  Authorization Time Period 30 visits OT/PT/SLP combined, auth required after 30th  Progress Note Due on Visit 10  OT Time Calculation  OT Start Time 1350  OT Stop Time 1420  OT Time Calculation (min) 30 min  End of Session  Activity Tolerance Patient tolerated treatment well  Behavior During Therapy WFL for tasks assessed/performed     Past Medical History:  Diagnosis Date   Anemia    past   Asthma    uses albuterol approx 1-2x a week   Bell's palsy    resolved   Breast cancer ([place])    Fibromyalgia    GERD (gastroesophageal reflux disease)    Hypertension    PONV (postoperative nausea and vomiting)    Past Surgical History:  Procedure Laterality Date   ABDOMINAL HYSTERECTOMY     BREAST BIOPSY WITH RADIO FREQUENCY LOCALIZER Left 12/27/2021   Procedure: EXCISIONAL BREAST BIOPSY WITH RADIO FREQUENCY LOCALIZER X2;  Surgeon: Virl Cagey, MD;  Location: [facility];  Service: General;  Laterality: Left;   LYMPH NODE DISSECTION N/A 03/19/2022   Procedure: LYMPH NODE DISSECTION;  Surgeon: [name], MD;  Location: [facility];  Service: Gynecology;  Laterality: N/A;   NASAL SEPTUM SURGERY     ROBOTIC ASSISTED TOTAL HYSTERECTOMY WITH BILATERAL SALPINGO OOPHERECTOMY Bilateral 03/19/2022   Procedure: XI ROBOTIC ASSISTED TOTAL HYSTERECTOMY WITH BILATERAL SALPINGO OOPHORECTOMY;  Surgeon: [name], MD;  Location: [facility];  Service: Gynecology;  Laterality: Bilateral;   SENTINEL NODE BIOPSY N/A 03/19/2022   Procedure: SENTINEL NODE BIOPSY;  Surgeon: [name], MD;   Location: [facility];  Service: Gynecology;  Laterality: N/A;   SHOULDER ARTHROSCOPY WITH ROTATOR CUFF REPAIR AND OPEN BICEPS TENODESIS Right 07/15/2022   Procedure: RIGHT SHOULDER ARTHROSCOPY WITH ROTATOR CUFF REPAIR AND OPEN BICEPS TENODESIS;  Surgeon: [name], MD;  Location: [facility];  Service: Orthopedics;  Laterality: Right;   TUBAL LIGATION     WISDOM TOOTH EXTRACTION     Patient Active Problem List   Diagnosis Date Noted   Traumatic complete tear of right rotator cuff    Biceps tendinitis of right upper extremity    Endometrial cancer ([place]) 03/07/2022   PMB (postmenopausal bleeding) 03/07/2022   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   Flat epithelial atypia of breast, left    Atypical lobular hyperplasia of left breast 11/28/2021   History of UTI 11/07/2021   Gross hematuria 10/16/2021   Chronic back pain 12/19/2014   Edema 04/16/2014   Acute sinusitis 01/13/2014   Perimenopause 08/22/2013   Morbid obesity ([place]) 08/22/2013   Unspecified vitamin D deficiency 05/19/2013   Neuropathy 05/19/2013   Fibromyalgia 05/19/2013   Asthma with acute exacerbation 04/21/2013    ONSET DATE: 07/15/22  REFERRING DIAG: F02.774J (ICD-10-CM) - Traumatic complete tear of right rotator cuff, initial encounter  THERAPY DIAG:  Acute pain of right shoulder  Other symptoms and signs involving the musculoskeletal system  Rationale for Evaluation and Treatment Rehabilitation  SUBJECTIVE:   SUBJECTIVE STATEMENT: S: "I can tell a big difference."   PRECAUTIONS: Shoulder  WEIGHT  BEARING RESTRICTIONS Yes NWB  PAIN:  Are you having pain? No  OBJECTIVE:   HAND DOMINANCE: Left  FUNCTIONAL OUTCOME MEASURES: FOTO: 36.13 10/10 FOTO: 57.99  UPPER EXTREMITY ROM     *Measurements taken with pt in supine position. Adducted IR/er. Passive ROM Right eval Right 10/10  Shoulder flexion 92 160  Shoulder abduction 69 154  Shoulder internal rotation 90  90  Shoulder external rotation 65 84  (Blank rows = not tested)  Active ROM Right eval Right 10/10 Right 10/23  Shoulder flexion  168 168  Shoulder abduction  140 155  Shoulder internal rotation  90 90  Shoulder external rotation  88 89  (Blank rows = not tested)    UPPER EXTREMITY MMT:     MMT Right eval Right 10/10 Right 10/23  Shoulder flexion  4-/5 4+/5  Shoulder abduction  4-/5 4/5  Shoulder internal rotation  5/5 5/5  Shoulder external rotation  5/5 5/5  Elbow flexion  5/5   Elbow extension  4+/5   (Blank rows = not tested)   SENSATION: Pt reports some tingling/loss of sensation in her entire hand 10/10: all tingling and loss of sensation has stopped.  10/23: No tingling/numbness   COGNITION: Overall cognitive status: Within functional limits for tasks assessed   OBSERVATIONS: Fascial restrictions noted throughout UE, mostly noted at upper trapezius, biceps, and deltoids   TODAY'S TREATMENT:   10/13/22 -Theraband Strengthening: red band-protraction, flexion, horizontal abduction, er/IR, abduction, 10 reps each -Strengthening: 1#- protraction, flexion, horizontal abduction, er/IR, abduction, 5 reps each -IR stretch behind back with horizontal towel, 3x10" holds -Ring pass behind back-10 reps  09/30/22 -Manual Therapy: myofascial release, trigger point massage to the R shoulder girdle and scapular region, to address fascial restrictions, in order to reduce pain and improve ROM. -P/ROM: Supine, flexion, abduction, er/IR, 1x10 -A/ROM: Supine, flexion, abduction, horizontal abduction, protraction, er/IR, 1x10 -Shoulder strengthening: 1lb dumbbell, flexion, abduction, protraction, horizontal abduction, er/IR, 1x10 -Strengthening: Bicep curls, tricep extensions, 2lb dumbbell, 1x10 -Scapula Strengthing: rows, retraction, protraction, extension, red theraband, 1x10  09/25/22 -Manual Therapy: myofascial release, trigger point massage to the R shoulder girdle  and scapular region, to address fascial restrictions, in order to reduce pain and improve ROM. -A/ROM: Supine, flexion, abduction, horizontal abduction, protraction, er/IR, 1x10 -AA/ROM: Seated, flexion, abduction, horizontal abduction, protraction, er/IR, 3lb dowel rod, 1x10 -Shoulder strengthening: no weight, flexion, abduction, protraction, er/IR, 1x10 -Strengthening: Bicep curls, tricep extensions, 2lb dumbbell, 1x10 -UBE: Level 1, pace 2.0, 2 min forward, 2 min backward     PATIENT EDUCATION: Education details: Shoulder Strengthening-red theraband and hand weights Person educated: Patient Education method: Explanation, Demonstration, and Handouts Education comprehension: verbalized understanding and returned demonstration   HOME EXERCISE PROGRAM: Eval: AA/ROM 9/19: table slides, wall slides 9/26: A/ROM and Scap strengthening (red Theraband) 10/10: Shoulder strengthening 10/23:  Shoulder Strengthening-red theraband and hand weights  GOALS: Goals reviewed with patient? Yes  SHORT TERM GOALS: Target date:  09/19/22     Pt will be provided with and educated on HEP to improve mobility in RUE required for ADL completion.   Goal status: MET  2.  Pt will decrease pain in RUE to 2/10 or less in order to sleep for 4+ consecutive hours without waking due to pain.   Goal status: MET  3.  Pt will increase P/ROM in RUE to [facility] to improve ability to perform dressing tasks with minimal compensatory techniques.   Goal status: MET  4.  Pt will increase strength in RUE  to 3/5 to improve ability to perform light activity at waist height.  Goal status: MET    LONG TERM GOALS: Target date:  10/03/22     Pt will decrease pain in RUE to 0/10 or less in order to complete daily tasks such as meal preparation and showering without requiring rest breaks due to increased pain.   Goal status: MET  2.  Pt will decrease RUE fascial restrictions to minimal amounts or less to improve mobility  required for overhead reaching tasks.    Goal status: MET  3.  Pt will increase A/ROM of RUE to [facility] to improve ability to reach overhead and behind back during dressing and bathing tasks.   Goal status: MET  4.  Pt will increase strength in RUE to 4+/5 to improve ability to perform lifting tasks required for lifting pots and pans for meal preparation.   Goal status: PARTIALLY MET   ASSESSMENT:  CLINICAL IMPRESSION: Reassessment completed this session, pt has met all STGs and 3/4 LTGs demonstrating ROM and strength WFL and equivalent to LUE. Pt reports she is fixing her own hair, completing ADLs using the RUE, and lifting lightweight items. Pt reports she is having little to no pain and is able to sleep without waking due to her shoulder. Reviewed HEP and updated for red theraband strengthening, also provided IR stretch. Pt is agreeable to discharge today.   PLAN: OT FREQUENCY: 2x/week  OT DURATION: 6 weeks  PLANNED INTERVENTIONS: self care/ADL training, therapeutic exercise, therapeutic activity, manual therapy, scar mobilization, manual lymph drainage, passive range of motion, moist heat, cryotherapy, patient/family education, energy conservation, coping strategies training, and DME and/or AE instructions  CONSULTED AND AGREED WITH PLAN OF CARE: Patient  PLAN FOR NEXT SESSION: Discharge pt   Guadelupe Sabin, OTR/L  [phone] 10/13/2022, 3:18 PM    OCCUPATIONAL THERAPY DISCHARGE SUMMARY  Visits from Start of Care: 8  Current functional level related to goals / functional outcomes: See above. Pt has met all STGs nad 3/4 LTGs, is demonstrating ROM and strength WFL and equivalent to LUE.    Remaining deficits: Decreased activity tolerance   Education / Equipment: HEP for strengthening   Patient agrees to discharge. Patient goals were met. Patient is being discharged due to meeting the stated rehab goals.Marland Kitchen

## 2022-10-16 ENCOUNTER — Ambulatory Visit (HOSPITAL_BASED_OUTPATIENT_CLINIC_OR_DEPARTMENT_OTHER): Payer: 59 | Admitting: Orthopaedic Surgery

## 2022-10-17 ENCOUNTER — Encounter (HOSPITAL_COMMUNITY): Payer: 59 | Admitting: Occupational Therapy

## 2022-10-20 ENCOUNTER — Encounter (HOSPITAL_COMMUNITY): Payer: 59 | Admitting: Occupational Therapy

## 2022-10-22 ENCOUNTER — Encounter (HOSPITAL_COMMUNITY): Payer: 59 | Admitting: Occupational Therapy

## 2022-10-28 ENCOUNTER — Telehealth: Payer: Self-pay | Admitting: Family Medicine

## 2022-10-28 ENCOUNTER — Other Ambulatory Visit: Payer: Self-pay

## 2022-10-28 DIAGNOSIS — G8929 Other chronic pain: Secondary | ICD-10-CM

## 2022-10-28 NOTE — Telephone Encounter (Signed)
Referral has been placed. 

## 2022-10-28 NOTE — Telephone Encounter (Signed)
Patient complains of back pain She has had this ongoing since her last visit Would like to have physical therapy through [facility] outpatient on [address] Please go ahead with physical therapy referral evaluate and treat thank you patient is aware that we are initiating this

## 2022-11-19 ENCOUNTER — Ambulatory Visit (HOSPITAL_BASED_OUTPATIENT_CLINIC_OR_DEPARTMENT_OTHER): Payer: 59 | Admitting: Orthopaedic Surgery

## 2022-12-03 ENCOUNTER — Ambulatory Visit (HOSPITAL_BASED_OUTPATIENT_CLINIC_OR_DEPARTMENT_OTHER): Payer: 59 | Admitting: Orthopaedic Surgery

## 2022-12-04 ENCOUNTER — Ambulatory Visit: Payer: Medicaid Other | Admitting: Family Medicine

## 2022-12-04 VITALS — BP 132/60 | Ht 61.0 in | Wt 241.8 lb

## 2022-12-04 DIAGNOSIS — E785 Hyperlipidemia, unspecified: Secondary | ICD-10-CM | POA: Diagnosis not present

## 2022-12-04 DIAGNOSIS — M797 Fibromyalgia: Secondary | ICD-10-CM | POA: Diagnosis not present

## 2022-12-04 DIAGNOSIS — Z1211 Encounter for screening for malignant neoplasm of colon: Secondary | ICD-10-CM | POA: Diagnosis not present

## 2022-12-04 DIAGNOSIS — M2550 Pain in unspecified joint: Secondary | ICD-10-CM

## 2022-12-04 MED ORDER — DULOXETINE HCL 20 MG PO CPEP: 20.0000 mg | ORAL_CAPSULE | Freq: Every day | ORAL | 3 refills | Status: DC

## 2022-12-04 MED ORDER — LISINOPRIL-HYDROCHLOROTHIAZIDE 20-12.5 MG PO TABS: ORAL_TABLET | ORAL | 5 refills | Status: DC

## 2022-12-04 NOTE — Progress Notes (Signed)
   Subjective:    Patient ID: Tammy Calderon, female    DOB: March 09, 1966, 56 y.o.   MRN: [id number]  HPI Patient arrives for a follow up on blood pressure. Patient states she is also having a lot of muscular pain Patient has fibromyalgia.  She suffers with a lot of body aches pains and discomforts She also relates a lot of joint pain and discomfort bilateral in the shoulders elbows hips knees.  In addition to this relates a lot of fatigue tiredness.   Review of Systems     Objective:   Physical Exam  General-in no acute distress Eyes-no discharge Lungs-respiratory rate normal, CTA CV-no murmurs,RRR Extremities skin warm dry no edema Neuro grossly normal Behavior normal, alert       Assessment & Plan:  1. Fibromyalgia We did discuss how we could try Cymbalta to see if this would help.  She is willing to try the low-dose.  We did discuss how apparently back in 2015 and did cause some fatigue but she also relates that she would be willing to try this again.  She will let us know if it causes significant fatigue - Sedimentation Rate - C-reactive protein - Rheumatoid factor - Lipid panel  2. Arthralgia, unspecified joint Significant arthralgias and discomfort worse in the shoulders hips knees back lab work ordered - Sedimentation Rate - C-reactive protein - Rheumatoid factor  3. Hyperlipidemia, unspecified hyperlipidemia type Lipid profile has history of hyperlipidemia healthy diet recommended - Sedimentation Rate - C-reactive protein - Rheumatoid factor - Lipid panel  4. Screening for colon cancer Screening patient chooses Cologuard no family history of colon cancer - Cologuard

## 2022-12-11 ENCOUNTER — Other Ambulatory Visit (HOSPITAL_COMMUNITY)
Admission: RE | Admit: 2022-12-11 | Discharge: 2022-12-11 | Disposition: A | Discharge status: Home / Self Care | Payer: Medicaid Other | Source: Ambulatory Visit | Attending: Family Medicine | Admitting: Family Medicine

## 2022-12-11 DIAGNOSIS — M797 Fibromyalgia: Secondary | ICD-10-CM | POA: Insufficient documentation

## 2022-12-11 LAB — LIPID PANEL
Cholesterol: 311 mg/dL — ABNORMAL HIGH (ref 0–200)
HDL: 59 mg/dL (ref >40)
LDL Cholesterol: 227 mg/dL — ABNORMAL HIGH (ref 0–99)
Total CHOL/HDL Ratio: 5.3 RATIO
Triglycerides: 124 mg/dL (ref <150)
VLDL: 25 mg/dL (ref 0–40)

## 2022-12-11 LAB — SEDIMENTATION RATE: Sed Rate: 40 mm/hr — ABNORMAL HIGH (ref 0–22)

## 2022-12-11 LAB — C-REACTIVE PROTEIN: CRP: 0.6 mg/dL (ref <1.0)

## 2022-12-13 LAB — RHEUMATOID FACTOR: Rheumatoid fact SerPl-aCnc: 10.7 IU/mL (ref <14.0)

## 2023-01-01 ENCOUNTER — Ambulatory Visit: Payer: Medicaid Other | Admitting: Family Medicine

## 2023-01-01 ENCOUNTER — Telehealth: Payer: Self-pay | Admitting: Family Medicine

## 2023-01-01 ENCOUNTER — Encounter: Payer: Self-pay | Admitting: Family Medicine

## 2023-01-01 VITALS — BP 136/74 | Wt 243.6 lb

## 2023-01-01 DIAGNOSIS — M5432 Sciatica, left side: Secondary | ICD-10-CM | POA: Diagnosis not present

## 2023-01-01 DIAGNOSIS — M797 Fibromyalgia: Secondary | ICD-10-CM | POA: Diagnosis not present

## 2023-01-01 DIAGNOSIS — M2550 Pain in unspecified joint: Secondary | ICD-10-CM | POA: Diagnosis not present

## 2023-01-01 DIAGNOSIS — F321 Major depressive disorder, single episode, moderate: Secondary | ICD-10-CM

## 2023-01-01 MED ORDER — DULOXETINE HCL 60 MG PO CPEP: 60.0000 mg | ORAL_CAPSULE | Freq: Every day | ORAL | 3 refills | Status: DC

## 2023-01-01 NOTE — Telephone Encounter (Signed)
Pt in for office visit today and states she is needing letter for disability. Also needing a note for social services as to why pt is not working (pt is on [employer]ic[employer], medical). Pt would like note to social service faxed-pt is in [place]. Please advise. Thank you

## 2023-01-01 NOTE — Progress Notes (Signed)
Subjective:    Patient ID: Tammy Calderon, female    DOB: 05-02-1966, 57 y.o.   MRN: [id number]  HPI Pt arrives for 4 week follow up. Pt is trying to get on disability. Pt states she is not really seeing a difference with Cymbalta. She is tolerating the Cymbalta at the low-dose She has a lot of body aches discomfort with soreness in the arms and legs She also has a lot of stiffness in her joints arms back hips. She relates a lot of soreness in her muscles.  A lot of stiffness pain and discomfort in her hips and her knees.  She also relates a lot of fatigue tiredness inability to squat.  Inability to get on her knees.  Cannot do repetitive motion Relates everything she does it takes her a lot longer to do and she is much slower. She relates she cannot sit for more than 20 or 30 minutes with either having to stand up for him to lay down to get some relief.  She also states if she stands more than 10 minutes she has a lot of back and leg pain and has to sit down.  She is modified what she does around the house does not do any heavy lifting lifts no more than a few pounds at a time and typically uses both hands to do so. Patient has tried Tylenol, NSAIDs, currently Cymbalta, the symptoms been going on for years and progressively worse Patient did at 1 time work at [employer] but she is no longer able to do that type of work Given her current status she is applied for disability.  Unable to do much activity at all Pain-  Pt would like to discuss lab results.  Elevated lipid and sed rate  Review of Systems     Objective:   Physical Exam General-anxious Eyes-no discharge Lungs-respiratory rate normal, CTA CV-no murmurs,RRR Extremities skin warm dry no edema Neuro grossly normal Behavior normal, alert        Assessment & Plan:  1. Fibromyalgia Patient with significant fibromyalgia Increase Cymbalta Her sed rate was mildly elevated.  Will check ANA along with sed rate patient will  do this in the near future she will also do a consultation with rheumatology Patient understands that if rheumatology does not find autoimmune illness they will return to her test for further management of her fibromyalgia Patient with significant physical limitations.  He can only lift a few pounds at a time Unable to sit for any significant length of time or stand for any significant length of time would require frequent position changes also because of the pain and discomfort she is currently disabled. - Sedimentation Rate - ANA - Ambulatory referral to Rheumatology  2. Arthralgia, unspecified joint Arthralgias patient was encouraged to try to do what she can tolerate currently she is disabled she is applying for disability See discussion above Referral to rheumatology for further evaluation If rheumatology does not find autoimmune illness they will refer her back to [place] for further management of her fibromyalgia - Sedimentation Rate - ANA - Ambulatory referral to Rheumatology  3. Morbid obesity ([place]) Portion control regular physical activity try to bring weight down  4. Depression, major, single episode, moderate (H[facility]) Counseling would help increase Cymbalta to 60 mg if she cannot tolerate this she will let us know - Ambulatory referral to Psychiatry  5. Sciatica, left side Intermittent sciatica hopefully Cymbalta will help this if he gets worse we will need  to do further workup and possible referral to a spine specialist Patient may do back stretches to try to help.

## 2023-01-02 ENCOUNTER — Ambulatory Visit (HOSPITAL_COMMUNITY): Payer: Medicaid Other | Attending: Orthopaedic Surgery

## 2023-01-02 ENCOUNTER — Other Ambulatory Visit: Payer: Self-pay

## 2023-01-02 DIAGNOSIS — M5440 Lumbago with sciatica, unspecified side: Secondary | ICD-10-CM | POA: Diagnosis not present

## 2023-01-02 DIAGNOSIS — M5442 Lumbago with sciatica, left side: Secondary | ICD-10-CM | POA: Diagnosis not present

## 2023-01-02 DIAGNOSIS — G8929 Other chronic pain: Secondary | ICD-10-CM | POA: Insufficient documentation

## 2023-01-02 DIAGNOSIS — M545 Low back pain, unspecified: Secondary | ICD-10-CM | POA: Diagnosis not present

## 2023-01-02 NOTE — Therapy (Signed)
OUTPATIENT PHYSICAL THERAPY THORACOLUMBAR EVALUATION   Patient Name: Tammy Calderon MRN: [id number] DOB:[birth year], 57 y.o., female Today's Date: 01/02/2023  END OF SESSION:  PT End of Session - 01/02/23 1302     Visit Number 1    Number of Visits 8    Date for PT Re-Evaluation 01/30/23    Authorization Time Period Medicaid [employer]    Authorization - Visit Number 0    PT Start Time 1300    PT Stop Time 1340    PT Time Calculation (min) 40 min    Activity Tolerance Patient tolerated treatment well    Behavior During Therapy WFL for tasks assessed/performed             Past Medical History:  Diagnosis Date   Anemia    past   Asthma    uses albuterol approx 1-2x a week   Bell's palsy    resolved   Breast cancer (HCC)    Fibromyalgia    GERD (gastroesophageal reflux disease)    Hypertension    PONV (postoperative nausea and vomiting)    Past Surgical History:  Procedure Laterality Date   ABDOMINAL HYSTERECTOMY     BREAST BIOPSY WITH RADIO FREQUENCY LOCALIZER Left 12/27/2021   Procedure: EXCISIONAL BREAST BIOPSY WITH RADIO FREQUENCY LOCALIZER X2;  Surgeon: Virl Cagey, MD;  Location: [facility];  Service: General;  Laterality: Left;   LYMPH NODE DISSECTION N/A 03/19/2022   Procedure: LYMPH NODE DISSECTION;  Surgeon: [name], MD;  Location: [facility];  Service: Gynecology;  Laterality: N/A;   NASAL SEPTUM SURGERY     ROBOTIC ASSISTED TOTAL HYSTERECTOMY WITH BILATERAL SALPINGO OOPHERECTOMY Bilateral 03/19/2022   Procedure: XI ROBOTIC ASSISTED TOTAL HYSTERECTOMY WITH BILATERAL SALPINGO OOPHORECTOMY;  Surgeon: [name], MD;  Location: [facility];  Service: Gynecology;  Laterality: Bilateral;   SENTINEL NODE BIOPSY N/A 03/19/2022   Procedure: SENTINEL NODE BIOPSY;  Surgeon: [name], MD;  Location: [facility];  Service: Gynecology;  Laterality: N/A;   SHOULDER ARTHROSCOPY WITH ROTATOR CUFF REPAIR AND OPEN BICEPS TENODESIS Right 07/15/2022    Procedure: RIGHT SHOULDER ARTHROSCOPY WITH ROTATOR CUFF REPAIR AND OPEN BICEPS TENODESIS;  Surgeon: [name], MD;  Location: [facility];  Service: Orthopedics;  Laterality: Right;   TUBAL LIGATION     WISDOM TOOTH EXTRACTION     Patient Active Problem List   Diagnosis Date Noted   Traumatic complete tear of right rotator cuff    Biceps tendinitis of right upper extremity    Endometrial cancer ([place]) 03/07/2022   PMB (postmenopausal bleeding) 03/07/2022   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   Flat epithelial atypia of breast, left    Atypical lobular hyperplasia of left breast 11/28/2021   History of UTI 11/07/2021   Gross hematuria 10/16/2021   Chronic back pain 12/19/2014   Edema 04/16/2014   Acute sinusitis 01/13/2014   Perimenopause 08/22/2013   Morbid obesity ([place]) 08/22/2013   Unspecified vitamin D deficiency 05/19/2013   Neuropathy 05/19/2013   Fibromyalgia 05/19/2013   Asthma with acute exacerbation 04/21/2013    PCP: Sallee Lange, MD  REFERRING PROVIDER: Sallee Lange, MD  REFERRING DIAG:  Diagnosis  M54.40,G89.29 (ICD-10-CM) - Chronic low back pain with sciatica, sciatica laterality unspecified, unspecified back pain laterality    Rationale for Evaluation and Treatment: Rehabilitation  THERAPY DIAG:  Low back pain, unspecified back pain laterality, unspecified chronicity, unspecified whether sciatica present  Chronic bilateral low back pain  with left-sided sciatica  ONSET DATE: about a year; after a fall  SUBJECTIVE:                                                                                                                                                                                           SUBJECTIVE STATEMENT: Diagnosis  M54.40,G89.29 (ICD-10-CM) - Chronic low back pain with sciatica, sciatica laterality unspecified, unspecified back pain laterality  Pain in low back; off and on down into her left leg; she  fell about a year ago; slipped and fell off her porch; that seems to exacerbate her back pain and leg pain.    PERTINENT HISTORY:  Here recently for OT for s/p RTC per Dr. Sammuel Hines; surgery in June Cancer breast  fibromyalgia  PAIN:  Are you having pain? Yes: NPRS scale: 5/10 Pain location: low back Pain description: aches Aggravating factors: walk a lot, prolonged standing Relieving factors: sitting, rest  PRECAUTIONS: None  WEIGHT BEARING RESTRICTIONS: No  FALLS:  Has patient fallen in last 6 months? No  LIVING ENVIRONMENT: Lives with: lives with their spouse Lives in: House/apartment Stairs: Yes: External: 5 steps; on right going up, on left going up, and can reach both Has following equipment at home: Single point cane and Crutches  OCCUPATION: does not work  PLOF: Independent  PATIENT GOALS: less back pain  NEXT MD VISIT: saw him yesterday; has an appt to see him next month  OBJECTIVE:   DIAGNOSTIC FINDINGS:  Right fibula fracture back in April  PATIENT SURVEYS:  Modified Oswestry 32/50, 64%    COGNITION: Overall cognitive status: Within functional limits for tasks assessed     SENSATION: Back numbness if she stands or walks for a long time  POSTURE: rounded shoulders, forward head, and increased lumbar lordosis  PALPATION: Tender paraspinals bilateral thoracic and lumbar spine  LUMBAR ROM:   AROM eval  Flexion 70% available *  Extension 30% available*  Right lateral flexion   Left lateral flexion   Right rotation   Left rotation    (Blank rows = not tested)   LOWER EXTREMITY MMT:    MMT Right eval Left eval  Hip flexion 4- 3+  Hip extension  2+ 2+  Hip abduction    Hip adduction    Hip internal rotation    Hip external rotation    Knee flexion 3- 3-  Knee extension 3+ 3+  Ankle dorsiflexion 4- 4-  Ankle plantarflexion    Ankle inversion    Ankle eversion     (Blank rows = not tested)  FUNCTIONAL TESTS:  5 times sit to stand:  55 sec  GAIT: Distance walked: 50 ft Assistive device utilized: None Level of assistance: Modified independence Comments: slower than normal gait speed  TODAY'S TREATMENT:                                                                                                                              DATE: Physical therapy evaluation and HEP instruction    PATIENT EDUCATION:  Education details: Patient educated on exam findings, POC, scope of PT, HEP, and good sitting posture. Person educated: Patient Education method: Explanation, Demonstration, and Handouts Education comprehension: verbalized understanding, returned demonstration, verbal cues required, and tactile cues required   HOME EXERCISE PROGRAM: Access Code: [email or web address] URL: [email or web address] Date: 01/02/2023 Prepared by: [facility]  Exercises - Seated Scapular Retraction  - 2 x daily - 7 x weekly - 1 sets - 10 reps - 5 sec hold - Supine Lower Trunk Rotation  - 2 x daily - 7 x weekly - 1 sets - 10 reps - Supine Transversus Abdominis Bracing - Hands on Stomach  - 2 x daily - 7 x weekly - 1 sets - 10 reps - 5 sec hold  ASSESSMENT:  CLINICAL IMPRESSION:  Patient is a 57 y.o. female who presents to physical therapy with complaint of chronic low back pain that . Patient demonstrates muscle weakness, reduced ROM, and fascial restrictions which are likely contributing to symptoms of pain and are negatively impacting patient ability to perform ADLs and functional mobility tasks. Patient will benefit from skilled physical therapy services to address these deficits to reduce pain and improve level of function with ADLs and functional mobility tasks.   OBJECTIVE IMPAIRMENTS: Abnormal gait, decreased activity tolerance, decreased endurance, decreased mobility, difficulty walking, decreased ROM, decreased strength, hypomobility, increased fascial restrictions, impaired perceived functional ability, impaired flexibility,  impaired sensation, and pain.   ACTIVITY LIMITATIONS: carrying, lifting, bending, sitting, standing, squatting, sleeping, stairs, transfers, bed mobility, bathing, dressing, reach over head, hygiene/grooming, locomotion level, and caring for others  PARTICIPATION LIMITATIONS: meal prep, cleaning, laundry, medication management, shopping, and community activity  PERSONAL FACTORS: 1 comorbidity: fibromyalgia  are also affecting patient's functional outcome.   REHAB POTENTIAL: Good  CLINICAL DECISION MAKING: Stable/uncomplicated  EVALUATION COMPLEXITY: Low   GOALS: Goals reviewed with patient? No  SHORT TERM GOALS: Target date: 01/16/2023  patient will be independent with initial HEP  Baseline: Goal status: INITIAL  2.  Patient will self report 30% improvement to improve tolerance for functional activity   Baseline:  Goal status: INITIAL   LONG TERM GOALS: Target date: 01/30/2023  Patient will be independent in self management strategies to improve quality of life and functional outcomes. Baseline:  Goal status: INITIAL  2.  Patient will self report 50% improvement to improve tolerance for functional activity   Baseline:  Goal status: INITIAL  3.  Patient will improve Modified Oswestry  score by 4 points to 28 to demonstrate improved tolerance for functional activity   Baseline: 28  Goal status: INITIAL  4.  Patient will improve 5 times sit to stand score from 55 sec to 30 sec or less to demonstrate improved functional mobility and increased lower extremity strength.  Baseline: 55 sec Goal status: INITIAL  5.  Patient will increase  leg MMTs to 4+ to 5/5 without pain to promote return to ambulation community distances with minimal deviation.  Baseline:  Goal status: INITIAL   PLAN:  PT FREQUENCY: 2x/week  PT DURATION: 4 weeks  PLANNED INTERVENTIONS: Therapeutic exercises, Therapeutic activity, Neuromuscular re-education, Balance training, Gait training,  Patient/Family education, Joint manipulation, Joint mobilization, Stair training, Orthotic/Fit training, DME instructions, Aquatic Therapy, Dry Needling, Electrical stimulation, Spinal manipulation, Spinal mobilization, Cryotherapy, Moist heat, Compression bandaging, scar mobilization, Splintting, Taping, Traction, Ultrasound, Ionotophoresis '4mg'$ /ml Dexamethasone, and Manual therapy .  PLAN FOR NEXT SESSION: Review HEP and goals, progress lumbar mobility and core and Lower extremity strength as able   2:16 PM, 01/02/23 Saide Lanuza Small Richerd Grime MPT [facility] [phone] SV:[phone]

## 2023-01-03 ENCOUNTER — Encounter: Payer: Self-pay | Admitting: Family Medicine

## 2023-01-03 NOTE — Telephone Encounter (Signed)
Letter was dictated thank you

## 2023-01-03 NOTE — Progress Notes (Signed)
Patient is requesting that her letter be faxed to social services please see telephone message May also mail a copy to the patient thank you

## 2023-01-05 ENCOUNTER — Encounter (HOSPITAL_COMMUNITY): Payer: Medicaid Other | Admitting: Physical Therapy

## 2023-01-12 ENCOUNTER — Ambulatory Visit (HOSPITAL_COMMUNITY): Payer: Medicaid Other | Admitting: Physical Therapy

## 2023-01-12 ENCOUNTER — Encounter (HOSPITAL_COMMUNITY): Payer: Self-pay

## 2023-01-15 ENCOUNTER — Encounter (HOSPITAL_COMMUNITY): Payer: Medicaid Other | Admitting: Physical Therapy

## 2023-01-21 ENCOUNTER — Ambulatory Visit (HOSPITAL_COMMUNITY): Payer: Medicaid Other

## 2023-01-21 DIAGNOSIS — M5442 Lumbago with sciatica, left side: Secondary | ICD-10-CM

## 2023-01-21 DIAGNOSIS — M5440 Lumbago with sciatica, unspecified side: Secondary | ICD-10-CM | POA: Diagnosis not present

## 2023-01-21 DIAGNOSIS — G8929 Other chronic pain: Secondary | ICD-10-CM

## 2023-01-21 DIAGNOSIS — M545 Low back pain, unspecified: Secondary | ICD-10-CM | POA: Diagnosis not present

## 2023-01-21 NOTE — Therapy (Signed)
OUTPATIENT PHYSICAL THERAPY THORACOLUMBAR TREATMENT  Patient Name: Tammy Calderon MRN: [id number] DOB:1966-09-23, 57 y.o., female Today's Date: 01/21/2023  END OF SESSION:  PT End of Session - 01/21/23 1422     Visit Number 2    Number of Visits 8    Date for PT Re-Evaluation 01/30/23    Authorization Type Medicaid [employer]    Authorization Time Period 8 visits from 01/02/23 to 03/02/23    Authorization - Visit Number 1    Authorization - Number of Visits 8    Progress Note Due on Visit 8    PT Start Time 0223    PT Stop Time 0305    PT Time Calculation (min) 42 min    Activity Tolerance Patient tolerated treatment well    Behavior During Therapy WFL for tasks assessed/performed             Past Medical History:  Diagnosis Date   Anemia    past   Asthma    uses albuterol approx 1-2x a week   Bell's palsy    resolved   Breast cancer (HCC)    Fibromyalgia    GERD (gastroesophageal reflux disease)    Hypertension    PONV (postoperative nausea and vomiting)    Past Surgical History:  Procedure Laterality Date   ABDOMINAL HYSTERECTOMY     BREAST BIOPSY WITH RADIO FREQUENCY LOCALIZER Left 12/27/2021   Procedure: EXCISIONAL BREAST BIOPSY WITH RADIO FREQUENCY LOCALIZER X2;  Surgeon: Virl Cagey, MD;  Location: [facility];  Service: General;  Laterality: Left;   LYMPH NODE DISSECTION N/A 03/19/2022   Procedure: LYMPH NODE DISSECTION;  Surgeon: [name], MD;  [facility];  Service: Gynecology;  Laterality: N/A;   NASAL SEPTUM SURGERY     ROBOTIC ASSISTED TOTAL HYSTERECTOMY WITH BILATERAL SALPINGO OOPHERECTOMY Bilateral 03/19/2022   Procedure: XI ROBOTIC ASSISTED TOTAL HYSTERECTOMY WITH BILATERAL SALPINGO OOPHORECTOMY;  Surgeon: [name], MD;  Location: [facility];  Service: Gynecology;  Laterality: Bilateral;   SENTINEL NODE BIOPSY N/A 03/19/2022   Procedure: SENTINEL NODE BIOPSY;  Surgeon: [name], MD;  Location[facility];  Service:  Gynecology;  Laterality: N/A;   SHOULDER ARTHROSCOPY WITH ROTATOR CUFF REPAIR AND OPEN BICEPS TENODESIS Right 07/15/2022   Procedure: RIGHT SHOULDER ARTHROSCOPY WITH ROTATOR CUFF REPAIR AND OPEN BICEPS TENODESIS;  Surgeon: [name], MD;  Location: [facility];  Service: Orthopedics;  Laterality: Right;   TUBAL LIGATION     WISDOM TOOTH EXTRACTION     Patient Active Problem List   Diagnosis Date Noted   Traumatic complete tear of right rotator cuff    Biceps tendinitis of right upper extremity    Endometrial cancer ([place]) 03/07/2022   PMB (postmenopausal bleeding) 03/07/2022   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   Flat epithelial atypia of breast, left    Atypical lobular hyperplasia of left breast 11/28/2021   History of UTI 11/07/2021   Gross hematuria 10/16/2021   Chronic back pain 12/19/2014   Edema 04/16/2014   Acute sinusitis 01/13/2014   Perimenopause 08/22/2013   Morbid obesity ([place]) 08/22/2013   Unspecified vitamin D deficiency 05/19/2013   Neuropathy 05/19/2013   Fibromyalgia 05/19/2013   Asthma with acute exacerbation 04/21/2013    PCP: Sallee Lange, MD  REFERRING PROVIDER: Sallee Lange, MD  REFERRING DIAG:  Diagnosis  M54.40,G89.29 (ICD-10-CM) - Chronic low back pain with sciatica, sciatica laterality unspecified, unspecified back pain laterality    Rationale  for Evaluation and Treatment: Rehabilitation  THERAPY DIAG:  Low back pain, unspecified back pain laterality, unspecified chronicity, unspecified whether sciatica present  Chronic bilateral low back pain with left-sided sciatica  ONSET DATE: about a year; after a fall  SUBJECTIVE:                                                                                                                                                                                           SUBJECTIVE STATEMENT: Missed a couple visits due to feeling bad with cold weather and not wanting to  drive in possible bad weather.  Patient reports some days are worse than others.  She reports some increased pain with walking a lot at [facility] visiting a friend earlier today; aching.  7/10 back pain today.  Reports no issues with HEP.      EVAL: Diagnosis  M54.40,G89.29 (ICD-10-CM) - Chronic low back pain with sciatica, sciatica laterality unspecified, unspecified back pain laterality  Pain in low back; off and on down into her left leg; she fell about a year ago; slipped and fell off her porch; that seems to exacerbate her back pain and leg pain.    PERTINENT HISTORY:  Here recently for OT for s/p RTC per Dr. Sammuel Hines; surgery in June Cancer breast  fibromyalgia  PAIN:  Are you having pain? Yes: NPRS scale: 7/10 Pain location: low back Pain description: aches Aggravating factors: walk a lot, prolonged standing Relieving factors: sitting, rest  PRECAUTIONS: None  WEIGHT BEARING RESTRICTIONS: No  FALLS:  Has patient fallen in last 6 months? No  LIVING ENVIRONMENT: Lives with: lives with their spouse Lives in: House/apartment Stairs: Yes: External: 5 steps; on right going up, on left going up, and can reach both Has following equipment at home: Single point cane and Crutches  OCCUPATION: does not work  PLOF: Independent  PATIENT GOALS: less back pain  NEXT MD VISIT: saw him yesterday; has an appt to see him next month  OBJECTIVE:   DIAGNOSTIC FINDINGS:  Right fibula fracture back in April  PATIENT SURVEYS:  Modified Oswestry 32/50, 64%    COGNITION: Overall cognitive status: Within functional limits for tasks assessed     SENSATION: Back numbness if she stands or walks for a long time  POSTURE: rounded shoulders, forward head, and increased lumbar lordosis  PALPATION: Tender paraspinals bilateral thoracic and lumbar spine  LUMBAR ROM:   AROM eval  Flexion 70% available *  Extension 30% available*  Right lateral flexion   Left lateral flexion    Right rotation   Left rotation    (Blank rows = not tested)  LOWER EXTREMITY MMT:    MMT Right eval Left eval  Hip flexion 4- 3+  Hip extension  2+ 2+  Hip abduction    Hip adduction    Hip internal rotation    Hip external rotation    Knee flexion 3- 3-  Knee extension 3+ 3+  Ankle dorsiflexion 4- 4-  Ankle plantarflexion    Ankle inversion    Ankle eversion     (Blank rows = not tested)  FUNCTIONAL TESTS:  5 times sit to stand: 55 sec  GAIT: Distance walked: 50 ft Assistive device utilized: None Level of assistance: Modified independence Comments: slower than normal gait speed  TODAY'S TREATMENT:                                                                                                                            DATE: 01/21/23 Review of HEP and goals Seated: Scapular retraction 5" hold x 10  Supine: LTR 2" hold x 10 Supine Transversus Abdominis Bracing 5" hold x 10 Decompression exercises 5" hold x 10 head press, shoulder press, leg press and leg lengthener Bridge x 10      Physical therapy evaluation and HEP instruction    PATIENT EDUCATION:  Education details: updated HEP Person educated: Patient Education method: Consulting civil engineer, Demonstration, and Handouts Education comprehension: verbalized understanding, returned demonstration, verbal cues required, and tactile cues required   HOME EXERCISE PROGRAM: 01/21/23 decompression exercises; bridge  Access Code: [email or web address] URL: [email or web address] Date: 01/02/2023 Prepared by: [facility]  Exercises - Seated Scapular Retraction  - 2 x daily - 7 x weekly - 1 sets - 10 reps - 5 sec hold - Supine Lower Trunk Rotation  - 2 x daily - 7 x weekly - 1 sets - 10 reps - Supine Transversus Abdominis Bracing - Hands on Stomach  - 2 x daily - 7 x weekly - 1 sets - 10 reps - 5 sec hold  ASSESSMENT:  CLINICAL IMPRESSION: Today's session started with a review of HEP and goals.  Patient  verbalizes agreement with set rehab goals. Added decompression exercises today and bridge and issued handout to add to HEP.  Patient needs cues for proper breathing and technique. Needs cues to brace abdominals before bridging.  Patient reports decreased pain to 5/10 after treatment today.   Patient will benefit from continued skilled therapy services to address deficits and promote return to optimal function.        Eval: Patient is a 57 y.o. female who presents to physical therapy with complaint of chronic low back pain that . Patient demonstrates muscle weakness, reduced ROM, and fascial restrictions which are likely contributing to symptoms of pain and are negatively impacting patient ability to perform ADLs and functional mobility tasks. Patient will benefit from skilled physical therapy services to address these deficits to reduce pain and improve level of function with ADLs and functional mobility tasks.   OBJECTIVE IMPAIRMENTS: Abnormal gait, decreased activity tolerance, decreased  endurance, decreased mobility, difficulty walking, decreased ROM, decreased strength, hypomobility, increased fascial restrictions, impaired perceived functional ability, impaired flexibility, impaired sensation, and pain.   ACTIVITY LIMITATIONS: carrying, lifting, bending, sitting, standing, squatting, sleeping, stairs, transfers, bed mobility, bathing, dressing, reach over head, hygiene/grooming, locomotion level, and caring for others  PARTICIPATION LIMITATIONS: meal prep, cleaning, laundry, medication management, shopping, and community activity  PERSONAL FACTORS: 1 comorbidity: fibromyalgia  are also affecting patient's functional outcome.   REHAB POTENTIAL: Good  CLINICAL DECISION MAKING: Stable/uncomplicated  EVALUATION COMPLEXITY: Low   GOALS: Goals reviewed with patient? No  SHORT TERM GOALS: Target date: 01/16/2023  patient will be independent with initial HEP  Baseline: Goal status: IN  PROGRESS  2.  Patient will self report 30% improvement to improve tolerance for functional activity   Baseline:  Goal status: IN PROGRESS   LONG TERM GOALS: Target date: 01/30/2023  Patient will be independent in self management strategies to improve quality of life and functional outcomes. Baseline:  Goal status: IN PROGRESS  2.  Patient will self report 50% improvement to improve tolerance for functional activity   Baseline:  Goal status: IN PROGRESS  3.  Patient will improve Modified Oswestry  score by 4 points to 28 to demonstrate improved tolerance for functional activity   Baseline: 28 Goal status: IN PROGRESS  4.  Patient will improve 5 times sit to stand score from 55 sec to 30 sec or less to demonstrate improved functional mobility and increased lower extremity strength.  Baseline: 55 sec Goal status: IN PROGRESS  5.  Patient will increase  leg MMTs to 4+ to 5/5 without pain to promote return to ambulation community distances with minimal deviation.  Baseline:  Goal status: IN PROGRESS   PLAN:  PT FREQUENCY: 2x/week  PT DURATION: 4 weeks  PLANNED INTERVENTIONS: Therapeutic exercises, Therapeutic activity, Neuromuscular re-education, Balance training, Gait training, Patient/Family education, Joint manipulation, Joint mobilization, Stair training, Orthotic/Fit training, DME instructions, Aquatic Therapy, Dry Needling, Electrical stimulation, Spinal manipulation, Spinal mobilization, Cryotherapy, Moist heat, Compression bandaging, scar mobilization, Splintting, Taping, Traction, Ultrasound, Ionotophoresis '4mg'$ /ml Dexamethasone, and Manual therapy .  PLAN FOR NEXT SESSION: progress lumbar mobility and core and Lower extremity strength as able   3:11 PM, 01/21/23 Alvon Nygaard Small Wilman Tucker MPT  physical therapy [facility] [phone]

## 2023-01-26 ENCOUNTER — Ambulatory Visit (HOSPITAL_COMMUNITY): Payer: Medicaid Other | Attending: Orthopaedic Surgery | Admitting: Physical Therapy

## 2023-01-26 DIAGNOSIS — M545 Low back pain, unspecified: Secondary | ICD-10-CM | POA: Insufficient documentation

## 2023-01-26 DIAGNOSIS — M5442 Lumbago with sciatica, left side: Secondary | ICD-10-CM | POA: Insufficient documentation

## 2023-01-26 DIAGNOSIS — G8929 Other chronic pain: Secondary | ICD-10-CM | POA: Insufficient documentation

## 2023-01-26 NOTE — Therapy (Signed)
OUTPATIENT PHYSICAL THERAPY THORACOLUMBAR TREATMENT  Patient Name: Tammy Calderon MRN: [id number] DOB:1966-10-06, 57 y.o., female Today's Date: 01/26/2023  END OF SESSION:  PT End of Session - 01/26/23 1352     Visit Number 3    Number of Visits 8    Date for PT Re-Evaluation 01/30/23    Authorization Type Medicaid [employer]    Authorization Time Period 8 visits from 01/02/23 to 03/02/23    Authorization - Visit Number 2    Authorization - Number of Visits 8    Progress Note Due on Visit 8    PT Start Time 1350    PT Stop Time 1428    PT Time Calculation (min) 38 min    Activity Tolerance Patient tolerated treatment well    Behavior During Therapy WFL for tasks assessed/performed             Past Medical History:  Diagnosis Date   Anemia    past   Asthma    uses albuterol approx 1-2x a week   Bell's palsy    resolved   Breast cancer ([place])    Fibromyalgia    GERD (gastroesophageal reflux disease)    Hypertension    PONV (postoperative nausea and vomiting)    Past Surgical History:  Procedure Laterality Date   ABDOMINAL HYSTERECTOMY     BREAST BIOPSY WITH RADIO FREQUENCY LOCALIZER Left 12/27/2021   Procedure: EXCISIONAL BREAST BIOPSY WITH RADIO FREQUENCY LOCALIZER X2;  Surgeon: Virl Cagey, MD;  Location: [facility];  Service: General;  Laterality: Left;   LYMPH NODE DISSECTION N/A 03/19/2022   Procedure: LYMPH NODE DISSECTION;  Surgeon: [name], MD;  Location: [facility];  Service: Gynecology;  Laterality: N/A;   NASAL SEPTUM SURGERY     ROBOTIC ASSISTED TOTAL HYSTERECTOMY WITH BILATERAL SALPINGO OOPHERECTOMY Bilateral 03/19/2022   Procedure: XI ROBOTIC ASSISTED TOTAL HYSTERECTOMY WITH BILATERAL SALPINGO OOPHORECTOMY;  Surgeon: [name], MD;  Location: [facility];  Service: Gynecology;  Laterality: Bilateral;   SENTINEL NODE BIOPSY N/A 03/19/2022   Procedure: SENTINEL NODE BIOPSY;  Surgeon: [name], MD;  Location: [facility];  Service:  Gynecology;  Laterality: N/A;   SHOULDER ARTHROSCOPY WITH ROTATOR CUFF REPAIR AND OPEN BICEPS TENODESIS Right 07/15/2022   Procedure: RIGHT SHOULDER ARTHROSCOPY WITH ROTATOR CUFF REPAIR AND OPEN BICEPS TENODESIS;  Surgeon: [name], MD;  Location: [facility];  Service: Orthopedics;  Laterality: Right;   TUBAL LIGATION     WISDOM TOOTH EXTRACTION     Patient Active Problem List   Diagnosis Date Noted   Traumatic complete tear of right rotator cuff    Biceps tendinitis of right upper extremity    Endometrial cancer ([place]) 03/07/2022   PMB (postmenopausal bleeding) 03/07/2022   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   Flat epithelial atypia of breast, left    Atypical lobular hyperplasia of left breast 11/28/2021   History of UTI 11/07/2021   Gross hematuria 10/16/2021   Chronic back pain 12/19/2014   Edema 04/16/2014   Acute sinusitis 01/13/2014   Perimenopause 08/22/2013   Morbid obesity ([place]) 08/22/2013   Unspecified vitamin D deficiency 05/19/2013   Neuropathy 05/19/2013   Fibromyalgia 05/19/2013   Asthma with acute exacerbation 04/21/2013    PCP: Sallee Lange, MD  REFERRING PROVIDER: Sallee Lange, MD  REFERRING DIAG:  Diagnosis  M54.40,G89.29 (ICD-10-CM) - Chronic low back pain with sciatica, sciatica laterality unspecified, unspecified back pain laterality    Rationale  for Evaluation and Treatment: Rehabilitation  THERAPY DIAG:  Low back pain, unspecified back pain laterality, unspecified chronicity, unspecified whether sciatica present  ONSET DATE: about a year; after a fall  SUBJECTIVE:                                                                                                                                                                                           SUBJECTIVE STATEMENT: "Pretty much the same". Has increased soreness with cold weather. Doing ok with HEP.  EVAL: Diagnosis  M54.40,G89.29 (ICD-10-CM) -  Chronic low back pain with sciatica, sciatica laterality unspecified, unspecified back pain laterality  Pain in low back; off and on down into her left leg; she fell about a year ago; slipped and fell off her porch; that seems to exacerbate her back pain and leg pain.    PERTINENT HISTORY:  Here recently for OT for s/p RTC per Dr. Sammuel Hines; surgery in June Cancer breast  fibromyalgia  PAIN:  Are you having pain? Yes: NPRS scale: 6/10 Pain location: low back Pain description: aches Aggravating factors: walk a lot, prolonged standing Relieving factors: sitting, rest  PRECAUTIONS: None  WEIGHT BEARING RESTRICTIONS: No  FALLS:  Has patient fallen in last 6 months? No  LIVING ENVIRONMENT: Lives with: lives with their spouse Lives in: House/apartment Stairs: Yes: External: 5 steps; on right going up, on left going up, and can reach both Has following equipment at home: Single point cane and Crutches  OCCUPATION: does not work  PLOF: Independent  PATIENT GOALS: less back pain  NEXT MD VISIT: saw him yesterday; has an appt to see him next month  OBJECTIVE:   DIAGNOSTIC FINDINGS:  Right fibula fracture back in April  PATIENT SURVEYS:  Modified Oswestry 32/50, 64%    COGNITION: Overall cognitive status: Within functional limits for tasks assessed     SENSATION: Back numbness if she stands or walks for a long time  POSTURE: rounded shoulders, forward head, and increased lumbar lordosis  PALPATION: Tender paraspinals bilateral thoracic and lumbar spine  LUMBAR ROM:   AROM eval  Flexion 70% available *  Extension 30% available*  Right lateral flexion   Left lateral flexion   Right rotation   Left rotation    (Blank rows = not tested)   LOWER EXTREMITY MMT:    MMT Right eval Left eval  Hip flexion 4- 3+  Hip extension  2+ 2+  Hip abduction    Hip adduction    Hip internal rotation    Hip external rotation    Knee flexion 3- 3-  Knee extension 3+ 3+   Ankle dorsiflexion  4- 4-  Ankle plantarflexion    Ankle inversion    Ankle eversion     (Blank rows = not tested)  FUNCTIONAL TESTS:  5 times sit to stand: 55 sec  GAIT: Distance walked: 50 ft Assistive device utilized: None Level of assistance: Modified independence Comments: slower than normal gait speed  TODAY'S TREATMENT:                                                                                                                            DATE:  01/26/23 Supine Transversus Abdominis Bracing 5" hold x 10 Decompression exercises 5" hold x 10 head press, shoulder press, leg press and leg lengthener Bridge x 10 SLR x10 each   Band rows RTB 2 x 10  Shoulder extension RTB 2 x 10  01/21/23 Review of HEP and goals Seated: Scapular retraction 5" hold x 10  Supine: LTR 2" hold x 10 Supine Transversus Abdominis Bracing 5" hold x 10 Decompression exercises 5" hold x 10 head press, shoulder press, leg press and leg lengthener Bridge x 10    Physical therapy evaluation and HEP instruction    PATIENT EDUCATION:  Education details: updated HEP Person educated: Patient Education method: Consulting civil engineer, Demonstration, and Handouts Education comprehension: verbalized understanding, returned demonstration, verbal cues required, and tactile cues required   HOME EXERCISE PROGRAM:  01/26/23 - Supine Straight Leg Raises  - 2 x daily - 7 x weekly - 2 sets - 10 reps - Standing Shoulder Row with Anchored Resistance  - 2 x daily - 7 x weekly - 2 sets - 10 reps - Shoulder extension with resistance - Neutral  - 2 x daily - 7 x weekly - 2 sets - 10 reps  01/21/23 decompression exercises; bridge  Access Code: [email or web address] URL: [email or web address] Date: 01/02/2023 Prepared by: [facility]  Exercises - Seated Scapular Retraction  - 2 x daily - 7 x weekly - 1 sets - 10 reps - 5 sec hold - Supine Lower Trunk Rotation  - 2 x daily - 7 x weekly - 1 sets - 10 reps - Supine  Transversus Abdominis Bracing - Hands on Stomach  - 2 x daily - 7 x weekly - 1 sets - 10 reps - 5 sec hold  ASSESSMENT:  CLINICAL IMPRESSION: Patient tolerated session well today with no increased complaint of back pain. Progressed LE strengthening on mat and also progressed to standing core/ spine strengthening exercise with added and rows and shoulder extension. Patient educated on purpose and function of all added exercise and cues on hand position during rows and extensions. Added to HEP and issued handout. Patient will continue to benefit from skilled therapy services to reduce remaining deficits and improve functional ability.     OBJECTIVE IMPAIRMENTS: Abnormal gait, decreased activity tolerance, decreased endurance, decreased mobility, difficulty walking, decreased ROM, decreased strength, hypomobility, increased fascial restrictions, impaired perceived functional ability, impaired flexibility, impaired sensation, and pain.   ACTIVITY LIMITATIONS: carrying,  lifting, bending, sitting, standing, squatting, sleeping, stairs, transfers, bed mobility, bathing, dressing, reach over head, hygiene/grooming, locomotion level, and caring for others  PARTICIPATION LIMITATIONS: meal prep, cleaning, laundry, medication management, shopping, and community activity  PERSONAL FACTORS: 1 comorbidity: fibromyalgia  are also affecting patient's functional outcome.   REHAB POTENTIAL: Good  CLINICAL DECISION MAKING: Stable/uncomplicated  EVALUATION COMPLEXITY: Low   GOALS: Goals reviewed with patient? No  SHORT TERM GOALS: Target date: 01/16/2023  patient will be independent with initial HEP  Baseline: Goal status: IN PROGRESS  2.  Patient will self report 30% improvement to improve tolerance for functional activity   Baseline:  Goal status: IN PROGRESS   LONG TERM GOALS: Target date: 01/30/2023  Patient will be independent in self management strategies to improve quality of life and  functional outcomes. Baseline:  Goal status: IN PROGRESS  2.  Patient will self report 50% improvement to improve tolerance for functional activity   Baseline:  Goal status: IN PROGRESS  3.  Patient will improve Modified Oswestry  score by 4 points to 28 to demonstrate improved tolerance for functional activity   Baseline: 28 Goal status: IN PROGRESS  4.  Patient will improve 5 times sit to stand score from 55 sec to 30 sec or less to demonstrate improved functional mobility and increased lower extremity strength.  Baseline: 55 sec Goal status: IN PROGRESS  5.  Patient will increase  leg MMTs to 4+ to 5/5 without pain to promote return to ambulation community distances with minimal deviation.  Baseline:  Goal status: IN PROGRESS   PLAN:  PT FREQUENCY: 2x/week  PT DURATION: 4 weeks  PLANNED INTERVENTIONS: Therapeutic exercises, Therapeutic activity, Neuromuscular re-education, Balance training, Gait training, Patient/Family education, Joint manipulation, Joint mobilization, Stair training, Orthotic/Fit training, DME instructions, Aquatic Therapy, Dry Needling, Electrical stimulation, Spinal manipulation, Spinal mobilization, Cryotherapy, Moist heat, Compression bandaging, scar mobilization, Splintting, Taping, Traction, Ultrasound, Ionotophoresis '4mg'$ /ml Dexamethasone, and Manual therapy .  PLAN FOR NEXT SESSION: progress lumbar mobility and core and Lower extremity strength as able  2:27 PM, 01/26/23 Josue Hector PT DPT  Physical Therapist with [facility]  [phone]

## 2023-01-28 ENCOUNTER — Ambulatory Visit (HOSPITAL_COMMUNITY): Payer: Medicaid Other

## 2023-01-28 DIAGNOSIS — M545 Low back pain, unspecified: Secondary | ICD-10-CM

## 2023-01-28 DIAGNOSIS — M5442 Lumbago with sciatica, left side: Secondary | ICD-10-CM | POA: Diagnosis not present

## 2023-01-28 DIAGNOSIS — G8929 Other chronic pain: Secondary | ICD-10-CM | POA: Diagnosis not present

## 2023-01-28 NOTE — Therapy (Signed)
OUTPATIENT PHYSICAL THERAPY THORACOLUMBAR TREATMENT  Patient Name: Tammy Calderon MRN: [id number] DOB:28-Jan-1966, 57 y.o., female Today's Date: 01/28/2023  END OF SESSION:  PT End of Session - 01/28/23 1340     Visit Number 4    Number of Visits 8    Date for PT Re-Evaluation 01/30/23    Authorization Type Medicaid [employer]    Authorization Time Period 8 visits from 01/02/23 to 03/02/23    Authorization - Visit Number 3    Authorization - Number of Visits 8    Progress Note Due on Visit 8    PT Start Time 1340    PT Stop Time 1420    PT Time Calculation (min) 40 min    Activity Tolerance Patient tolerated treatment well    Behavior During Therapy WFL for tasks assessed/performed             Past Medical History:  Diagnosis Date   Anemia    past   Asthma    uses albuterol approx 1-2x a week   Bell's palsy    resolved   Breast cancer ([place])    Fibromyalgia    GERD (gastroesophageal reflux disease)    Hypertension    PONV (postoperative nausea and vomiting)    Past Surgical History:  Procedure Laterality Date   ABDOMINAL HYSTERECTOMY     BREAST BIOPSY WITH RADIO FREQUENCY LOCALIZER Left 12/27/2021   Procedure: EXCISIONAL BREAST BIOPSY WITH RADIO FREQUENCY LOCALIZER X2;  Surgeon: Virl Cagey, MD;  Location: [facility];  Service: General;  Laterality: Left;   LYMPH NODE DISSECTION N/A 03/19/2022   Procedure: LYMPH NODE DISSECTION;  Surgeon: [name], MD;  Location: [facility];  Service: Gynecology;  Laterality: N/A;   NASAL SEPTUM SURGERY     ROBOTIC ASSISTED TOTAL HYSTERECTOMY WITH BILATERAL SALPINGO OOPHERECTOMY Bilateral 03/19/2022   Procedure: XI ROBOTIC ASSISTED TOTAL HYSTERECTOMY WITH BILATERAL SALPINGO OOPHORECTOMY;  Surgeon: [name], MD;  Location: [facility];  Service: Gynecology;  Laterality: Bilateral;   SENTINEL NODE BIOPSY N/A 03/19/2022   Procedure: SENTINEL NODE BIOPSY;  Surgeon: [name], MD;  Location[facility];  Service:  Gynecology;  Laterality: N/A;   SHOULDER ARTHROSCOPY WITH ROTATOR CUFF REPAIR AND OPEN BICEPS TENODESIS Right 07/15/2022   Procedure: RIGHT SHOULDER ARTHROSCOPY WITH ROTATOR CUFF REPAIR AND OPEN BICEPS TENODESIS;  Surgeon: [name], MD;  Location: [facility];  Service: Orthopedics;  Laterality: Right;   TUBAL LIGATION     WISDOM TOOTH EXTRACTION     Patient Active Problem List   Diagnosis Date Noted   Traumatic complete tear of right rotator cuff    Biceps tendinitis of right upper extremity    Endometrial cancer ([place]) 03/07/2022   PMB (postmenopausal bleeding) 03/07/2022   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   Flat epithelial atypia of breast, left    Atypical lobular hyperplasia of left breast 11/28/2021   History of UTI 11/07/2021   Gross hematuria 10/16/2021   Chronic back pain 12/19/2014   Edema 04/16/2014   Acute sinusitis 01/13/2014   Perimenopause 08/22/2013   Morbid obesity ([place]) 08/22/2013   Unspecified vitamin D deficiency 05/19/2013   Neuropathy 05/19/2013   Fibromyalgia 05/19/2013   Asthma with acute exacerbation 04/21/2013    PCP: Sallee Lange, MD  REFERRING PROVIDER: Sallee Lange, MD  REFERRING DIAG:  Diagnosis  M54.40,G89.29 (ICD-10-CM) - Chronic low back pain with sciatica, sciatica laterality unspecified, unspecified back pain laterality    Rationale  for Evaluation and Treatment: Rehabilitation  THERAPY DIAG:  Low back pain, unspecified back pain laterality, unspecified chronicity, unspecified whether sciatica present  Chronic bilateral low back pain with left-sided sciatica  ONSET DATE: about a year; after a fall  SUBJECTIVE:                                                                                                                                                                                           SUBJECTIVE STATEMENT: Still having problems; did a lot walking on Monday, went to visit a friend in  the hospital and did some shopping at a mall; had to sit down because she was having increased back pain.  Exercises going ok; did not do a lot of exercise yesterday due to increased pain.     EVAL: Diagnosis  M54.40,G89.29 (ICD-10-CM) - Chronic low back pain with sciatica, sciatica laterality unspecified, unspecified back pain laterality  Pain in low back; off and on down into her left leg; she fell about a year ago; slipped and fell off her porch; that seems to exacerbate her back pain and leg pain.    PERTINENT HISTORY:  Here recently for OT for s/p RTC per Dr. Sammuel Hines; surgery in June Cancer breast  fibromyalgia  PAIN:  Are you having pain? Yes: NPRS scale: 6/10 Pain location: low back Pain description: aches Aggravating factors: walk a lot, prolonged standing Relieving factors: sitting, rest  PRECAUTIONS: None  WEIGHT BEARING RESTRICTIONS: No  FALLS:  Has patient fallen in last 6 months? No  LIVING ENVIRONMENT: Lives with: lives with their spouse Lives in: House/apartment Stairs: Yes: External: 5 steps; on right going up, on left going up, and can reach both Has following equipment at home: Single point cane and Crutches  OCCUPATION: does not work  PLOF: Independent  PATIENT GOALS: less back pain  NEXT MD VISIT: saw him yesterday; has an appt to see him next month  OBJECTIVE:   DIAGNOSTIC FINDINGS:  Right fibula fracture back in April  PATIENT SURVEYS:  Modified Oswestry 32/50, 64%    COGNITION: Overall cognitive status: Within functional limits for tasks assessed     SENSATION: Back numbness if she stands or walks for a long time  POSTURE: rounded shoulders, forward head, and increased lumbar lordosis  PALPATION: Tender paraspinals bilateral thoracic and lumbar spine  LUMBAR ROM:   AROM eval  Flexion 70% available *  Extension 30% available*  Right lateral flexion   Left lateral flexion   Right rotation   Left rotation    (Blank rows = not  tested)   LOWER EXTREMITY MMT:    MMT Right  eval Left eval  Hip flexion 4- 3+  Hip extension  2+ 2+  Hip abduction    Hip adduction    Hip internal rotation    Hip external rotation    Knee flexion 3- 3-  Knee extension 3+ 3+  Ankle dorsiflexion 4- 4-  Ankle plantarflexion    Ankle inversion    Ankle eversion     (Blank rows = not tested)  FUNCTIONAL TESTS:  5 times sit to stand: 55 sec  GAIT: Distance walked: 50 ft Assistive device utilized: None Level of assistance: Modified independence Comments: slower than normal gait speed  TODAY'S TREATMENT:                                                                                                                            DATE: 01/28/23 Supine: LTR x 10 Abdominal bracing 5" hold x 10 Decompression exercise head press, shoulder press, leg press, leg lengthener all 5" hold x 10; added thera band arms and shoulder horizontal abduction x 10 each with RTB Bridge x 10     01/26/23 Supine Transversus Abdominis Bracing 5" hold x 10 Decompression exercises 5" hold x 10 head press, shoulder press, leg press and leg lengthener Bridge x 10 SLR x10 each   Band rows RTB 2 x 10  Shoulder extension RTB 2 x 10  01/21/23 Review of HEP and goals Seated: Scapular retraction 5" hold x 10  Supine: LTR 2" hold x 10 Supine Transversus Abdominis Bracing 5" hold x 10 Decompression exercises 5" hold x 10 head press, shoulder press, leg press and leg lengthener Bridge x 10    Physical therapy evaluation and HEP instruction    PATIENT EDUCATION:  Education details: updated HEP Person educated: Patient Education method: Consulting civil engineer, Demonstration, and Handouts Education comprehension: verbalized understanding, returned demonstration, verbal cues required, and tactile cues required   HOME EXERCISE PROGRAM: 01/28/23 decompression exercise with RTB 01/26/23 - Supine Straight Leg Raises  - 2 x daily - 7 x weekly - 2 sets - 10 reps -  Standing Shoulder Row with Anchored Resistance  - 2 x daily - 7 x weekly - 2 sets - 10 reps - Shoulder extension with resistance - Neutral  - 2 x daily - 7 x weekly - 2 sets - 10 reps  01/21/23 decompression exercises; bridge  Access Code: [email or web address] URL: [email or web address] Date: 01/02/2023 Prepared by: [facility]  Exercises - Seated Scapular Retraction  - 2 x daily - 7 x weekly - 1 sets - 10 reps - 5 sec hold - Supine Lower Trunk Rotation  - 2 x daily - 7 x weekly - 1 sets - 10 reps - Supine Transversus Abdominis Bracing - Hands on Stomach  - 2 x daily - 7 x weekly - 1 sets - 10 reps - 5 sec hold  ASSESSMENT:  CLINICAL IMPRESSION: Patient with report of increased pain beginning of this week with trying to do a little more  walking.  Discussed appropriate footwear with patient ; good support and cushion with standing and walking.  Progressed decompression exercises today without issue; updated HEP.  Patient still needs verbal cues for breathing and to tighten abs before bridging. Patient will continue to benefit from skilled therapy services to reduce remaining deficits and improve functional ability.     OBJECTIVE IMPAIRMENTS: Abnormal gait, decreased activity tolerance, decreased endurance, decreased mobility, difficulty walking, decreased ROM, decreased strength, hypomobility, increased fascial restrictions, impaired perceived functional ability, impaired flexibility, impaired sensation, and pain.   ACTIVITY LIMITATIONS: carrying, lifting, bending, sitting, standing, squatting, sleeping, stairs, transfers, bed mobility, bathing, dressing, reach over head, hygiene/grooming, locomotion level, and caring for others  PARTICIPATION LIMITATIONS: meal prep, cleaning, laundry, medication management, shopping, and community activity  PERSONAL FACTORS: 1 comorbidity: fibromyalgia  are also affecting patient's functional outcome.   REHAB POTENTIAL: Good  CLINICAL DECISION MAKING:  Stable/uncomplicated  EVALUATION COMPLEXITY: Low   GOALS: Goals reviewed with patient? No  SHORT TERM GOALS: Target date: 01/16/2023  patient will be independent with initial HEP  Baseline: Goal status: IN PROGRESS  2.  Patient will self report 30% improvement to improve tolerance for functional activity   Baseline:  Goal status: IN PROGRESS   LONG TERM GOALS: Target date: 01/30/2023  Patient will be independent in self management strategies to improve quality of life and functional outcomes. Baseline:  Goal status: IN PROGRESS  2.  Patient will self report 50% improvement to improve tolerance for functional activity   Baseline:  Goal status: IN PROGRESS  3.  Patient will improve Modified Oswestry  score by 4 points to 28 to demonstrate improved tolerance for functional activity   Baseline: 28 Goal status: IN PROGRESS  4.  Patient will improve 5 times sit to stand score from 55 sec to 30 sec or less to demonstrate improved functional mobility and increased lower extremity strength.  Baseline: 55 sec Goal status: IN PROGRESS  5.  Patient will increase  leg MMTs to 4+ to 5/5 without pain to promote return to ambulation community distances with minimal deviation.  Baseline:  Goal status: IN PROGRESS   PLAN:  PT FREQUENCY: 2x/week  PT DURATION: 4 weeks  PLANNED INTERVENTIONS: Therapeutic exercises, Therapeutic activity, Neuromuscular re-education, Balance training, Gait training, Patient/Family education, Joint manipulation, Joint mobilization, Stair training, Orthotic/Fit training, DME instructions, Aquatic Therapy, Dry Needling, Electrical stimulation, Spinal manipulation, Spinal mobilization, Cryotherapy, Moist heat, Compression bandaging, scar mobilization, Splintting, Taping, Traction, Ultrasound, Ionotophoresis '4mg'$ /ml Dexamethasone, and Manual therapy .  PLAN FOR NEXT SESSION: progress lumbar mobility and core and Lower extremity strength as able; reassess  next visit   2:21 PM, 01/28/23 Nishka Heide Small Kassidy Frankson MPT [place] [facility] [place] [phone]

## 2023-02-02 ENCOUNTER — Ambulatory Visit (HOSPITAL_COMMUNITY): Payer: Medicaid Other

## 2023-02-02 DIAGNOSIS — G8929 Other chronic pain: Secondary | ICD-10-CM | POA: Diagnosis not present

## 2023-02-02 DIAGNOSIS — M545 Low back pain, unspecified: Secondary | ICD-10-CM

## 2023-02-02 DIAGNOSIS — M5442 Lumbago with sciatica, left side: Secondary | ICD-10-CM | POA: Diagnosis not present

## 2023-02-02 NOTE — Therapy (Signed)
OUTPATIENT PHYSICAL THERAPY THORACOLUMBAR TREATMENT PHYSICAL THERAPY DISCHARGE SUMMARY  Visits from Start of Care: 5  Current functional level related to goals / functional outcomes: See below   Remaining deficits: See below   Education / Equipment: See below   Patient agrees to discharge. Patient goals were partially met. Patient is being discharged due to lack of progress.   Patient Name: Tammy Calderon MRN: [id number] DOB:[birth year], 57 y.o., female Today's Date: 02/02/2023  END OF SESSION:  PT End of Session - 02/02/23 1347     Visit Number 5    Number of Visits 8    Date for PT Re-Evaluation 01/30/23    Authorization Type Medicaid [employer]    Authorization Time Period 8 visits from 01/02/23 to 03/02/23    Authorization - Visit Number 4    Authorization - Number of Visits 8    Progress Note Due on Visit 8    PT Start Time [id number]    PT Stop Time 1425    PT Time Calculation (min) 40 min    Activity Tolerance Patient tolerated treatment well    Behavior During Therapy WFL for tasks assessed/performed             Past Medical History:  Diagnosis Date   Anemia    past   Asthma    uses albuterol approx 1-2x a week   Bell's palsy    resolved   Breast cancer ([place])    Fibromyalgia    GERD (gastroesophageal reflux disease)    Hypertension    PONV (postoperative nausea and vomiting)    Past Surgical History:  Procedure Laterality Date   ABDOMINAL HYSTERECTOMY     BREAST BIOPSY WITH RADIO FREQUENCY LOCALIZER Left 12/27/2021   Procedure: EXCISIONAL BREAST BIOPSY WITH RADIO FREQUENCY LOCALIZER X2;  Surgeon: Virl Cagey, MD;  Location: [facility];  Service: General;  Laterality: Left;   LYMPH NODE DISSECTION N/A 03/19/2022   Procedure: LYMPH NODE DISSECTION;  Surgeon: [name], MD;  Location: [facility];  Service: Gynecology;  Laterality: N/A;   NASAL SEPTUM SURGERY     ROBOTIC ASSISTED TOTAL HYSTERECTOMY WITH BILATERAL SALPINGO OOPHERECTOMY Bilateral  03/19/2022   Procedure: XI ROBOTIC ASSISTED TOTAL HYSTERECTOMY WITH BILATERAL SALPINGO OOPHORECTOMY;  Surgeon: [name], MD;  Location: [facility];  Service: Gynecology;  Laterality: Bilateral;   SENTINEL NODE BIOPSY N/A 03/19/2022   Procedure: SENTINEL NODE BIOPSY;  Surgeon: [name], MD;  Location: [facility];  Service: Gynecology;  Laterality: N/A;   SHOULDER ARTHROSCOPY WITH ROTATOR CUFF REPAIR AND OPEN BICEPS TENODESIS Right 07/15/2022   Procedure: RIGHT SHOULDER ARTHROSCOPY WITH ROTATOR CUFF REPAIR AND OPEN BICEPS TENODESIS;  Surgeon: [name], MD;  Location: [facility];  Service: Orthopedics;  Laterality: Right;   TUBAL LIGATION     WISDOM TOOTH EXTRACTION     Patient Active Problem List   Diagnosis Date Noted   Traumatic complete tear of right rotator cuff    Biceps tendinitis of right upper extremity    Endometrial cancer ([place]) 03/07/2022   PMB (postmenopausal bleeding) 03/07/2022   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   Flat epithelial atypia of breast, left    Atypical lobular hyperplasia of left breast 11/28/2021   History of UTI 11/07/2021   Gross hematuria 10/16/2021   Chronic back pain 12/19/2014   Edema 04/16/2014   Acute sinusitis 01/13/2014   Perimenopause 08/22/2013   Morbid obesity ([place]) 08/22/2013   Unspecified vitamin  D deficiency 05/19/2013   Neuropathy 05/19/2013   Fibromyalgia 05/19/2013   Asthma with acute exacerbation 04/21/2013    PCP: Sallee Lange, MD  REFERRING PROVIDER: Sallee Lange, MD  REFERRING DIAG:  Diagnosis  M54.40,G89.29 (ICD-10-CM) - Chronic low back pain with sciatica, sciatica laterality unspecified, unspecified back pain laterality    Rationale for Evaluation and Treatment: Rehabilitation  THERAPY DIAG:  Low back pain, unspecified back pain laterality, unspecified chronicity, unspecified whether sciatica present - Plan: PT plan of care cert/re-cert  Chronic bilateral low  back pain with left-sided sciatica - Plan: PT plan of care cert/re-cert  ONSET DATE: about a year; after a fall  SUBJECTIVE:                                                                                                                                                                                           SUBJECTIVE STATEMENT: Honestly haven't seen very much change at all in my back; my legs feel a little stronger.  Pain today 7/10 slightly increased due to damp weather.   EVAL: Diagnosis  M54.40,G89.29 (ICD-10-CM) - Chronic low back pain with sciatica, sciatica laterality unspecified, unspecified back pain laterality  Pain in low back; off and on down into her left leg; she fell about a year ago; slipped and fell off her porch; that seems to exacerbate her back pain and leg pain.    PERTINENT HISTORY:  Here recently for OT for s/p RTC per Dr. Sammuel Hines; surgery in June Cancer breast  fibromyalgia  PAIN:  Are you having pain? Yes: NPRS scale: 7/10 Pain location: low back Pain description: aches Aggravating factors: walk a lot, prolonged standing Relieving factors: sitting, rest  PRECAUTIONS: None  WEIGHT BEARING RESTRICTIONS: No  FALLS:  Has patient fallen in last 6 months? No  LIVING ENVIRONMENT: Lives with: lives with their spouse Lives in: House/apartment Stairs: Yes: External: 5 steps; on right going up, on left going up, and can reach both Has following equipment at home: Single point cane and Crutches  OCCUPATION: does not work  PLOF: Independent  PATIENT GOALS: less back pain  NEXT MD VISIT: saw him yesterday; has an appt to see him next month  OBJECTIVE:   DIAGNOSTIC FINDINGS:  Right fibula fracture back in April  PATIENT SURVEYS:  Modified Oswestry 32/50, 64%    COGNITION: Overall cognitive status: Within functional limits for tasks assessed     SENSATION: Back numbness if she stands or walks for a long time  POSTURE: rounded shoulders, forward  head, and increased lumbar lordosis  PALPATION: Tender paraspinals bilateral thoracic and lumbar spine  LUMBAR ROM:   AROM  eval 02/02/23  Flexion 70% available *   Extension 30% available*   Right lateral flexion    Left lateral flexion    Right rotation    Left rotation     (Blank rows = not tested)   LOWER EXTREMITY MMT:    MMT Right eval Left eval Right 02/02/23 Left 02/02/23  Hip flexion 4- 3+ 4 4-  Hip extension  2+ 2+ 3+ 3-  Hip abduction      Hip adduction      Hip internal rotation      Hip external rotation      Knee flexion 3- 3- 4 3+  Knee extension 3+ 3+ 4+ 4-  Ankle dorsiflexion 4- 4- 4- 4+  Ankle plantarflexion      Ankle inversion      Ankle eversion       (Blank rows = not tested)  FUNCTIONAL TESTS:  5 times sit to stand: 55 sec  GAIT: Distance walked: 50 ft Assistive device utilized: None Level of assistance: Modified independence Comments: slower than normal gait speed  TODAY'S TREATMENT:                                                                                                                            DATE: 02/02/23 Progress note Modified oswestry 31/50  62% 5 times sit to stand 38.58 sec MMT's see above Trial of prone lying x 1' Prone on elbows x 1' Prone press ups x 8 No decrease in pain with prone exercise  Supine: LTR x 10 Abdominal bracing 5" x 10 Decompression exercises head press and shoulder press 5" x 10 each    01/28/23 Supine: LTR x 10 Abdominal bracing 5" hold x 10 Decompression exercise head press, shoulder press, leg press, leg lengthener all 5" hold x 10; added thera band arms and shoulder horizontal abduction x 10 each with RTB Bridge x 10     01/26/23 Supine Transversus Abdominis Bracing 5" hold x 10 Decompression exercises 5" hold x 10 head press, shoulder press, leg press and leg lengthener Bridge x 10 SLR x10 each   Band rows RTB 2 x 10  Shoulder extension RTB 2 x 10  01/21/23 Review of HEP  and goals Seated: Scapular retraction 5" hold x 10  Supine: LTR 2" hold x 10 Supine Transversus Abdominis Bracing 5" hold x 10 Decompression exercises 5" hold x 10 head press, shoulder press, leg press and leg lengthener Bridge x 10    Physical therapy evaluation and HEP instruction    PATIENT EDUCATION:  Education details: updated HEP Person educated: Patient Education method: Consulting civil engineer, Demonstration, and Handouts Education comprehension: verbalized understanding, returned demonstration, verbal cues required, and tactile cues required   HOME EXERCISE PROGRAM: 01/28/23 decompression exercise with RTB 01/26/23 - Supine Straight Leg Raises  - 2 x daily - 7 x weekly - 2 sets - 10 reps - Standing Shoulder Row with Anchored Resistance  - 2 x daily -  7 x weekly - 2 sets - 10 reps - Shoulder extension with resistance - Neutral  - 2 x daily - 7 x weekly - 2 sets - 10 reps  01/21/23 decompression exercises; bridge  Access Code: [email or web address] URL: [email or web address] Date: 01/02/2023 Prepared by: [facility]  Exercises - Seated Scapular Retraction  - 2 x daily - 7 x weekly - 1 sets - 10 reps - 5 sec hold - Supine Lower Trunk Rotation  - 2 x daily - 7 x weekly - 1 sets - 10 reps - Supine Transversus Abdominis Bracing - Hands on Stomach  - 2 x daily - 7 x weekly - 1 sets - 10 reps - 5 sec hold  ASSESSMENT:  CLINICAL IMPRESSION: Progress note today.  Only one point improvement in Modified Oswestry.  Good improvement overall in leg strength.  Trial of prone lying today; prone press ups did not centralize or improve symptoms.  Patient feels the more she does the more she hurts.  Some improvement in sit to stand test but did not achieve goal. Discussed with patient her trying to get into a pool and do some walking for pain relief. Patient is agreeable to discharge at this time.     OBJECTIVE IMPAIRMENTS: Abnormal gait, decreased activity tolerance, decreased endurance,  decreased mobility, difficulty walking, decreased ROM, decreased strength, hypomobility, increased fascial restrictions, impaired perceived functional ability, impaired flexibility, impaired sensation, and pain.   ACTIVITY LIMITATIONS: carrying, lifting, bending, sitting, standing, squatting, sleeping, stairs, transfers, bed mobility, bathing, dressing, reach over head, hygiene/grooming, locomotion level, and caring for others  PARTICIPATION LIMITATIONS: meal prep, cleaning, laundry, medication management, shopping, and community activity  PERSONAL FACTORS: 1 comorbidity: fibromyalgia  are also affecting patient's functional outcome.   REHAB POTENTIAL: Good  CLINICAL DECISION MAKING: Stable/uncomplicated  EVALUATION COMPLEXITY: Low   GOALS: Goals reviewed with patient? No  SHORT TERM GOALS: Target date: 01/16/2023  patient will be independent with initial HEP  Baseline: Goal status: MET  2.  Patient will self report 30% improvement to improve tolerance for functional activity   Baseline:  Goal status: IN PROGRESS   LONG TERM GOALS: Target date: 01/30/2023  Patient will be independent in self management strategies to improve quality of life and functional outcomes. Baseline:  Goal status: IN PROGRESS  2.  Patient will self report 50% improvement to improve tolerance for functional activity   Baseline:  Goal status: IN PROGRESS  3.  Patient will improve Modified Oswestry  score by 4 points to 28 to demonstrate improved tolerance for functional activity   Baseline: 28; 31/50 ( 1 point improvement) 02/02/23 Goal status: IN PROGRESS  4.  Patient will improve 5 times sit to stand score from 55 sec to 30 sec or less to demonstrate improved functional mobility and increased lower extremity strength.  Baseline: 55 sec; 38 sec 02/02/23 Goal status: IN PROGRESS  5.  Patient will increase  leg MMTs to 4+ to 5/5 without pain to promote return to ambulation community distances  with minimal deviation.  Baseline:  Goal status: IN PROGRESS   PLAN:  PT FREQUENCY: 2x/week  PT DURATION: 4 weeks  PLANNED INTERVENTIONS: Therapeutic exercises, Therapeutic activity, Neuromuscular re-education, Balance training, Gait training, Patient/Family education, Joint manipulation, Joint mobilization, Stair training, Orthotic/Fit training, DME instructions, Aquatic Therapy, Dry Needling, Electrical stimulation, Spinal manipulation, Spinal mobilization, Cryotherapy, Moist heat, Compression bandaging, scar mobilization, Splintting, Taping, Traction, Ultrasound, Ionotophoresis 83m/ml Dexamethasone, and Manual therapy .  PLAN FOR NEXT SESSION: discharge 2:24 PM,  02/02/23 Sherika Kubicki Small Gianny Sabino MPT [place] [facility] [place] [phone]

## 2023-02-05 ENCOUNTER — Encounter (HOSPITAL_COMMUNITY): Payer: Medicaid Other

## 2023-02-11 ENCOUNTER — Other Ambulatory Visit (HOSPITAL_COMMUNITY)
Admission: RE | Admit: 2023-02-11 | Discharge: 2023-02-11 | Disposition: A | Discharge status: Home / Self Care | Payer: Medicaid Other | Source: Ambulatory Visit | Attending: Family Medicine | Admitting: Family Medicine

## 2023-02-11 DIAGNOSIS — M2550 Pain in unspecified joint: Secondary | ICD-10-CM | POA: Insufficient documentation

## 2023-02-11 DIAGNOSIS — M797 Fibromyalgia: Secondary | ICD-10-CM | POA: Insufficient documentation

## 2023-02-11 LAB — SEDIMENTATION RATE: Sed Rate: 30 mm/hr — ABNORMAL HIGH (ref 0–22)

## 2023-02-12 ENCOUNTER — Ambulatory Visit (HOSPITAL_COMMUNITY)
Admission: RE | Admit: 2023-02-12 | Discharge: 2023-02-12 | Disposition: A | Discharge status: Home / Self Care | Payer: Medicaid Other | Source: Ambulatory Visit | Attending: Family Medicine | Admitting: Family Medicine

## 2023-02-12 ENCOUNTER — Ambulatory Visit: Payer: Medicaid Other | Admitting: Family Medicine

## 2023-02-12 VITALS — BP 132/78 | HR 67 | Wt 243.6 lb

## 2023-02-12 DIAGNOSIS — F321 Major depressive disorder, single episode, moderate: Secondary | ICD-10-CM | POA: Diagnosis not present

## 2023-02-12 DIAGNOSIS — R29898 Other symptoms and signs involving the musculoskeletal system: Secondary | ICD-10-CM

## 2023-02-12 DIAGNOSIS — G8929 Other chronic pain: Secondary | ICD-10-CM

## 2023-02-12 DIAGNOSIS — M797 Fibromyalgia: Secondary | ICD-10-CM | POA: Diagnosis not present

## 2023-02-12 DIAGNOSIS — M25511 Pain in right shoulder: Secondary | ICD-10-CM | POA: Insufficient documentation

## 2023-02-12 DIAGNOSIS — M5440 Lumbago with sciatica, unspecified side: Secondary | ICD-10-CM | POA: Diagnosis not present

## 2023-02-12 LAB — ANA: Anti Nuclear Antibody (ANA): POSITIVE — AB

## 2023-02-12 NOTE — Progress Notes (Signed)
Subjective:    Patient ID: Tammy Calderon, female    DOB: January 31, 1966, 57 y.o.   MRN: [id number]  HPI Patient arrives today for 6 week follow up for lab work. Patient states no concerns or issues today.  Patient relates that she fell landed on her right shoulder and humerus relates pain and discomfort and difficulty moving the arm she has had previous surgery before by orthopedics in [place].  We will be in the process of referring her back there She states she is able to raise the arm somewhat but has significant pain and discomfort.  She denies any other particular troubles with it.  No numbness or tingling. Had Fall ( Dr Donivan Scull)  She also relates ongoing back pain discomfort along with some weakness into the legs.  We will connect with the orthopedist more than likely they will want her to see a physical medicine specialist.  I do not feel that further imaging is warranted right at the moment we will leave this up to the specialist  She also has a lot of stiffness in her joints aching in her muscles and feels fatigued and tired in addition to this has a difficult time moving around getting around difficult time doing basic housework because of this had recent lab work which showed a slight elevation of sed rate although for her age not terribly high.  ANA was positive but this is nonspecific   Review of Systems     Objective:   Physical Exam General-in no acute distress Eyes-no discharge Lungs-respiratory rate normal, CTA CV-no murmurs,RRR Extremities skin warm dry no edema Neuro grossly normal Behavior normal, alert Significant discomfort in the right shoulder decreased range of motion and tenderness       Assessment & Plan:   1. Acute pain of right shoulder X-rays indicated to make sure there is no fracture gentle range of motion exercises shown Referral to orthopedics  - DG Shoulder Right - DG Humerus Right  2. Depression, major, single episode, moderate  ([place]) She has significant depression related into sadness from social issues as well as she is adopted in apparently her mother biologic died of breast cancer which upset her - Ambulatory referral to [place]  3. Fibromyalgia She has significant fibromyalgia is being treated for this But also has muscle aches joint pains positive ANA She has an appointment with rheumatology in [place] We did discuss openly that if rheumatology examines her and does not feel she has a rheumatologic problem they will send her back to [place] for management of the fibromyalgia Rheumatology will help discern if there is lupus or other rheumatologic issues going on We also discussed how the ANA is not a specific test for lupus it is more diagnosed based on additional testing and syndrome criteria 4. Chronic low back pain with sciatica, sciatica laterality unspecified, unspecified back pain laterality She relates a lot of pain in her back she relates weakness in her legs to 1 side more than the other when she walks she states she is very impaired in her activity she is requesting an MRI.  When I try to localize her pain sometimes it is in the mid back sometimes upper neck area and always in the lower back Rather than doing an MRI of the full spine I believe a consultation with a physical medicine specialist is warranted She will follow-up in 4 months currently she is unable to work  Patient also relates that she will be doing a  phone call to oncology to see if she can get a female oncologist for her breast cancer if she states she would feel more comfortable seeing a female-I gave her the name of several hematology oncology in [place] with Elvina Sidle

## 2023-02-13 NOTE — Addendum Note (Signed)
Addended by: Vicente Males on: 02/13/2023 10:34 AM   Modules accepted: Orders

## 2023-02-13 NOTE — Progress Notes (Signed)
02/13/23-Referral placed in Epic

## 2023-02-16 ENCOUNTER — Encounter: Payer: Self-pay | Admitting: Gynecologic Oncology

## 2023-02-18 ENCOUNTER — Telehealth: Payer: Self-pay

## 2023-02-18 NOTE — Telephone Encounter (Signed)
Pt called stating she missed her appointment (6 month follow up in October)  she states she has been having bleeding for a few weeks.   Vaginal bleeding triage protocol reviewed with patient:  Bleeding, off and on, started a few weeks ago, bright red in color but only when she wipes. Not on a pad. No clots present. She does have dull pain on the left side in the ovary area. 4-5/10 on pain scale, relieved by Ibuprofen. She has not noticed anything that increases the pain and was not doing anything prior to bleeding starting. No fever/chills. No recent biopsy, but has had a total hysterectomy 02/2022.  Pt is scheduled with Joylene John NP on Friday 3/1

## 2023-02-19 ENCOUNTER — Encounter: Payer: Self-pay | Admitting: Radiology

## 2023-02-20 ENCOUNTER — Other Ambulatory Visit: Payer: Self-pay | Admitting: *Deleted

## 2023-02-20 ENCOUNTER — Inpatient Hospital Stay: Payer: Medicaid Other | Attending: Gynecologic Oncology | Admitting: Gynecologic Oncology

## 2023-02-20 ENCOUNTER — Other Ambulatory Visit: Payer: Self-pay

## 2023-02-20 ENCOUNTER — Telehealth: Payer: Self-pay

## 2023-02-20 ENCOUNTER — Inpatient Hospital Stay: Payer: Medicaid Other

## 2023-02-20 VITALS — BP 142/52 | HR 58 | Temp 98.0°F | Resp 16 | Ht 60.0 in | Wt 249.0 lb

## 2023-02-20 DIAGNOSIS — N939 Abnormal uterine and vaginal bleeding, unspecified: Secondary | ICD-10-CM

## 2023-02-20 DIAGNOSIS — N898 Other specified noninflammatory disorders of vagina: Secondary | ICD-10-CM | POA: Diagnosis not present

## 2023-02-20 DIAGNOSIS — Z8542 Personal history of malignant neoplasm of other parts of uterus: Secondary | ICD-10-CM | POA: Insufficient documentation

## 2023-02-20 DIAGNOSIS — Z90710 Acquired absence of both cervix and uterus: Secondary | ICD-10-CM | POA: Diagnosis not present

## 2023-02-20 DIAGNOSIS — R140 Abdominal distension (gaseous): Secondary | ICD-10-CM | POA: Insufficient documentation

## 2023-02-20 DIAGNOSIS — C541 Malignant neoplasm of endometrium: Secondary | ICD-10-CM

## 2023-02-20 DIAGNOSIS — Z90722 Acquired absence of ovaries, bilateral: Secondary | ICD-10-CM | POA: Insufficient documentation

## 2023-02-20 DIAGNOSIS — R58 Hemorrhage, not elsewhere classified: Secondary | ICD-10-CM

## 2023-02-20 LAB — URINALYSIS, COMPLETE (UACMP) WITH MICROSCOPIC
Bacteria, UA: NONE SEEN
Bilirubin Urine: NEGATIVE
Glucose, UA: NEGATIVE mg/dL
Hgb urine dipstick: NEGATIVE
Ketones, ur: NEGATIVE mg/dL
Nitrite: NEGATIVE
Protein, ur: NEGATIVE mg/dL
Specific Gravity, Urine: 1.025 (ref 1.005–1.030)
pH: 5 (ref 5.0–8.0)

## 2023-02-20 LAB — WET PREP, GENITAL
Clue Cells Wet Prep HPF POC: NONE SEEN
Sperm: NONE SEEN
Trich, Wet Prep: NONE SEEN
WBC, Wet Prep HPF POC: >=10 — AB (ref <10)
Yeast Wet Prep HPF POC: NONE SEEN

## 2023-02-20 NOTE — Patient Instructions (Addendum)
No obvious source of the bleeding you are experiencing on examination of the vulva and vagina. We will send a sample of your urine to test this as well. We also collected a sample of the discharge from the vagina. We will contact you with the results when ready.  Continue to monitor for bleeding and call the office with an update.   Recommendation would be for follow up in six months with your OB/GYN or our office for surveillance exams. If you do not have a GYN, please call our office at [phone] in late summer to schedule an appointment for September 2024 or sooner if needed.  Symptoms to report to your health care team include abnormal bleeding, rectal bleeding, bloating, weight loss without effort, new and persistent pain, new and  persistent fatigue, new leg swelling, new masses (i.e., bumps in your neck or groin), new and persistent cough, new and persistent nausea and vomiting, change in bowel or bladder habits, and any other concerns.

## 2023-02-20 NOTE — Progress Notes (Signed)
Gynecologic Oncology Symptom Management Visit  02/20/2023  Reason for Visit: Follow-up after surgery, treatment discussion  Treatment History: Oncology History Overview Note  MMR IHC intact MS stable   Endometrial cancer ([place])  02/17/2022 Initial Biopsy   EMB: Endometrioid carcinoma, FIGO grade 1.  Prolapsing endocervical mass-benign endocervical polyp.   02/28/2022 Imaging   Pelvic ultrasound shows abnormally thickened heterogenous endometrial complex measuring 18 mm, simple right ovarian cyst.   03/07/2022 Initial Diagnosis   Endometrial cancer ([place])   03/19/2022 Surgery   TRH/BSO, bilateral sentinel lymph node biopsy  Findings: On EUA, small mobile uterus, no adnexal masses. On intra-abdominal entry, normal upper abdominal survey.  Normal-appearing omentum, small and large bowel.  Filmy adhesions of the appendix to the sigmoid epiploica and right pelvic sidewall.  Clips noted on the fallopian tube consistent with prior tubal ligation.  Normal-appearing bilateral adnexa.  Uterus 8 cm and somewhat bulbous at the fundus, otherwise normal in appearance.  Mapping successful bilaterally to obturator sentinel lymph nodes.  Mildly enlarged lymph node on the left with enlarged adjacent lymph node that was also removed.  No obvious extrauterine tumor.  Mild adhesions between the bladder and lower uterine segment/cervix.   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 She is unsure when this started because her husband has been ill. The bleeding is described as bright red, intermittent. She had noted bleeding when straining to have a BM. Her last episode of bleeding was reported as a moderate amount. She has had intermittent vaginal discharge.  She has been tolerating her diet with no nausea or emesis. She reports having abdominal  bloating for the past month and does not correlate a new medication change or dietary change with its onset. In July 2023, she had right rotator cuff surgery. Unfortunately, she fell at home from water on the floor and landed on her right shoulder. She has had pain since that time. She states she had a negative xray with her PCP.  She continues to have occasional menstrual-like cramping that she describes as being similar to ovarian cramping. Bowel and bladder functioning without difficulty.   Past Medical/Surgical History: Past Medical History:  Diagnosis Date   Anemia    past   Asthma    uses albuterol approx 1-2x a week   Bell's palsy    resolved   Breast cancer ([facility])    Fibromyalgia    GERD (gastroesophageal reflux disease)    Hypertension    PONV (postoperative nausea and vomiting)     Past Surgical History:  Procedure Laterality Date   ABDOMINAL HYSTERECTOMY     BREAST BIOPSY WITH RADIO FREQUENCY LOCALIZER Left 12/27/2021   Procedure: EXCISIONAL BREAST BIOPSY WITH RADIO FREQUENCY LOCALIZER X2;  Surgeon: Virl Cagey, MD;  Location: [facility];  Service: General;  Laterality: Left;   LYMPH NODE DISSECTION N/A 03/19/2022   Procedure: LYMPH NODE DISSECTION;  Surgeon: [name], MD;  Location: [facility];  Service: Gynecology;  Laterality: N/A;   NASAL SEPTUM SURGERY     ROBOTIC ASSISTED TOTAL HYSTERECTOMY WITH BILATERAL SALPINGO OOPHERECTOMY Bilateral 03/19/2022   Procedure: XI ROBOTIC ASSISTED TOTAL HYSTERECTOMY WITH BILATERAL SALPINGO OOPHORECTOMY;  Surgeon: [name], MD;  Location: [facility];  Service: Gynecology;  Laterality: Bilateral;   SENTINEL NODE BIOPSY N/A 03/19/2022   Procedure: SENTINEL NODE BIOPSY;  Surgeon:  [name], MD;  Location: [facility];  Service: Gynecology;  Laterality: N/A;   SHOULDER ARTHROSCOPY WITH ROTATOR CUFF REPAIR AND OPEN BICEPS TENODESIS Right 07/15/2022   Procedure: RIGHT SHOULDER ARTHROSCOPY WITH ROTATOR CUFF REPAIR AND OPEN  BICEPS TENODESIS;  Surgeon: [name], MD;  Location: [facility];  Service: Orthopedics;  Laterality: Right;   TUBAL LIGATION     WISDOM TOOTH EXTRACTION      Family History  Adopted: Yes  Problem Relation Age of Onset   Breast cancer Mother    Breast cancer Sister     Social History   Socioeconomic History   Marital status: Married    Spouse name: Not on file   Number of children: Not on file   Years of education: Not on file   Highest education level: Not on file  Occupational History   Not on file  Tobacco Use   Smoking status: Never   Smokeless tobacco: Never  Vaping Use   Vaping Use: Never used  Substance and Sexual Activity   Alcohol use: No   Drug use: Never   Sexual activity: Not Currently    Birth control/protection: Post-menopausal  Other Topics Concern   Not on file  Social History Narrative   Not on file   Social Determinants of Health   Financial Resource Strain: Not on file  Food Insecurity: Not on file  Transportation Needs: Not on file  Physical Activity: Not on file  Stress: Not on file  Social Connections: Not on file    Current Medications:  Current Outpatient Medications:    albuterol (VENTOLIN HFA) 108 (90 Base) MCG/ACT inhaler, Inhale 2 puffs into the lungs every 6 (six) hours as needed for wheezing., Disp: 1 each, Rfl: 2   DULoxetine (CYMBALTA) 60 MG capsule, Take 1 capsule (60 mg total) by mouth daily., Disp: 30 capsule, Rfl: 3   famotidine (PEPCID) 40 MG tablet, Take 1 tablet (40 mg total) by mouth daily., Disp: 30 tablet, Rfl: 3   ibuprofen (ADVIL) 600 MG tablet, Take 1 tablet (600 mg total) by mouth every 8 (eight) hours as needed., Disp: 40 tablet, Rfl: 2   lisinopril-hydrochlorothiazide (ZESTORETIC) 20-12.5 MG tablet, TAKE 1 TABLET BY MOUTH ONCE DAILY . APPOINTMENT REQUIRED FOR FUTURE REFILLS, Disp: 30 tablet, Rfl: 5  Review of Systems: See interval hx. Additional review negative.  Physical Exam: BP (!)  142/52 (BP Location: Left Arm, Patient Position: Sitting)   Pulse (!) 58   Temp 98 F (36.7 C) (Oral)   Resp 16   Ht 5' (1.524 m)   Wt 249 lb (112.9 kg)   LMP 10/06/2011   SpO2 100%   BMI 48.63 kg/m  General: Well developed, well nourished female in no acute distress. Alert and oriented x 3.  HEENT: Normocephalic, atraumatic, sclera anicteric. Chest: Unlabored breathing on room air.  Neck: Supple without any enlargements.  Lymph node survey: No cervical, supraclavicular, or inguinal adenopathy.  Cardiovascular: Regular rate and rhythm. S1 and S2 normal.  Lungs: Clear to auscultation bilaterally. No wheezes/crackles/rhonchi noted.  Abdomen: Obese, soft, nontender.  Normoactive bowel sounds.  No masses or hepatosplenomegaly appreciated.  Well-healed incisions. Extremities: Grossly normal range of motion.  Warm, well perfused.  Trace edema bilaterally. Skin: No rashes or lesions noted. GU: Normal appearing external genitalia without erythema, excoriation, or lesions. No urethral lesions. No visible hemorrhoids or fissures of the anus. Speculum exam reveals no bleeding with cuff intact. Minimal amount of white discharge noted at the  cuff. Sample taken for wet prep. Bimanual exam reveals cuff intact, no fluctuance or tenderness with palpation, no masses palpated. Rectal exam confirms no masses. Stool noted in the rectum.   Laboratory & Radiologic Studies: None new  Assessment & Plan: Tammy Calderon is a 57 y.o. 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 No source of bleeding noted on today's exam. Urine sample obtained. Sample of vaginal discharge obtained for wet prep.  Per NCCN surveillance recommendations, it is recommended to plan on visits every 6 months.  No routine Pap smears are indicated in the setting of her endometrial cancer history.  We will plan to alternate visits between our clinic and her OB/GYN. Signs and symptoms  that would be concerning for cancer recurrence printed on AVS.  20 minutes of total time was spent for this patient encounter, including preparation, face-to-face counseling with the patient and coordination of care, and documentation of the encounter.  Joylene John, NP

## 2023-02-20 NOTE — Telephone Encounter (Signed)
Per Joylene John NP, I contacted Ms.Thal regarding Urine sample and vaginal swab from today.   Please let her know her urine showed no blood, no sign of infection. The vaginal discharge sample did not show yeast or signs of infection. There was slight increase in white blood cells but this can be from atrophy or changes to the vagina tissues with age and lack of estrogen. Recommend continuing to monitor the bleeding and call if she has it so we can see her same day if possible. Has she ever had a colonoscopy?  Would she be open to me placing a referral? She needs colon cancer screening and given the bleeding with no source on exam, I think she needs it   Pt is aware of above message from [facility].  She states she has not ever had a Colonoscopy and she will speak to her PCP and let them refer her to a GI doctor. She will call office if she starts bleeding again.

## 2023-02-21 DIAGNOSIS — N939 Abnormal uterine and vaginal bleeding, unspecified: Secondary | ICD-10-CM | POA: Insufficient documentation

## 2023-02-23 ENCOUNTER — Telehealth: Payer: Self-pay | Admitting: Surgery

## 2023-02-23 NOTE — Telephone Encounter (Signed)
Called patient to see if she had anymore bleeding over the weekend. Patient denies any bleeding over the weekend but states she started having pain in her L side last night. When it started it was 9/10 but she took ibuprofen and went to sleep. Today she describes pain as dull and 4/10, "not really bothering me that much." Pain starts in L side and travels down "to where my ovaries are." Advised patient to continue to monitor symptoms and that Dr Berline Lopes and Lenna Sciara would be notified and our office will call with any additional recommendations. Reminded patient that if bleeding occurs or symptoms worsen to call us back same day to be seen. Patient verbalized understanding and had no other concerns at this time.

## 2023-02-25 NOTE — Telephone Encounter (Signed)
LM for Tammy Calderon to call back to discuss how her pain is doing and if she is having any more bleeding.

## 2023-02-26 ENCOUNTER — Telehealth: Payer: Self-pay

## 2023-02-26 ENCOUNTER — Other Ambulatory Visit: Payer: Self-pay | Admitting: Gynecologic Oncology

## 2023-02-26 DIAGNOSIS — Z1211 Encounter for screening for malignant neoplasm of colon: Secondary | ICD-10-CM

## 2023-02-26 DIAGNOSIS — R58 Hemorrhage, not elsewhere classified: Secondary | ICD-10-CM

## 2023-02-26 DIAGNOSIS — R109 Unspecified abdominal pain: Secondary | ICD-10-CM

## 2023-02-26 DIAGNOSIS — C541 Malignant neoplasm of endometrium: Secondary | ICD-10-CM

## 2023-02-26 NOTE — Progress Notes (Signed)
See note from [place]. Patient now having moderate to severe left abdominal pain. Plan for CT to evaluate for signs of recurrence and plan for GI referral.

## 2023-02-26 NOTE — Telephone Encounter (Signed)
Per Joylene John NP, call to see how she is. May recommend GI referral again in case she has a colonic process going on.   Pt states she is not having any more bleeding, however, still has periodic left side pain. Last night and this morning it was probably 7/10 but has since subsided. She is fine with a referral to GI and is aware once referral is placed by our office she will get a call from the GI office.  She states an understanding.

## 2023-02-27 ENCOUNTER — Ambulatory Visit (INDEPENDENT_AMBULATORY_CARE_PROVIDER_SITE_OTHER): Payer: Medicaid Other | Admitting: Orthopaedic Surgery

## 2023-02-27 ENCOUNTER — Ambulatory Visit (HOSPITAL_BASED_OUTPATIENT_CLINIC_OR_DEPARTMENT_OTHER): Payer: Medicaid Other

## 2023-02-27 DIAGNOSIS — M79672 Pain in left foot: Secondary | ICD-10-CM

## 2023-02-27 DIAGNOSIS — M7501 Adhesive capsulitis of right shoulder: Secondary | ICD-10-CM | POA: Diagnosis not present

## 2023-02-27 MED ORDER — TRIAMCINOLONE ACETONIDE 40 MG/ML IJ SUSP
80.0000 mg | INTRAMUSCULAR | Status: AC | PRN
  Administered 2023-02-27: 80 mg via INTRA_ARTICULAR

## 2023-02-27 MED ORDER — LIDOCAINE HCL 1 % IJ SOLN
4.0000 mL | INTRAMUSCULAR | Status: AC | PRN
  Administered 2023-02-27: 4 mL

## 2023-02-27 NOTE — Telephone Encounter (Signed)
Spoke with the patient and explained per [place] moved to Monday due to her pain. Patient given the new date and time of 3/11 at 4 pm with an arrival time on 1:45 pm. Patient given instructions to use entrance C and nothing to eat/drink after 12 pm. Patient aware that the scan is authorized with her insurance. Explained to the patient that the results will release to her and the provider at the same time.   Patient stated that "Melissa wanted me to let her know when I had more bleeding. I had some this morning. It was after a bowel movement but is ws not from here. It was when I wiped my vagina. It was just spotting." Explained per Joylene John APP "since it is spotting will wait and see what the CT shows." Patient verbalized understanding.

## 2023-02-27 NOTE — Progress Notes (Signed)
Post Operative Evaluation    Procedure/Date of Surgery: Right shoulder rotator cuff repair 7/25  Interval History:   Presents today for follow-up of her right shoulder rotator cuff repair.  Unfortunately she has been essentially lost to follow-up as her husband is suffered a life-threatening Fournier gangrene case for which she has been tending to him daily.  She has not had time or any family support to be able to return for follow-up visit.  She is here today overall doing much better with regard to the right shoulder.  She did have a fall recently onto the right side where she is having some soreness in the right shoulder although her motion is essentially normalized and was pain-free prior to her fall.  She has been doing rehab in [place] although this was discontinued when her motion and pain had normalized    PMH/PSH/Family History/Social History/Meds/Allergies:    Past Medical History:  Diagnosis Date  . Anemia    past  . Asthma    uses albuterol approx 1-2x a week  . Bell's palsy    resolved  . Breast cancer ([place])   . Fibromyalgia   . GERD (gastroesophageal reflux disease)   . Hypertension   . PONV (postoperative nausea and vomiting)    Past Surgical History:  Procedure Laterality Date  . ABDOMINAL HYSTERECTOMY    . BREAST BIOPSY WITH RADIO FREQUENCY LOCALIZER Left 12/27/2021   Procedure: EXCISIONAL BREAST BIOPSY WITH RADIO FREQUENCY LOCALIZER X2;  Surgeon: Virl Cagey, MD;  Location: [facility];  Service: General;  Laterality: Left;  . LYMPH NODE DISSECTION N/A 03/19/2022   Procedure: LYMPH NODE DISSECTION;  Surgeon: [name], MD;  Location: [facility];  Service: Gynecology;  Laterality: N/A;  . NASAL SEPTUM SURGERY    . ROBOTIC ASSISTED TOTAL HYSTERECTOMY WITH BILATERAL SALPINGO OOPHERECTOMY Bilateral 03/19/2022   Procedure: XI ROBOTIC ASSISTED TOTAL HYSTERECTOMY WITH BILATERAL SALPINGO OOPHORECTOMY;  Surgeon: [name], MD;  Location: [facility];  Service: Gynecology;  Laterality: Bilateral;  . SENTINEL NODE BIOPSY N/A 03/19/2022   Procedure: SENTINEL NODE BIOPSY;  Surgeon: [name], MD;  [facility];  Service: Gynecology;  Laterality: N/A;  . SHOULDER ARTHROSCOPY WITH ROTATOR CUFF REPAIR AND OPEN BICEPS TENODESIS Right 07/15/2022   Procedure: RIGHT SHOULDER ARTHROSCOPY WITH ROTATOR CUFF REPAIR AND OPEN BICEPS TENODESIS;  Surgeon: [name], MD;  Location: [facility];  Service: Orthopedics;  Laterality: Right;  . TUBAL LIGATION    . WISDOM TOOTH EXTRACTION     Social History   Socioeconomic History  . Marital status: Married    Spouse name: Not on file  . Number of children: Not on file  . Years of education: Not on file  . Highest education level: Not on file  Occupational History  . Not on file  Tobacco Use  . Smoking status: Never  . Smokeless tobacco: Never  Vaping Use  . Vaping Use: Never used  Substance and Sexual Activity  . Alcohol use: No  . Drug use: Never  . Sexual activity: Not Currently    Birth control/protection: Post-menopausal  Other Topics Concern  . Not on file  Social History Narrative  . Not on file   Social Determinants of Health   Financial Resource Strain: Not on file  Food Insecurity:  Not on file  Transportation Needs: Not on file  Physical Activity: Not on file  Stress: Not on file  Social Connections: Not on file   Family History  Adopted: Yes  Problem Relation Age of Onset  . Breast cancer Mother   . Breast cancer Sister    Allergies  Allergen Reactions  . Misc. Sulfonamide Containing Compounds Anaphylaxis  . Sulfa Antibiotics Anaphylaxis  . Cymbalta [Duloxetine Hcl]     sedated  . Lyrica [Pregabalin]     drowsy  . Neurontin [Gabapentin]     drowsy   Current Outpatient Medications  Medication Sig Dispense Refill  . albuterol (VENTOLIN HFA) 108 (90 Base) MCG/ACT inhaler Inhale 2 puffs into the lungs every 6  (six) hours as needed for wheezing. 1 each 2  . DULoxetine (CYMBALTA) 60 MG capsule Take 1 capsule (60 mg total) by mouth daily. 30 capsule 3  . famotidine (PEPCID) 40 MG tablet Take 1 tablet (40 mg total) by mouth daily. 30 tablet 3  . ibuprofen (ADVIL) 600 MG tablet Take 1 tablet (600 mg total) by mouth every 8 (eight) hours as needed. 40 tablet 2  . lisinopril-hydrochlorothiazide (ZESTORETIC) 20-12.5 MG tablet TAKE 1 TABLET BY MOUTH ONCE DAILY . APPOINTMENT REQUIRED FOR FUTURE REFILLS 30 tablet 5   No current facility-administered medications for this visit.   No results found.  Review of Systems:   A ROS was performed including pertinent positives and negatives as documented in the HPI.   Musculoskeletal Exam:     Portal sites are healed.  Overall her active range of motion is to 160 degrees forward elevation which is equal to the contralateral side..  External rotation at side is to 55 degrees without pain which is equal to the contralateral side.  Internal rotation is to L1 bilaterally.  There is some radiating pain about the lateral deltoid.  Full strength in forward elevation equal to the contralateral side.  Remaining distal neurosensory exam is intact with 2+ radial pulse  Imaging:    None  I personally reviewed and interpreted the radiographs.   Assessment:   57 year old female who is 7 months status post right shoulder rotator cuff repair overall doing well.  Her range of motion as well as strength have now normalized equal to the contralateral side.  She is having some soreness about the glenohumeral joint with mild pain with passive external rotation consistent with early adhesive capsulitis.  This is been going on for 3 weeks despite trialing anti inflammatories.  That effect I recommended ultrasound-guided injection of the glenohumeral joint.  On further visualization of the rotator cuff for repair does appear to be intact. Plan :    -Right ultrasound-guided glenohumeral  injection provided after verbal consent obtained    Procedure Note  Patient: Tammy Calderon             Date of Birth: [birth year]           MRN: [id number]             Visit Date: 02/27/2023  Procedures: Visit Diagnoses:  1. Pain in left foot     Large Joint Inj: R glenohumeral on 02/27/2023 1:49 PM Indications: pain Details: 22 G 1.5 in needle, ultrasound-guided anterior approach  Arthrogram: No  Medications: 4 mL lidocaine 1 %; 80 mg triamcinolone acetonide 40 MG/ML Outcome: tolerated well, no immediate complications Procedure, treatment alternatives, risks and benefits explained, specific risks discussed. Consent was given by the patient. Immediately prior to procedure  a time out was called to verify the correct patient, procedure, equipment, support staff and site/side marked as required. Patient was prepped and draped in the usual sterile fashion.           I personally saw and evaluated the patient, and participated in the management and treatment plan.  [name], MD Attending Physician, Orthopedic Surgery  This document was dictated using Dragon voice recognition software. A reasonable attempt at proof reading has been made to minimize errors.

## 2023-03-02 ENCOUNTER — Ambulatory Visit (HOSPITAL_COMMUNITY)
Admission: RE | Admit: 2023-03-02 | Discharge: 2023-03-02 | Disposition: A | Discharge status: Home / Self Care | Payer: Medicaid Other | Source: Ambulatory Visit | Attending: Gynecologic Oncology | Admitting: Gynecologic Oncology

## 2023-03-02 DIAGNOSIS — R109 Unspecified abdominal pain: Secondary | ICD-10-CM | POA: Insufficient documentation

## 2023-03-02 DIAGNOSIS — R58 Hemorrhage, not elsewhere classified: Secondary | ICD-10-CM | POA: Diagnosis not present

## 2023-03-02 DIAGNOSIS — C541 Malignant neoplasm of endometrium: Secondary | ICD-10-CM | POA: Insufficient documentation

## 2023-03-02 DIAGNOSIS — K8020 Calculus of gallbladder without cholecystitis without obstruction: Secondary | ICD-10-CM | POA: Diagnosis not present

## 2023-03-02 MED ORDER — IOHEXOL 350 MG/ML SOLN
75.0000 mL | Freq: Once | INTRAVENOUS | Status: AC | PRN
  Administered 2023-03-02: 75 mL via INTRAVENOUS

## 2023-03-04 ENCOUNTER — Telehealth: Payer: Self-pay

## 2023-03-04 NOTE — Telephone Encounter (Addendum)
Pt is aware of recent findings on CT report as per Frankey Poot NP note. She is not having anymore bleeding but still has a little pain on the left side.  She will keep the GI appointment and knows I will fax a copy of the report to her PCP Dr. Wolfgang Phoenix.

## 2023-03-04 NOTE — Telephone Encounter (Signed)
-----   Message from Dorothyann Gibbs, NP sent at 03/04/2023 10:07 AM EDT ----- Please let the patient know her CT has been read this am. They note:  -Gallstones with evidence of blockage or inflammation  -left sided diverticulosis with no findings of diverticulitis (inflammation of the pouches)  -no signs of cancer return or spread on CT  -they see some changes related to surgery  -they are seeing some thickened tissue at the lung bases. Any resp symptoms? This can be followed with PCP  HAS SHE HAD ANY MORE BLEEDING? HOW IS THE PAIN?

## 2023-03-05 ENCOUNTER — Ambulatory Visit: Payer: Medicaid Other | Admitting: Physical Medicine and Rehabilitation

## 2023-03-05 ENCOUNTER — Encounter: Payer: Self-pay | Admitting: Physical Medicine and Rehabilitation

## 2023-03-05 DIAGNOSIS — M7918 Myalgia, other site: Secondary | ICD-10-CM

## 2023-03-05 DIAGNOSIS — G8929 Other chronic pain: Secondary | ICD-10-CM

## 2023-03-05 DIAGNOSIS — M546 Pain in thoracic spine: Secondary | ICD-10-CM

## 2023-03-05 DIAGNOSIS — G894 Chronic pain syndrome: Secondary | ICD-10-CM

## 2023-03-05 DIAGNOSIS — M797 Fibromyalgia: Secondary | ICD-10-CM

## 2023-03-05 DIAGNOSIS — M549 Dorsalgia, unspecified: Secondary | ICD-10-CM

## 2023-03-05 NOTE — Progress Notes (Signed)
Tammy Calderon - 57 y.o. female MRN [id number]  Date of birth: 1966/10/18  Office Visit Note: Visit Date: 03/05/2023 PCP: Kathyrn Drown, MD Referred by: Kathyrn Drown, MD  Subjective: Chief Complaint  Patient presents with   Middle Back - Pain   HPI: Tammy Calderon is a 57 y.o. female who comes in today per the request of Dr. Sallee Lange for evaluation of chronic, worsening and severe bilateral upper back/shoulder pain radiating down entire back to lower lumbar region. Pain ongoing for several years, worsened after fall in 2023. Pain worsens with sitting, standing and walking, she describes her pain as aching sensation, currently rates as 8 out of 10. Some relief of pain with home exercise regimen, rest and use of medications. Thoracic MRI imaging from 2003 was unremarkable. History of formal physical therapy at [facility] [place], no relief of pain with these treatments. No history of dry needling. She does carry diagnosis of fibromyalgia, has tried Cymbalta, Gabapentin and Lyrica in the past, unable to tolerate due to drowsiness. States her pain is more intermittent, has good and bad days. Reports sedentary lifestyle and difficulty sleeping. Patient did inquire about pain management, she is interested in referral to chronic pain management practice. History of breast and endometrial cancer. Patient denies focal weakness, numbness and tingling. No recent trauma or falls.    Oswestry Disability Index Score 68% 30 to 40 (80%): very severe disability: Back pain impinges on all aspects of the patient's life. Positive intervention is required.  Review of Systems  Musculoskeletal:  Positive for back pain and myalgias.  Neurological:  Negative for tingling, sensory change, focal weakness and weakness.  All other systems reviewed and are negative.  Otherwise per HPI.  Assessment & Plan: Visit Diagnoses:    ICD-10-CM   1. Chronic upper back pain  M54.9 Ambulatory  referral to Pain Clinic   G89.29     2. Chronic bilateral thoracic back pain  M54.6 Ambulatory referral to Pain Clinic   G89.29     3. Myofascial pain syndrome  M79.18 Ambulatory referral to Pain Clinic    4. Fibromyalgia  M79.7 Ambulatory referral to Pain Clinic    5. Chronic pain syndrome  G89.4 Ambulatory referral to Pain Clinic       Plan: Findings:  Chronic, worsening and severe bilateral upper back/shoulder pain radiating down entire back to lower lumbar region. Patient continues to have pain despite good conservative therapies such as formal physical therapy, home exercise regimen, rest and use of medications. Patients clinical presentation and exam are complex, pain pattern is more non dermatomal. She did inquire about repeat thoracic MRI imaging. Her pain is diffuse tenderness/pain to bilateral shoulders and entire back. I do feel her pain is myofascial related and certainly exacerbated by her fibromyalgia. Exam is non focal, good strength noted to bilateral upper and lower extremities. At this point, we do not feel obtaining new thoracic MRI imaging would be beneficial. Of note, she has multiple intolerances to medications. She did inquire about chronic pain management. I explained to her that we do not manage chronic pain with long term opioids in our office. She would benefit from referral to chronic pain management practice, I did place referral to [facility] today. We are happy to see her back as needed. No red flag symptoms noted upon exam today.     Meds & Orders: No orders of the defined types were placed in this  encounter.   Orders Placed This Encounter  Procedures   Ambulatory referral to Pain Clinic    Follow-up: Return if symptoms worsen or fail to improve.   Procedures: No procedures performed      Clinical History: No specialty comments available.   She reports that she has never smoked. She has never used smokeless tobacco. No  results for input(s): "HGBA1C", "LABURIC" in the last 8760 hours.  Objective:  VS:  HT:    WT:   BMI:     BP:   HR: bpm  TEMP: ( )  RESP:  Physical Exam Vitals and nursing note reviewed.  HENT:     Head: Normocephalic and atraumatic.     Right Ear: External ear normal.     Left Ear: External ear normal.     Nose: Nose normal.     Mouth/Throat:     Mouth: Mucous membranes are moist.  Eyes:     Extraocular Movements: Extraocular movements intact.  Cardiovascular:     Rate and Rhythm: Normal rate.     Pulses: Normal pulses.  Pulmonary:     Effort: Pulmonary effort is normal.  Abdominal:     General: Abdomen is flat. There is no distension.  Musculoskeletal:        General: Tenderness present.     Cervical back: Normal range of motion.     Comments: Patient rises from seated position to standing without difficulty. Good lumbar range of motion. No pain noted with facet loading. 5/5 strength noted with bilateral hip flexion, knee flexion/extension, ankle dorsiflexion/plantarflexion and EHL. No clonus noted bilaterally. No pain upon palpation of greater trochanters. No pain with internal/external rotation of bilateral hips. Sensation intact bilaterally. Tenderness and multiple palpable trigger points to bilateral thoracic and lumbar paraspinal regions. Ambulates without aid, gait steady.     Skin:    General: Skin is warm and dry.     Capillary Refill: Capillary refill takes less than 2 seconds.  Neurological:     General: No focal deficit present.     Mental Status: She is alert and oriented to person, place, and time.  Psychiatric:        Mood and Affect: Mood normal.        Behavior: Behavior normal.     Ortho Exam  Imaging: No results found.  Past Medical/Family/Surgical/Social History: Medications & Allergies reviewed per EMR, new medications updated. 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   Flat epithelial atypia of breast, left    Atypical lobular hyperplasia of left breast 11/28/2021   History of UTI 11/07/2021   Gross hematuria 10/16/2021   Chronic back pain 12/19/2014   Edema 04/16/2014   Acute sinusitis 01/13/2014   Perimenopause 08/22/2013   Morbid obesity ([place]) 08/22/2013   Unspecified vitamin D deficiency 05/19/2013   Neuropathy 05/19/2013   Fibromyalgia 05/19/2013   Asthma with acute exacerbation 04/21/2013   Past Medical History:  Diagnosis Date   Anemia    past   Asthma    uses albuterol approx 1-2x a week   Bell's palsy    resolved   Breast cancer (H[facility])    Fibromyalgia    GERD (gastroesophageal reflux disease)  Hypertension    PONV (postoperative nausea and vomiting)    Family History  Adopted: Yes  Problem Relation Age of Onset   Breast cancer Mother    Breast cancer Sister    Past Surgical History:  Procedure Laterality Date   ABDOMINAL HYSTERECTOMY     BREAST BIOPSY WITH RADIO FREQUENCY LOCALIZER Left 12/27/2021   Procedure: EXCISIONAL BREAST BIOPSY WITH RADIO FREQUENCY LOCALIZER X2;  Surgeon: Virl Cagey, MD;  Location: [facility];  Service: General;  Laterality: Left;   LYMPH NODE DISSECTION N/A 03/19/2022   Procedure: LYMPH NODE DISSECTION;  Surgeon: [name], MD;  Location: [facility];  Service: Gynecology;  Laterality: N/A;   NASAL SEPTUM SURGERY     ROBOTIC ASSISTED TOTAL HYSTERECTOMY WITH BILATERAL SALPINGO OOPHERECTOMY Bilateral 03/19/2022   Procedure: XI ROBOTIC ASSISTED TOTAL HYSTERECTOMY WITH BILATERAL SALPINGO OOPHORECTOMY;  Surgeon: [name], MD;  Location: [facility];  Service: Gynecology;  Laterality: Bilateral;   SENTINEL NODE BIOPSY N/A 03/19/2022   Procedure: SENTINEL NODE BIOPSY;  Surgeon: [name], MD;  Location: [facility];  Service: Gynecology;  Laterality: N/A;   SHOULDER ARTHROSCOPY WITH ROTATOR CUFF REPAIR AND OPEN BICEPS TENODESIS Right 07/15/2022   Procedure: RIGHT SHOULDER ARTHROSCOPY WITH ROTATOR CUFF REPAIR AND OPEN BICEPS TENODESIS;  Surgeon: [name], MD;  Location: [facility];  Service: Orthopedics;  Laterality: Right;   TUBAL LIGATION     WISDOM TOOTH EXTRACTION     Social History   Occupational History   Not on file  Tobacco Use   Smoking status: Never   Smokeless tobacco: Never  Vaping Use   Vaping Use: Never used  Substance and Sexual Activity   Alcohol use: No   Drug use: Never   Sexual activity: Not Currently    Birth control/protection: Post-menopausal

## 2023-03-05 NOTE — Progress Notes (Signed)
Functional Pain Scale - descriptive words and definitions  Distressing (6)    Pain is present/unable to complete most ADLs limited by pain/sleep is difficult and active distraction is only marginal. Moderate range order  Average Pain 8  Upper and middle back pain that can radiate to the shoulders and ribs

## 2023-03-07 ENCOUNTER — Telehealth: Payer: Self-pay | Admitting: Family Medicine

## 2023-03-07 NOTE — Telephone Encounter (Signed)
Nurses need Please let the patient know that gynecology specialists did send [place] a copy of her CAT scan.  Her CAT scan did show gallstones but these do not appear to be causing any trouble.  This is not a uncommon finding for women in her age group.  If she is having severe right upper quadrant pain discomfort or vomiting to follow-up ASAP otherwise keep regular follow-up visit in June thank you

## 2023-03-09 ENCOUNTER — Emergency Department (HOSPITAL_COMMUNITY)
Admission: EM | Admit: 2023-03-09 | Discharge: 2023-03-09 | Disposition: A | Discharge status: Home / Self Care | Payer: Medicaid Other | Attending: Emergency Medicine | Admitting: Emergency Medicine

## 2023-03-09 ENCOUNTER — Encounter (HOSPITAL_COMMUNITY): Payer: Self-pay

## 2023-03-09 ENCOUNTER — Emergency Department (HOSPITAL_COMMUNITY): Payer: Medicaid Other

## 2023-03-09 ENCOUNTER — Ambulatory Visit: Payer: Medicaid Other | Admitting: Family Medicine

## 2023-03-09 ENCOUNTER — Ambulatory Visit (HOSPITAL_COMMUNITY)
Admission: RE | Admit: 2023-03-09 | Discharge: 2023-03-09 | Disposition: A | Discharge status: Home / Self Care | Payer: Medicaid Other | Source: Ambulatory Visit | Attending: Family Medicine | Admitting: Family Medicine

## 2023-03-09 ENCOUNTER — Other Ambulatory Visit: Payer: Self-pay

## 2023-03-09 VITALS — BP 139/87 | Ht 60.0 in | Wt 241.4 lb

## 2023-03-09 DIAGNOSIS — M545 Low back pain, unspecified: Secondary | ICD-10-CM | POA: Diagnosis not present

## 2023-03-09 DIAGNOSIS — R7989 Other specified abnormal findings of blood chemistry: Secondary | ICD-10-CM | POA: Insufficient documentation

## 2023-03-09 DIAGNOSIS — M546 Pain in thoracic spine: Secondary | ICD-10-CM | POA: Diagnosis not present

## 2023-03-09 DIAGNOSIS — R109 Unspecified abdominal pain: Secondary | ICD-10-CM | POA: Insufficient documentation

## 2023-03-09 DIAGNOSIS — K8020 Calculus of gallbladder without cholecystitis without obstruction: Secondary | ICD-10-CM | POA: Diagnosis not present

## 2023-03-09 DIAGNOSIS — Z853 Personal history of malignant neoplasm of breast: Secondary | ICD-10-CM | POA: Insufficient documentation

## 2023-03-09 DIAGNOSIS — D72829 Elevated white blood cell count, unspecified: Secondary | ICD-10-CM | POA: Diagnosis not present

## 2023-03-09 LAB — CBC WITH DIFFERENTIAL/PLATELET
Abs Immature Granulocytes: 0.19 10*3/uL — ABNORMAL HIGH (ref 0.00–0.07)
Basophils Absolute: 0 10*3/uL (ref 0.0–0.1)
Basophils Relative: 0 %
Eosinophils Absolute: 0 10*3/uL (ref 0.0–0.5)
Eosinophils Relative: 0 %
HCT: 37.5 % (ref 36.0–46.0)
Hemoglobin: 12.2 g/dL (ref 12.0–15.0)
Immature Granulocytes: 2 %
Lymphocytes Relative: 7 %
Lymphs Abs: 0.7 10*3/uL (ref 0.7–4.0)
MCH: 29.8 pg (ref 26.0–34.0)
MCHC: 32.5 g/dL (ref 30.0–36.0)
MCV: 91.5 fL (ref 80.0–100.0)
Monocytes Absolute: 0.6 10*3/uL (ref 0.1–1.0)
Monocytes Relative: 6 %
Neutro Abs: 9.2 10*3/uL — ABNORMAL HIGH (ref 1.7–7.7)
Neutrophils Relative %: 85 %
Platelets: 250 10*3/uL (ref 150–400)
RBC: 4.1 MIL/uL (ref 3.87–5.11)
RDW: 13.2 % (ref 11.5–15.5)
WBC: 10.8 10*3/uL — ABNORMAL HIGH (ref 4.0–10.5)
nRBC: 0 % (ref 0.0–0.2)

## 2023-03-09 LAB — COMPREHENSIVE METABOLIC PANEL
ALT: 18 U/L (ref 0–44)
AST: 18 U/L (ref 15–41)
Albumin: 3.5 g/dL (ref 3.5–5.0)
Alkaline Phosphatase: 60 U/L (ref 38–126)
Anion gap: 8 (ref 5–15)
BUN: 27 mg/dL — ABNORMAL HIGH (ref 6–20)
CO2: 28 mmol/L (ref 22–32)
Calcium: 8.9 mg/dL (ref 8.9–10.3)
Chloride: 98 mmol/L (ref 98–111)
Creatinine, Ser: 0.79 mg/dL (ref 0.44–1.00)
GFR, Estimated: >60 mL/min (ref >60)
Glucose, Bld: 99 mg/dL (ref 70–99)
Potassium: 4.2 mmol/L (ref 3.5–5.1)
Sodium: 134 mmol/L — ABNORMAL LOW (ref 135–145)
Total Bilirubin: 0.6 mg/dL (ref 0.3–1.2)
Total Protein: 6.8 g/dL (ref 6.5–8.1)

## 2023-03-09 LAB — URINALYSIS, ROUTINE W REFLEX MICROSCOPIC
Bilirubin Urine: NEGATIVE
Glucose, UA: NEGATIVE mg/dL
Hgb urine dipstick: NEGATIVE
Ketones, ur: NEGATIVE mg/dL
Nitrite: NEGATIVE
Protein, ur: NEGATIVE mg/dL
Specific Gravity, Urine: 1.023 (ref 1.005–1.030)
pH: 6 (ref 5.0–8.0)

## 2023-03-09 LAB — LIPASE, BLOOD: Lipase: 26 U/L (ref 11–51)

## 2023-03-09 MED ORDER — MORPHINE SULFATE (PF) 4 MG/ML IV SOLN
5.0000 mg | Freq: Once | INTRAVENOUS | Status: AC
  Administered 2023-03-09: 5 mg via INTRAVENOUS
  Filled 2023-03-09: qty 2

## 2023-03-09 MED ORDER — DICLOFENAC SODIUM 75 MG PO TBEC: 75.0000 mg | DELAYED_RELEASE_TABLET | Freq: Two times a day (BID) | ORAL | 2 refills | Status: DC

## 2023-03-09 MED ORDER — ONDANSETRON HCL 4 MG/2ML IJ SOLN
4.0000 mg | Freq: Once | INTRAMUSCULAR | Status: AC
  Administered 2023-03-09: 4 mg via INTRAVENOUS
  Filled 2023-03-09: qty 2

## 2023-03-09 MED ORDER — PREGABALIN 25 MG PO CAPS: 25.0000 mg | ORAL_CAPSULE | Freq: Two times a day (BID) | ORAL | 2 refills | Status: DC

## 2023-03-09 NOTE — Telephone Encounter (Signed)
Patient scheduled appointment 03/09/23 at 4:30 pm

## 2023-03-09 NOTE — ED Triage Notes (Signed)
Pt reports left side flank pain for several weeks, had a CT scan last week that only showed gall stones without cholecystitis and diverticulosis.

## 2023-03-09 NOTE — Progress Notes (Signed)
   Subjective:    Patient ID: Tammy Calderon, female    DOB: September 12, 1966, 57 y.o.   MRN: [id number]  HPI  Patient arrives with LLQ pain that wraps around her back for over a month- pain is getting progressively works and patient was seen in the ER this am and they could not find source of pain - was told she had gallstone but not sure if that is causing the pain  Patient with severe pain mainly in the lower back as well as left flank went to the ER CAT scan negative for stone She also has mid thoracic back pain that radiates into both shoulder blade regions.  In addition to this sometimes left flank pain radiates to the right side with some nausea and belching.  Previous CAT scan and this CAT scan showed gallstones  She is also concerned about the level of pain is made her quality of life severe she is unable to do much of anything without severe pain and finds herself feeling very debilitated Review of Systems     Objective:   Physical Exam  Lungs are clear respiratory rate normal heart is regular I do not find any evidence of shingles subjective discomfort on the left side abdomen and left flank region and lumbar and thoracic spine region    Assessment & Plan:  1. Left flank pain Patient with gallstones Unlikely to be causing left flank pain No sign of shingles Warning signs discussed Musculoskeletal pain consistent with fibromyalgia Will be seeing rheumatology in April We will retry Lyrica previous dosage of 50 mg taken 3 times daily cause drowsiness we will start off at 25 mg if it does cause drowsiness stop medicine.  Also give [place] feedback within the next week or 2 how this is doing may increase to 25 mg twice daily  - NM Hepato W/Eject Fract - US Abdomen Limited RUQ (LIVER/GB)  2. Thoracic spine pain Stop ibuprofen, may try Voltaren twice daily if that causes stomach discomfort stop medicine - DG Thoracic Spine W/Swimmers  3. Lumbar pain Please see above, orthopedics referring  her to pain management may or may not need an MRI - DG Lumbar Spine Complete  4. Gall stones We will do an ultrasound along with HIDA test because gallstones seen on CAT scan.  At the present moment does not need surgery - NM Hepato W/Eject Fract - US Abdomen Limited RUQ (LIVER/GB)

## 2023-03-09 NOTE — Discharge Instructions (Addendum)
Evaluation of your flank pain was overall reassuring.  There were no new findings on your CT scan today, did show a stable gallstone which is unchanged.  Recommend you follow-up with your PCP for ongoing flank pain.  If you start to develop blood in your urine, worsening flank pain, fever, nausea vomiting diarrhea or any other concerning symptom please return emergency department for further evaluation.

## 2023-03-09 NOTE — ED Notes (Signed)
ED PA at bedside

## 2023-03-09 NOTE — ED Provider Notes (Signed)
[facility] Provider Note   CSN: [id number] Arrival date & time: 03/09/23  1039     History  Chief Complaint  Patient presents with   Flank Pain   HPI Tammy Calderon is a 57 y.o. female with GERD, history of breast cancer, hysterectomy last year presenting for flank pain.  Started about 2 weeks ago.  Located in the left flank.  Feels sharp and at times radiates around the back to the right flank.  The pain was initially intermittent but has been more constant.  Denies hematuria, dysuria, and malodorous urine.  Also denies nausea vomiting diarrhea but states that she "feels bloated".  Last bowel movement was this morning and it was normal.  States she had a recent CT scan last week for vaginal bleeding which showed cholelithiasis but no acute intra-abdominal infection.   Flank Pain       Home Medications Prior to Admission medications   Medication Sig Start Date End Date Taking? Authorizing Provider  albuterol (VENTOLIN HFA) 108 (90 Base) MCG/ACT inhaler Inhale 2 puffs into the lungs every 6 (six) hours as needed for wheezing. 07/24/21   Vanessa Kick, MD  DULoxetine (CYMBALTA) 60 MG capsule Take 1 capsule (60 mg total) by mouth daily. 01/01/23   Kathyrn Drown, MD  famotidine (PEPCID) 40 MG tablet Take 1 tablet (40 mg total) by mouth daily. 08/06/22   Kathyrn Drown, MD  ibuprofen (ADVIL) 600 MG tablet Take 1 tablet (600 mg total) by mouth every 8 (eight) hours as needed. 08/21/22   Kathyrn Drown, MD  lisinopril-hydrochlorothiazide (ZESTORETIC) 20-12.5 MG tablet TAKE 1 TABLET BY MOUTH ONCE DAILY . APPOINTMENT REQUIRED FOR FUTURE REFILLS 12/04/22   Kathyrn Drown, MD      Allergies    Misc. sulfonamide containing compounds, Sulfa antibiotics, Cymbalta [duloxetine hcl], Lyrica [pregabalin], and Neurontin [gabapentin]    Review of Systems   Review of Systems  Genitourinary:  Positive for flank pain.    Physical Exam   Vitals:    03/09/23 1120  BP: (!) 119/48  Pulse: (!) 54  Resp: 20  Temp: 98.6 F (37 C)  SpO2: 100%    CONSTITUTIONAL:  well-appearing, NAD NEURO:  Alert and oriented x 3, CN 3-12 grossly intact EYES:  eyes equal and reactive ENT/NECK:  Supple, no stridor  CARDIO:  regular rate and rhythm, appears well-perfused  PULM:  No respiratory distress, CTAB GI/GU:  non-distended, soft, left mild cva tenderness MSK/SPINE:  No gross deformities, no edema, moves all extremities  SKIN:  no rash, atraumatic   *Additional and/or pertinent findings included in MDM below    ED Results / Procedures / Treatments   Labs (all labs ordered are listed, but only abnormal results are displayed) Labs Reviewed  CBC WITH DIFFERENTIAL/PLATELET - Abnormal; Notable for the following components:      Result Value   WBC 10.8 (*)    Neutro Abs 9.2 (*)    Abs Immature Granulocytes 0.19 (*)    All other components within normal limits  COMPREHENSIVE METABOLIC PANEL - Abnormal; Notable for the following components:   Sodium 134 (*)    BUN 27 (*)    All other components within normal limits  URINALYSIS, ROUTINE W REFLEX MICROSCOPIC - Abnormal; Notable for the following components:   Leukocytes,Ua SMALL (*)    Bacteria, UA RARE (*)    All other components within normal limits  LIPASE, BLOOD    EKG None  Radiology CT RENAL STONE STUDY  Result Date: 03/09/2023 CLINICAL DATA:  Left flank pain for the past 2 weeks. History of endometrial cancer. EXAM: CT ABDOMEN AND PELVIS WITHOUT CONTRAST TECHNIQUE: Multidetector CT imaging of the abdomen and pelvis was performed following the standard protocol without IV contrast. RADIATION DOSE REDUCTION: This exam was performed according to the departmental dose-optimization program which includes automated exposure control, adjustment of the mA and/or kV according to patient size and/or use of iterative reconstruction technique. COMPARISON:  CT abdomen pelvis dated March 02, 2023.  FINDINGS: Lower chest: No acute abnormality. Hepatobiliary: No focal liver abnormality. Unchanged gallstone. No gallbladder wall thickening or biliary dilatation. Pancreas: Unremarkable. No pancreatic ductal dilatation or surrounding inflammatory changes. Spleen: Normal in size without focal abnormality. Adrenals/Urinary Tract: Adrenal glands are unremarkable. Kidneys are normal, without renal calculi, focal lesion, or hydronephrosis. Bladder is decompressed. Stomach/Bowel: Stomach is within normal limits. Appendix appears normal. No evidence of bowel wall thickening, distention, or inflammatory changes. Mild left-sided colonic diverticulosis. Vascular/Lymphatic: No significant vascular findings are present. No enlarged abdominal or pelvic lymph nodes. Reproductive: Status post hysterectomy. No adnexal masses. Other: Mild hazy fat stranding in the central mesentery is unchanged dating back to November 2022. No free fluid or pneumoperitoneum. Musculoskeletal: No acute or significant osseous findings. IMPRESSION: 1. No acute intra-abdominal process. 2. Unchanged cholelithiasis. Electronically Signed   By: Titus Dubin M.D.   On: 03/09/2023 13:02    Procedures Procedures    Medications Ordered in ED Medications  morphine (PF) 4 MG/ML injection 5 mg (5 mg Intravenous Given 03/09/23 1249)  ondansetron (ZOFRAN) injection 4 mg (4 mg Intravenous Given 03/09/23 1249)    ED Course/ Medical Decision Making/ A&P                             Medical Decision Making Amount and/or Complexity of Data Reviewed Labs: ordered. Radiology: ordered.  Risk Prescription drug management.   Initial Impression and Ddx 57 year old female who is well-appearing presenting for flank pain.  Exam notable for left flank tenderness.  DDx includes nephrolithiasis, pyelonephritis, UTI, other intra-abdominal infection, and soft tissue injury. Patient PMH that increases complexity of ED encounter:  GERD, history of breast  cancer, hysterectomy  Interpretation of Diagnostics I independent reviewed and interpreted the labs as followed: Elevated BUN, mild leukocytosis  - I independently visualized the following imaging with scope of interpretation limited to determining acute life threatening conditions related to emergency care: CT abdomen pelvis, which revealed stable cholelithiasis with no acute findings  Patient Reassessment and Ultimate Disposition/Management Primary concern with possible nephrolithiasis versus pyelonephritis.  Fortunately, CT scan was unremarkable did reveal a stable cholelithiasis in comparison to last.  Treated her pain with morphine and Zofran.  Patient states she felt much better.  Advised to follow-up with her PCP for ongoing flank pain.  Discussed return precautions.  Discharged stable vitals.  Patient management required discussion with the following services or consulting groups:  None  Complexity of Problems Addressed Acute complicated illness or Injury  Additional Data Reviewed and Analyzed Further history obtained from: Past medical history and medications listed in the EMR, Prior ED visit notes, and Recent discharge summary  Patient Encounter Risk Assessment None         Final Clinical Impression(s) / ED Diagnoses Final diagnoses:  Flank pain    Rx / DC Orders ED Discharge Orders     None  Harriet Pho, PA-C 03/09/23 1423    Davonna Belling, MD 03/09/23 1526

## 2023-03-10 ENCOUNTER — Encounter: Payer: Self-pay | Admitting: Family Medicine

## 2023-03-10 DIAGNOSIS — N390 Urinary tract infection, site not specified: Secondary | ICD-10-CM

## 2023-03-10 MED ORDER — CEPHALEXIN 500 MG PO CAPS: 500.0000 mg | ORAL_CAPSULE | Freq: Three times a day (TID) | ORAL | 0 refills | Status: DC

## 2023-03-10 NOTE — Telephone Encounter (Signed)
Nurses-please relay the following to [facility] but also please make sure to send in the medication as discussed below. I reviewed over the labs BUN slightly elevated-basically means that at the time of the blood work her fluid intake may have been diminished.  Typically this will correct with adequate hydration  Sodium at 134 is inconsequential  White blood count slightly elevated urine did show some leukocytes I would recommend treating for possible UTI with Keflex 500 mg 1 taken 3 times daily for 5 days  Repeat CBC in approximately 10 to 14 days to make sure that the white blood count is coming back down to normal thank you  If any ongoing concerns questions or problems let us know

## 2023-03-11 NOTE — Telephone Encounter (Signed)
Nurses-ibuprofen was not helping enough.  We are trying diclofenac.  If this is not covered please see if there are other alternatives that are covered if not if it needs a prior approval lets move forward with this.  The reason for this is severe lumbar pain and flank pain

## 2023-03-16 ENCOUNTER — Ambulatory Visit (HOSPITAL_COMMUNITY): Payer: Medicaid Other

## 2023-03-16 NOTE — Telephone Encounter (Signed)
May try Mobic 15 mg, #30, 1 daily to see if this helps with her pain

## 2023-03-17 MED ORDER — MELOXICAM 15 MG PO TABS: 15.0000 mg | ORAL_TABLET | Freq: Every day | ORAL | 0 refills | Status: DC

## 2023-03-18 ENCOUNTER — Encounter (HOSPITAL_COMMUNITY): Payer: Self-pay | Admitting: Emergency Medicine

## 2023-03-18 ENCOUNTER — Emergency Department (HOSPITAL_COMMUNITY)
Admission: EM | Admit: 2023-03-18 | Discharge: 2023-03-18 | Disposition: A | Discharge status: Home / Self Care | Payer: Medicaid Other | Attending: Student | Admitting: Student

## 2023-03-18 ENCOUNTER — Emergency Department (HOSPITAL_COMMUNITY): Payer: Medicaid Other

## 2023-03-18 ENCOUNTER — Other Ambulatory Visit: Payer: Self-pay

## 2023-03-18 DIAGNOSIS — R0789 Other chest pain: Secondary | ICD-10-CM

## 2023-03-18 DIAGNOSIS — R2243 Localized swelling, mass and lump, lower limb, bilateral: Secondary | ICD-10-CM | POA: Diagnosis not present

## 2023-03-18 DIAGNOSIS — R002 Palpitations: Secondary | ICD-10-CM | POA: Insufficient documentation

## 2023-03-18 DIAGNOSIS — R079 Chest pain, unspecified: Secondary | ICD-10-CM | POA: Diagnosis not present

## 2023-03-18 DIAGNOSIS — R0602 Shortness of breath: Secondary | ICD-10-CM | POA: Diagnosis not present

## 2023-03-18 LAB — BASIC METABOLIC PANEL
Anion gap: 7 (ref 5–15)
BUN: 30 mg/dL — ABNORMAL HIGH (ref 6–20)
CO2: 26 mmol/L (ref 22–32)
Calcium: 8.3 mg/dL — ABNORMAL LOW (ref 8.9–10.3)
Chloride: 101 mmol/L (ref 98–111)
Creatinine, Ser: 0.78 mg/dL (ref 0.44–1.00)
GFR, Estimated: >60 mL/min (ref >60)
Glucose, Bld: 81 mg/dL (ref 70–99)
Potassium: 4.1 mmol/L (ref 3.5–5.1)
Sodium: 134 mmol/L — ABNORMAL LOW (ref 135–145)

## 2023-03-18 LAB — CBC
HCT: 33.8 % — ABNORMAL LOW (ref 36.0–46.0)
Hemoglobin: 11 g/dL — ABNORMAL LOW (ref 12.0–15.0)
MCH: 30.5 pg (ref 26.0–34.0)
MCHC: 32.5 g/dL (ref 30.0–36.0)
MCV: 93.6 fL (ref 80.0–100.0)
Platelets: 201 10*3/uL (ref 150–400)
RBC: 3.61 MIL/uL — ABNORMAL LOW (ref 3.87–5.11)
RDW: 14.4 % (ref 11.5–15.5)
WBC: 7.6 10*3/uL (ref 4.0–10.5)
nRBC: 0 % (ref 0.0–0.2)

## 2023-03-18 LAB — TROPONIN I (HIGH SENSITIVITY)
Troponin I (High Sensitivity): 31 ng/L — ABNORMAL HIGH (ref <18)
Troponin I (High Sensitivity): 34 ng/L — ABNORMAL HIGH (ref <18)

## 2023-03-18 LAB — D-DIMER, QUANTITATIVE: D-Dimer, Quant: <0.27 ug/mL-FEU (ref 0.00–0.50)

## 2023-03-18 LAB — BRAIN NATRIURETIC PEPTIDE: B Natriuretic Peptide: 100 pg/mL (ref 0.0–100.0)

## 2023-03-18 MED ORDER — LORAZEPAM 1 MG PO TABS
1.0000 mg | ORAL_TABLET | Freq: Once | ORAL | Status: AC
  Administered 2023-03-18: 1 mg via ORAL
  Filled 2023-03-18: qty 1

## 2023-03-18 NOTE — ED Provider Notes (Signed)
[facility] Provider Note   CSN: [id number] Arrival date & time: 03/18/23  1120     History  Chief Complaint  Patient presents with   Shortness of Breath   Chest Pain    Tammy Calderon is a 57 y.o. female.   Shortness of Breath Associated symptoms: chest pain   Associated symptoms: no abdominal pain, no cough, no fever, no headaches, no rash and no vomiting   Chest Pain Associated symptoms: palpitations and shortness of breath   Associated symptoms: no abdominal pain, no cough, no dysphagia, no fever, no headache, no nausea, no numbness, no vomiting and no weakness         Tammy Calderon is a 57 y.o. female with past medical history of hypertension, breast cancer, anemia, and fibromyalgia and asthma.  She presents to the Emergency Department complaining of central chest pain and shortness of breath that began this morning.  States pain woke her from sleep.  She describes feeling that her heart was beating fast for 1 week.  In the early morning hours, she woke from sleep and feeling that her heart was racing and she was having central, nonradiating chest pain.  She felt this morning that her face, feet, and lower legs were swelling and she is having pain radiating into her right arm.  No history of CHF.  Denies any new medications, recent travel.  Or history of DVTs or PEs.   Home Medications Prior to Admission medications   Medication Sig Start Date End Date Taking? Authorizing Provider  cephALEXin (KEFLEX) 500 MG capsule Take 1 capsule (500 mg total) by mouth 3 (three) times daily. 03/10/23   Kathyrn Drown, MD  albuterol (VENTOLIN HFA) 108 (90 Base) MCG/ACT inhaler Inhale 2 puffs into the lungs every 6 (six) hours as needed for wheezing. 07/24/21   Vanessa Kick, MD  diclofenac (VOLTAREN) 75 MG EC tablet Take 1 tablet (75 mg total) by mouth 2 (two) times daily. 03/09/23   Kathyrn Drown, MD  DULoxetine (CYMBALTA) 60 MG capsule Take 1  capsule (60 mg total) by mouth daily. 01/01/23   Kathyrn Drown, MD  famotidine (PEPCID) 40 MG tablet Take 1 tablet (40 mg total) by mouth daily. 08/06/22   Kathyrn Drown, MD  ibuprofen (ADVIL) 600 MG tablet Take 1 tablet (600 mg total) by mouth every 8 (eight) hours as needed. 08/21/22   Kathyrn Drown, MD  lisinopril-hydrochlorothiazide (ZESTORETIC) 20-12.5 MG tablet TAKE 1 TABLET BY MOUTH ONCE DAILY . APPOINTMENT REQUIRED FOR FUTURE REFILLS 12/04/22   Kathyrn Drown, MD  meloxicam (MOBIC) 15 MG tablet Take 1 tablet (15 mg total) by mouth daily. 03/17/23   Kathyrn Drown, MD  pregabalin (LYRICA) 25 MG capsule Take 1 capsule (25 mg total) by mouth 2 (two) times daily. 03/09/23   Kathyrn Drown, MD      Allergies    Misc. sulfonamide containing compounds, Sulfa antibiotics, Cymbalta [duloxetine hcl], Lyrica [pregabalin], and Neurontin [gabapentin]    Review of Systems   Review of Systems  Constitutional:  Negative for appetite change and fever.  HENT:  Negative for trouble swallowing.   Respiratory:  Positive for shortness of breath. Negative for cough.   Cardiovascular:  Positive for chest pain, palpitations and leg swelling.  Gastrointestinal:  Negative for abdominal pain, constipation, diarrhea, nausea and vomiting.  Genitourinary:  Negative for dysuria and flank pain.  Musculoskeletal:  Negative for arthralgias.  Skin:  Negative for color change and rash.  Neurological:  Negative for seizures, syncope, weakness, numbness and headaches.    Physical Exam Updated Vital Signs BP 131/73   Pulse 64   Temp 98.5 F (36.9 C) (Oral)   Resp 12   Ht 5\' 1"  (1.549 m)   Wt 108.9 kg   LMP 10/06/2011   SpO2 99%   BMI 45.35 kg/m  Physical Exam Vitals and nursing note reviewed.  Constitutional:      General: She is not in acute distress.    Appearance: She is not ill-appearing, toxic-appearing or diaphoretic.     Comments: Patient is anxious appearing  HENT:     Mouth/Throat:      Mouth: Mucous membranes are moist.     Pharynx: Oropharynx is clear.  Eyes:     Extraocular Movements: Extraocular movements intact.     Conjunctiva/sclera: Conjunctivae normal.     Pupils: Pupils are equal, round, and reactive to light.  Cardiovascular:     Rate and Rhythm: Normal rate and regular rhythm.     Pulses: Normal pulses.  Pulmonary:     Effort: Pulmonary effort is normal. No respiratory distress.     Breath sounds: Normal breath sounds. No wheezing.  Abdominal:     Palpations: Abdomen is soft.     Tenderness: There is no abdominal tenderness.  Musculoskeletal:     Right lower leg: No edema.     Left lower leg: No edema.  Skin:    General: Skin is warm.     Capillary Refill: Capillary refill takes less than 2 seconds.     Findings: No erythema or rash.  Neurological:     General: No focal deficit present.     Mental Status: She is alert.     Sensory: No sensory deficit.     Motor: No weakness.     ED Results / Procedures / Treatments   Labs (all labs ordered are listed, but only abnormal results are displayed) Labs Reviewed  BASIC METABOLIC PANEL - Abnormal; Notable for the following components:      Result Value   Sodium 134 (*)    BUN 30 (*)    Calcium 8.3 (*)    All other components within normal limits  CBC - Abnormal; Notable for the following components:   RBC 3.61 (*)    Hemoglobin 11.0 (*)    HCT 33.8 (*)    All other components within normal limits  TROPONIN I (HIGH SENSITIVITY) - Abnormal; Notable for the following components:   Troponin I (High Sensitivity) 31 (*)    All other components within normal limits  TROPONIN I (HIGH SENSITIVITY) - Abnormal; Notable for the following components:   Troponin I (High Sensitivity) 34 (*)    All other components within normal limits  D-DIMER, QUANTITATIVE  BRAIN NATRIURETIC PEPTIDE    EKG EKG Interpretation  Date/Time:  Wednesday March 18 2023 11:40:38 EDT Ventricular Rate:  67 PR  Interval:  116 QRS Duration: 78 QT Interval:  416 QTC Calculation: 440 R Axis:   48 Text Interpretation: Sinus rhythm Borderline short PR interval Confirmed by Kommor, Madison ([id number]) on 03/18/2023 1:01:10 PM  Radiology DG Chest Port 1 View  Result Date: 03/18/2023 CLINICAL DATA:  Chest pain and shortness of breath EXAM: PORTABLE CHEST 1 VIEW COMPARISON:  Chest radiograph 03/25/2021 FINDINGS: The heart is borderline enlarged. There is no focal consolidation or pulmonary edema. There is no pleural effusion or pneumothorax There is no acute osseous  abnormality. IMPRESSION: Borderline cardiomegaly. Otherwise, no radiographic evidence of acute cardiopulmonary process. Electronically Signed   By: Valetta Mole M.D.   On: 03/18/2023 12:15    Procedures Procedures    Medications Ordered in ED Medications - No data to display  ED Course/ Medical Decision Making/ A&P                             Medical Decision Making Patient here with central chest pain sudden onset early morning hours.  Feels that her heart is racing and she is having shortness of breath.  Notes feeling that her hearts been racing for 1 week.  No history of cardiac disease or arrhythmias.  Denies any new medications.  Differential would include but not limited to ACS, arrhythmia, PE, CHF, anxiety  Amount and/or Complexity of Data Reviewed Labs: ordered.    Details: Labs interpreted by me, no evidence of leukocytosis, hemoglobin 11, this is near baseline.  D-dimer unremarkable.  Chemistries BUN elevated at 30 this is also near baseline.  Serum creatinine unremarkable.  BNP is 100.  Initial troponin 31 delta troponin essentially flat at 34. Radiology: ordered.    Details: Chest x-ray without evidence of acute cardiopulmonary process and no evidence of pleural effusion. Discussion of management or test interpretation with external provider(s): On recheck, patient continues to complain of feeling tachycardic although her heart rate  has remained in the 50s to 60s.  She was given p.o. Ativan with some improvement of her symptoms.  She has a low heart score.  Discussed findings with attending physician.  Is felt that patient is appropriate for discharge home, given her symptoms and age, I feel that it is reasonable to arrange for ambulatory referral to cardiology.  Patient is agreeable to this plan.  She will also follow-up with PCP.  Return precautions discussed.   Risk Prescription drug management.   HEART score 3        Final Clinical Impression(s) / ED Diagnoses Final diagnoses:  Atypical chest pain    Rx / DC Orders ED Discharge Orders     None         Kem Parkinson, PA-C 03/18/23 1715    Kommor, Debe Coder, MD 03/18/23 2135

## 2023-03-18 NOTE — Discharge Instructions (Signed)
You have been given a referral to cardiology.  Someone from their office should be contacting you to arrange a follow-up appointment.  Also, please follow-up with your primary care provider for recheck.  Return emergency department for any new or worsening symptoms.

## 2023-03-18 NOTE — ED Triage Notes (Addendum)
Chest pain and SOB started this morning. Pt said yesterday felt like heart was racing yesterday and woke pt up from sleep. Chest pain is in center of chest does not radiate. Also complains of bilateral feet swelling that started yesterday.

## 2023-03-23 ENCOUNTER — Ambulatory Visit (HOSPITAL_COMMUNITY): Payer: Medicaid Other | Admitting: Psychiatry

## 2023-03-24 ENCOUNTER — Ambulatory Visit (HOSPITAL_COMMUNITY): Payer: Medicaid Other | Admitting: Psychiatry

## 2023-04-01 ENCOUNTER — Ambulatory Visit (HOSPITAL_COMMUNITY)
Admit: 2023-04-01 | Discharge: 2023-04-01 | Disposition: A | Discharge status: Home / Self Care | Payer: Medicaid Other | Attending: Family Medicine | Admitting: Family Medicine

## 2023-04-01 ENCOUNTER — Ambulatory Visit (HOSPITAL_COMMUNITY)
Admission: RE | Admit: 2023-04-01 | Discharge: 2023-04-01 | Disposition: A | Discharge status: Home / Self Care | Payer: Medicaid Other | Source: Ambulatory Visit | Attending: Family Medicine | Admitting: Family Medicine

## 2023-04-01 ENCOUNTER — Encounter (HOSPITAL_COMMUNITY): Payer: Self-pay

## 2023-04-01 DIAGNOSIS — K8020 Calculus of gallbladder without cholecystitis without obstruction: Secondary | ICD-10-CM | POA: Insufficient documentation

## 2023-04-01 DIAGNOSIS — R109 Unspecified abdominal pain: Secondary | ICD-10-CM | POA: Diagnosis not present

## 2023-04-01 DIAGNOSIS — K760 Fatty (change of) liver, not elsewhere classified: Secondary | ICD-10-CM | POA: Diagnosis not present

## 2023-04-01 MED ORDER — TECHNETIUM TC 99M MEBROFENIN IV KIT
5.0000 | PACK | Freq: Once | INTRAVENOUS | Status: AC | PRN
  Administered 2023-04-01: 5 via INTRAVENOUS

## 2023-04-02 ENCOUNTER — Encounter: Payer: Self-pay | Admitting: Family Medicine

## 2023-04-02 NOTE — Telephone Encounter (Signed)
Nurses Please see result note Go ahead with referral to Dr. Henreitta Leber for gallbladder dysfunction Please also send Tammy Calderon a message letting her know that this has been initiated thank you

## 2023-04-03 ENCOUNTER — Other Ambulatory Visit: Payer: Self-pay

## 2023-04-03 DIAGNOSIS — K8020 Calculus of gallbladder without cholecystitis without obstruction: Secondary | ICD-10-CM

## 2023-04-07 ENCOUNTER — Encounter: Payer: Medicaid Other | Admitting: Internal Medicine

## 2023-04-08 ENCOUNTER — Ambulatory Visit: Payer: Medicaid Other | Attending: Orthopaedic Surgery | Admitting: Internal Medicine

## 2023-04-08 ENCOUNTER — Encounter: Payer: Self-pay | Admitting: Internal Medicine

## 2023-04-08 VITALS — BP 118/80 | HR 62 | Resp 14 | Ht 60.75 in | Wt 248.0 lb

## 2023-04-08 DIAGNOSIS — M797 Fibromyalgia: Secondary | ICD-10-CM | POA: Insufficient documentation

## 2023-04-08 DIAGNOSIS — M25474 Effusion, right foot: Secondary | ICD-10-CM | POA: Diagnosis not present

## 2023-04-08 DIAGNOSIS — M25475 Effusion, left foot: Secondary | ICD-10-CM | POA: Diagnosis not present

## 2023-04-08 DIAGNOSIS — M25471 Effusion, right ankle: Secondary | ICD-10-CM | POA: Diagnosis not present

## 2023-04-08 DIAGNOSIS — R768 Other specified abnormal immunological findings in serum: Secondary | ICD-10-CM | POA: Insufficient documentation

## 2023-04-08 DIAGNOSIS — M25472 Effusion, left ankle: Secondary | ICD-10-CM | POA: Insufficient documentation

## 2023-04-08 NOTE — Progress Notes (Unsigned)
Office Visit Note  Patient: Tammy Calderon             Date of Birth: 1966/01/17           MRN: [id number]             PCP: Babs Sciara, MD Referring: Babs Sciara, MD Visit Date: 04/08/2023   Subjective:  New Patient (Initial Visit) (Patient states she recently had a lot of foot and ankle  swelling. Patient states her face gets red sometimes. )   History of Present Illness: Tammy Calderon is a 57 y.o. female here for evaluation of positive ANA and elevated sedimentation rate associated with chronic joint pain in multiple areas. She has fibromyalgia for years which has been partially improved with cymbalta and lyrica.***   Longstanding joint pains Previous rotator cuff repair surgery last year Bilateral shoulder pain with reaching overhead Lot of uppre back muscular soreness Facial rash small patches and comes and goes Also rash on upper chest, usually no itchy or pain Recent development bilateral foot and ankle swelling in just few months Very large, with some pain and erytema Has varicose veins and has fluid pill but normally does not require  Nonblanchoing spots Nail ridges Shoulder rom intact just sore  ***  Labs reviewed 01/2023 ANA pos ESR 30  11/2022 RF 10.7 ESR 40 CRP 0.6   Activities of Daily Living:  Patient reports morning stiffness for 1 hour.   Patient Reports nocturnal pain.  Difficulty dressing/grooming: Reports Difficulty climbing stairs: Reports Difficulty getting out of chair: Reports Difficulty using hands for taps, buttons, cutlery, and/or writing: Denies  Review of Systems  Constitutional:  Positive for fatigue.  HENT:  Positive for mouth dryness. Negative for mouth sores.   Eyes:  Positive for dryness.  Respiratory:  Positive for shortness of breath.   Cardiovascular:  Positive for palpitations. Negative for chest pain.  Gastrointestinal:  Positive for constipation and diarrhea. Negative for blood in stool.  Endocrine: Positive for  increased urination.  Genitourinary:  Positive for involuntary urination.  Musculoskeletal:  Positive for joint pain, gait problem, joint pain, joint swelling, myalgias, muscle weakness, morning stiffness, muscle tenderness and myalgias.  Skin:  Positive for hair loss and sensitivity to sunlight. Negative for color change and rash.  Allergic/Immunologic: Positive for susceptible to infections.  Neurological:  Negative for dizziness and headaches.  Hematological:  Negative for swollen glands.  Psychiatric/Behavioral:  Positive for depressed mood and sleep disturbance. The patient is nervous/anxious.     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   Flat epithelial atypia of breast, left    Atypical lobular hyperplasia of left breast 11/28/2021   History of UTI 11/07/2021   Gross hematuria 10/16/2021   Chronic back pain 12/19/2014   Edema 04/16/2014   Acute sinusitis 01/13/2014   Perimenopause 08/22/2013   Morbid obesity 08/22/2013   Unspecified vitamin D deficiency 05/19/2013   Neuropathy 05/19/2013   Fibromyalgia 05/19/2013   Asthma with acute exacerbation 04/21/2013    Past Medical History:  Diagnosis Date   Anemia    past   Asthma    uses albuterol approx 1-2x a week   Bell's palsy  resolved   Breast cancer    Fibromyalgia    GERD (gastroesophageal reflux disease)    Hypertension    PONV (postoperative nausea and vomiting)     Family History  Adopted: Yes  Problem Relation Age of Onset   Breast cancer Mother    Breast cancer Sister    Past Surgical History:  Procedure Laterality Date   ABDOMINAL HYSTERECTOMY     BREAST BIOPSY WITH RADIO FREQUENCY LOCALIZER Left 12/27/2021    Procedure: EXCISIONAL BREAST BIOPSY WITH RADIO FREQUENCY LOCALIZER X2;  Surgeon: [name], MD;  Location: [facility];  Service: General;  Laterality: Left;   LYMPH NODE DISSECTION N/A 03/19/2022   Procedure: LYMPH NODE DISSECTION;  Surgeon: Carver Fila, MD;  Location: [facility];  Service: Gynecology;  Laterality: N/A;   NASAL SEPTUM SURGERY     ROBOTIC ASSISTED TOTAL HYSTERECTOMY WITH BILATERAL SALPINGO OOPHERECTOMY Bilateral 03/19/2022   Procedure: XI ROBOTIC ASSISTED TOTAL HYSTERECTOMY WITH BILATERAL SALPINGO OOPHORECTOMY;  Surgeon: Carver Fila, MD;  Location: [facility];  Service: Gynecology;  Laterality: Bilateral;   SENTINEL NODE BIOPSY N/A 03/19/2022   Procedure: SENTINEL NODE BIOPSY;  Surgeon: Carver Fila, MD;  Location: [facility];  Service: Gynecology;  Laterality: N/A;   SHOULDER ARTHROSCOPY WITH ROTATOR CUFF REPAIR AND OPEN BICEPS TENODESIS Right 07/15/2022   Procedure: RIGHT SHOULDER ARTHROSCOPY WITH ROTATOR CUFF REPAIR AND OPEN BICEPS TENODESIS;  Surgeon: Huel Cote, MD;  Location: [facility];  Service: Orthopedics;  Laterality: Right;   TUBAL LIGATION     WISDOM TOOTH EXTRACTION     Social History   Social History Narrative   Not on file   Immunization History  Administered Date(s) Administered   Influenza,trivalent, recombinat, inj, PF 10/24/2015   Influenza-Unspecified 09/21/2014   PFIZER Comirnaty(Gray Top)Covid-19 Tri-Sucrose Vaccine 07/27/2020, 08/17/2020     Objective: Vital Signs: BP 118/80 (BP Location: Right Arm, Patient Position: Sitting, Cuff Size: Normal)   Pulse 62   Resp 14   Ht 5' 0.75" (1.543 m)   Wt 248 lb (112.5 kg)   LMP 10/06/2011   BMI 47.25 kg/m    Physical Exam   Musculoskeletal Exam: ***  CDAI Exam: CDAI Score: -- Patient Global: --; Provider Global: -- Swollen: --; Tender: -- Joint Exam 04/08/2023   No joint exam has been documented for this visit   There is currently no information documented  on the homunculus. Go to the Rheumatology activity and complete the homunculus joint exam.  Investigation: No additional findings.  Imaging: NM Hepato W/Eject Fract  Result Date: 04/01/2023 CLINICAL DATA:  Gallstones, flank pain. EXAM: NUCLEAR MEDICINE HEPATOBILIARY IMAGING WITH GALLBLADDER EF TECHNIQUE: Sequential images of the abdomen were obtained out to 60 minutes following intravenous administration of radiopharmaceutical. After oral ingestion of Ensure, gallbladder ejection fraction was determined. At 60 min, normal ejection fraction is greater than 33%. RADIOPHARMACEUTICALS:  5.0 mCi Tc-65m  Choletec IV COMPARISON:  Ultrasound April 01, 2023 FINDINGS: Prompt uptake and biliary excretion of activity by the liver is seen. Gallbladder activity is visualized, consistent with patency of cystic duct. Biliary activity passes into small bowel, consistent with patent common bile duct. Calculated gallbladder ejection fraction is 10%. (Normal gallbladder ejection fraction with Ensure is greater than 33%.) IMPRESSION: Reduced gallbladder ejection fraction as can be seen with chronic cholecystitis/biliary dyskinesia. Electronically Signed   By: Maudry Mayhew M.D.   On: 04/01/2023 16:54   US Abdomen Limited RUQ (LIVER/GB)  Result Date: 04/01/2023 CLINICAL DATA:  Gallstones and  flank pain. EXAM: ULTRASOUND ABDOMEN LIMITED RIGHT UPPER QUADRANT COMPARISON:  None Available. FINDINGS: Gallbladder: Gallstones are identified largest measures 2.8 cm. Gallbladder wall measures 2.4 mm. No sonographic Murphy sign noted by sonographer. Common bile duct: Diameter: 4.3 mm. Liver: No focal lesion identified. Diffuse increased echotexture. Portal vein is patent on color Doppler imaging with normal direction of blood flow towards the liver. Other: None. IMPRESSION: 1. Cholelithiasis without sonographic evidence of acute cholecystitis. 2. Fatty infiltration of liver. Electronically Signed   By: Sherian Rein M.D.   On:  04/01/2023 09:28   DG Chest Port 1 View  Result Date: 03/18/2023 CLINICAL DATA:  Chest pain and shortness of breath EXAM: PORTABLE CHEST 1 VIEW COMPARISON:  Chest radiograph 03/25/2021 FINDINGS: The heart is borderline enlarged. There is no focal consolidation or pulmonary edema. There is no pleural effusion or pneumothorax There is no acute osseous abnormality. IMPRESSION: Borderline cardiomegaly. Otherwise, no radiographic evidence of acute cardiopulmonary process. Electronically Signed   By: Lesia Hausen M.D.   On: 03/18/2023 12:15   DG Lumbar Spine Complete  Result Date: 03/10/2023 CLINICAL DATA:  Lumbar spine pain for 2 weeks. EXAM: LUMBAR SPINE - COMPLETE 4+ VIEW COMPARISON:  None Available. FINDINGS: There is no evidence of lumbar spine fracture. Minimal curvature of spine. Minimal narrow intervertebral space in the upper and lower lumbar spine. IMPRESSION: Minimal degenerative joint changes of lumbar spine. Electronically Signed   By: Sherian Rein M.D.   On: 03/10/2023 08:19   DG Thoracic Spine W/Swimmers  Result Date: 03/10/2023 CLINICAL DATA:  Thoracic spine pain for 2 weeks. EXAM: THORACIC SPINE - 3 VIEWS COMPARISON:  None Available. FINDINGS: There is no evidence of thoracic spine fracture. Scoliosis of spine. Minimal degenerative joint changes with minimal narrow joint space and minimal anterior spurring identified in thoracic spine. IMPRESSION: Minimal degenerative joint changes of thoracic spine. Electronically Signed   By: Sherian Rein M.D.   On: 03/10/2023 08:18   CT RENAL STONE STUDY  Result Date: 03/09/2023 CLINICAL DATA:  Left flank pain for the past 2 weeks. History of endometrial cancer. EXAM: CT ABDOMEN AND PELVIS WITHOUT CONTRAST TECHNIQUE: Multidetector CT imaging of the abdomen and pelvis was performed following the standard protocol without IV contrast. RADIATION DOSE REDUCTION: This exam was performed according to the departmental dose-optimization program which includes  automated exposure control, adjustment of the mA and/or kV according to patient size and/or use of iterative reconstruction technique. COMPARISON:  CT abdomen pelvis dated March 02, 2023. FINDINGS: Lower chest: No acute abnormality. Hepatobiliary: No focal liver abnormality. Unchanged gallstone. No gallbladder wall thickening or biliary dilatation. Pancreas: Unremarkable. No pancreatic ductal dilatation or surrounding inflammatory changes. Spleen: Normal in size without focal abnormality. Adrenals/Urinary Tract: Adrenal glands are unremarkable. Kidneys are normal, without renal calculi, focal lesion, or hydronephrosis. Bladder is decompressed. Stomach/Bowel: Stomach is within normal limits. Appendix appears normal. No evidence of bowel wall thickening, distention, or inflammatory changes. Mild left-sided colonic diverticulosis. Vascular/Lymphatic: No significant vascular findings are present. No enlarged abdominal or pelvic lymph nodes. Reproductive: Status post hysterectomy. No adnexal masses. Other: Mild hazy fat stranding in the central mesentery is unchanged dating back to November 2022. No free fluid or pneumoperitoneum. Musculoskeletal: No acute or significant osseous findings. IMPRESSION: 1. No acute intra-abdominal process. 2. Unchanged cholelithiasis. Electronically Signed   By: Obie Dredge M.D.   On: 03/09/2023 13:02    Recent Labs: Lab Results  Component Value Date   WBC 7.6 03/18/2023   HGB 11.0 (L)  03/18/2023   PLT 201 03/18/2023   NA 134 (L) 03/18/2023   K 4.1 03/18/2023   CL 101 03/18/2023   CO2 26 03/18/2023   GLUCOSE 81 03/18/2023   BUN 30 (H) 03/18/2023   CREATININE 0.78 03/18/2023   BILITOT 0.6 03/09/2023   ALKPHOS 60 03/09/2023   AST 18 03/09/2023   ALT 18 03/09/2023   PROT 6.8 03/09/2023   ALBUMIN 3.5 03/09/2023   CALCIUM 8.3 (L) 03/18/2023    Speciality Comments: No specialty comments available.  Procedures:  No procedures performed Allergies: Misc. sulfonamide  containing compounds, Sulfa antibiotics, Cymbalta [duloxetine hcl], Lyrica [pregabalin], and Neurontin [gabapentin]   Assessment / Plan:     Visit Diagnoses: No diagnosis found.  Orders: No orders of the defined types were placed in this encounter.  No orders of the defined types were placed in this encounter.   Face-to-face time spent with patient was *** minutes. Greater than 50% of time was spent in counseling and coordination of care.  Follow-Up Instructions: No follow-ups on file.   Fuller Plan, MD  Note - This record has been created using [employer].  Chart creation errors have been sought, but may not always  have been located. Such creation errors do not reflect on  the standard of medical care.

## 2023-04-09 LAB — RNP ANTIBODY: Ribonucleic Protein(ENA) Antibody, IgG: <1 AI

## 2023-04-09 LAB — C3 AND C4
C3 Complement: 187 mg/dL (ref 83–193)
C4 Complement: 44 mg/dL (ref 15–57)

## 2023-04-09 LAB — SEDIMENTATION RATE: Sed Rate: 36 mm/h — ABNORMAL HIGH (ref 0–30)

## 2023-04-09 LAB — SJOGRENS SYNDROME-A EXTRACTABLE NUCLEAR ANTIBODY: SSA (Ro) (ENA) Antibody, IgG: <1 AI

## 2023-04-09 LAB — ANTI-SMITH ANTIBODY: ENA SM Ab Ser-aCnc: <1 AI

## 2023-04-09 LAB — SJOGRENS SYNDROME-B EXTRACTABLE NUCLEAR ANTIBODY: SSB (La) (ENA) Antibody, IgG: <1 AI

## 2023-04-09 LAB — ANTI-DNA ANTIBODY, DOUBLE-STRANDED: ds DNA Ab: <1 IU/mL

## 2023-04-13 NOTE — Progress Notes (Signed)
The antibody test results were all negative which is good. Her sedimentation rate remains at 36 that is slightly above normal. I do not see evidence of an active autoimmune disease If her foot and leg swelling is causing a lot of trouble she could vascular clinic. Otherwise can continue the current treatment with compression, elevation, and as needed diuretics.

## 2023-04-21 ENCOUNTER — Ambulatory Visit: Payer: Medicaid Other | Admitting: General Surgery

## 2023-04-21 ENCOUNTER — Encounter: Payer: Self-pay | Admitting: General Surgery

## 2023-04-21 VITALS — BP 126/84 | HR 64 | Temp 98.0°F | Resp 14 | Ht 60.75 in | Wt 246.0 lb

## 2023-04-21 DIAGNOSIS — K8020 Calculus of gallbladder without cholecystitis without obstruction: Secondary | ICD-10-CM

## 2023-04-21 NOTE — H&P (Signed)
[facility] History and Physical  Reason for Referral: Gallstones  Referring Physician:  Dr. Gerda Diss   Chief Complaint   Gallstones - pain comes and goes     Tammy Calderon is a 57 y.o. female.  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 She has been having RUQ pain and some bloating indigestion for at least 2 months but it has been getting worse. She had her worse attack last week. She had an [place] that demonstrated stones and she is ready to get her gallbladder removed.  Her husband is scheduled for surgery on 5/9 and she wants to proceed with hers before he has his surgery.   Past Medical History:  Diagnosis Date   Anemia    past   Asthma    uses albuterol approx 1-2x a week   Bell's palsy    resolved   Breast cancer (HCC)    Fibromyalgia    GERD (gastroesophageal reflux disease)    Hypertension    PONV (postoperative nausea and vomiting)     Past Surgical History:  Procedure Laterality Date   ABDOMINAL HYSTERECTOMY     BREAST BIOPSY WITH RADIO FREQUENCY LOCALIZER Left 12/27/2021   Procedure: EXCISIONAL BREAST BIOPSY WITH RADIO FREQUENCY LOCALIZER X2;  Surgeon: [name], MD;  Location: [facility];  Service: General;  Laterality: Left;   LYMPH NODE DISSECTION N/A 03/19/2022   Procedure: LYMPH NODE DISSECTION;  Surgeon: Carver Fila, MD;  Location: [facility];  Service: Gynecology;  Laterality: N/A;   NASAL SEPTUM SURGERY     ROBOTIC ASSISTED TOTAL HYSTERECTOMY WITH BILATERAL SALPINGO OOPHERECTOMY Bilateral 03/19/2022   Procedure: XI ROBOTIC ASSISTED TOTAL HYSTERECTOMY WITH BILATERAL SALPINGO OOPHORECTOMY;  Surgeon: Carver Fila, MD;  Location: [facility];  Service: Gynecology;  Laterality: Bilateral;   SENTINEL NODE BIOPSY N/A 03/19/2022   Procedure: SENTINEL NODE BIOPSY;  Surgeon: Carver Fila, MD;  Location: [facility];  Service: Gynecology;  Laterality: N/A;   SHOULDER ARTHROSCOPY WITH ROTATOR CUFF REPAIR AND OPEN BICEPS  TENODESIS Right 07/15/2022   Procedure: RIGHT SHOULDER ARTHROSCOPY WITH ROTATOR CUFF REPAIR AND OPEN BICEPS TENODESIS;  Surgeon: Huel Cote, MD;  Location: [facility];  Service: Orthopedics;  Laterality: Right;   TUBAL LIGATION     WISDOM TOOTH EXTRACTION      Family History  Adopted: Yes  Problem Relation Age of Onset   Breast cancer Mother    Breast cancer Sister     Social History   Tobacco Use   Smoking status: Never    Passive exposure: Never   Smokeless tobacco: Never  Vaping Use   Vaping Use: Never used  Substance Use Topics   Alcohol use: No   Drug use: Never    Medications: I have reviewed the patient's current medications. Allergies as of 04/21/2023       Reactions   Misc. Sulfonamide Containing Compounds Anaphylaxis   Sulfa Antibiotics Anaphylaxis   Cymbalta [duloxetine Hcl]    sedated   Lyrica [pregabalin]    drowsy   Neurontin [gabapentin]    drowsy        Medication List        Accurate as of April 21, 2023 10:58 AM. If you have any questions, ask your nurse or doctor.          STOP taking these medications    cephALEXin 500 MG capsule Commonly known as: Keflex  TAKE these medications    albuterol 108 (90 Base) MCG/ACT inhaler Commonly known as: VENTOLIN HFA Inhale 2 puffs into the lungs every 6 (six) hours as needed for wheezing.   diclofenac 75 MG EC tablet Commonly known as: VOLTAREN Take 1 tablet (75 mg total) by mouth 2 (two) times daily.   DULoxetine 60 MG capsule Commonly known as: Cymbalta Take 1 capsule (60 mg total) by mouth daily.   ibuprofen 600 MG tablet Commonly known as: ADVIL Take 1 tablet (600 mg total) by mouth every 8 (eight) hours as needed.   lisinopril-hydrochlorothiazide 20-12.5 MG tablet Commonly known as: ZESTORETIC TAKE 1 TABLET BY MOUTH ONCE DAILY . APPOINTMENT REQUIRED FOR FUTURE REFILLS   meloxicam 15 MG tablet Commonly known as: MOBIC Take 1 tablet (15 mg total) by  mouth daily.   MULTI VITAMIN PO Take by mouth.   pregabalin 25 MG capsule Commonly known as: Lyrica Take 1 capsule (25 mg total) by mouth 2 (two) times daily. What changed: when to take this         ROS:  A comprehensive review of systems was negative except for: Respiratory: positive for wheezing and SOB Gastrointestinal: positive for abdominal pain and reflux symptoms Genitourinary: positive for frequency Musculoskeletal: positive for back pain and joint pain Endocrine: positive for temperature intolerance and tired/ sluggish  Blood pressure 126/84, pulse 64, temperature 98 F (36.7 C), temperature source Oral, resp. rate 14, height 5' 0.75" (1.543 m), weight 246 lb (111.6 kg), last menstrual period 10/06/2011, SpO2 98 %. Physical Exam Vitals reviewed.  HENT:     Head: Normocephalic.     Nose: Nose normal.  Eyes:     Extraocular Movements: Extraocular movements intact.  Cardiovascular:     Rate and Rhythm: Normal rate and regular rhythm.  Pulmonary:     Effort: Pulmonary effort is normal.     Breath sounds: Normal breath sounds.  Abdominal:     General: There is no distension.     Palpations: Abdomen is soft.     Tenderness: There is no abdominal tenderness.  Musculoskeletal:        General: Normal range of motion.  Skin:    General: Skin is warm.  Neurological:     General: No focal deficit present.     Mental Status: She is alert and oriented to person, place, and time.  Psychiatric:        Mood and Affect: Mood normal.        Behavior: Behavior normal.        Thought Content: Thought content normal.        Judgment: Judgment normal.     Results: personally reviewed- large stone  CLINICAL DATA:  Gallstones and flank pain.   EXAM: ULTRASOUND ABDOMEN LIMITED RIGHT UPPER QUADRANT   COMPARISON:  None Available.   FINDINGS: Gallbladder:   Gallstones are identified largest measures 2.8 cm. Gallbladder wall measures 2.4 mm. No sonographic Murphy sign  noted by sonographer.   Common bile duct:   Diameter: 4.3 mm.   Liver:   No focal lesion identified. Diffuse increased echotexture. Portal vein is patent on color Doppler imaging with normal direction of blood flow towards the liver.   Other: None.   IMPRESSION: 1. Cholelithiasis without sonographic evidence of acute cholecystitis. 2. Fatty infiltration of liver.     Electronically Signed   By: Sherian Rein M.D.   On: 04/01/2023 09:28  Assessment & Plan:  Tammy Calderon is a 57 y.o. female  with gallstones and symptoms.   PLAN: I counseled the patient about the indication, risks and benefits of robotic assisted laparoscopic cholecystectomy.  She understands there is a very small chance for bleeding, infection, injury to normal structures (including common bile duct), conversion to open surgery, persistent symptoms, evolution of postcholecystectomy diarrhea, need for secondary interventions, anesthesia reaction, cardiopulmonary issues and other risks not specifically detailed here. I described the expected recovery, the plan for follow-up and the restrictions during the recovery phase.  All questions were answered.  Discussed that given her history of surgery and recovery she should be good after 1 week for her husband's surgery but that some people do take 2 weeks to recover. Discussed that with the large stone that she will have the most pain where we removed the gallbladder.   All questions were answered to the satisfaction of the patient and family.    [name] 04/21/2023, 10:58 AM

## 2023-04-21 NOTE — Progress Notes (Signed)
[facility] History and Physical  Reason for Referral: Gallstones  Referring Physician:  Dr. Luking   Chief Complaint   Gallstones - pain comes and goes     Tammy Calderon is a 57 y.o. female.  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 She has been having RUQ pain and some bloating indigestion for at least 2 months but it has been getting worse. She had her worse attack last week. She had an US that demonstrated stones and she is ready to get her gallbladder removed.  Her husband is scheduled for surgery on 5/9 and she wants to proceed with hers before he has his surgery.   Past Medical History:  Diagnosis Date   Anemia    past   Asthma    uses albuterol approx 1-2x a week   Bell's palsy    resolved   Breast cancer (HCC)    Fibromyalgia    GERD (gastroesophageal reflux disease)    Hypertension    PONV (postoperative nausea and vomiting)     Past Surgical History:  Procedure Laterality Date   ABDOMINAL HYSTERECTOMY     BREAST BIOPSY WITH RADIO FREQUENCY LOCALIZER Left 12/27/2021   Procedure: EXCISIONAL BREAST BIOPSY WITH RADIO FREQUENCY LOCALIZER X2;  Surgeon: Egidio Lofgren C, MD;  Location: [facility];  Service: General;  Laterality: Left;   LYMPH NODE DISSECTION N/A 03/19/2022   Procedure: LYMPH NODE DISSECTION;  Surgeon: Tucker, Katherine R, MD;  Location: [facility];  Service: Gynecology;  Laterality: N/A;   NASAL SEPTUM SURGERY     ROBOTIC ASSISTED TOTAL HYSTERECTOMY WITH BILATERAL SALPINGO OOPHERECTOMY Bilateral 03/19/2022   Procedure: XI ROBOTIC ASSISTED TOTAL HYSTERECTOMY WITH BILATERAL SALPINGO OOPHORECTOMY;  Surgeon: Tucker, Katherine R, MD;  Location: [facility];  Service: Gynecology;  Laterality: Bilateral;   SENTINEL NODE BIOPSY N/A 03/19/2022   Procedure: SENTINEL NODE BIOPSY;  Surgeon: Tucker, Katherine R, MD;  Location: [facility];  Service: Gynecology;  Laterality: N/A;   SHOULDER ARTHROSCOPY WITH ROTATOR CUFF REPAIR AND OPEN BICEPS  TENODESIS Right 07/15/2022   Procedure: RIGHT SHOULDER ARTHROSCOPY WITH ROTATOR CUFF REPAIR AND OPEN BICEPS TENODESIS;  Surgeon: Bokshan, Steven, MD;  Location: [facility];  Service: Orthopedics;  Laterality: Right;   TUBAL LIGATION     WISDOM TOOTH EXTRACTION      Family History  Adopted: Yes  Problem Relation Age of Onset   Breast cancer Mother    Breast cancer Sister     Social History   Tobacco Use   Smoking status: Never    Passive exposure: Never   Smokeless tobacco: Never  Vaping Use   Vaping Use: Never used  Substance Use Topics   Alcohol use: No   Drug use: Never    Medications: I have reviewed the patient's current medications. Allergies as of 04/21/2023       Reactions   Misc. Sulfonamide Containing Compounds Anaphylaxis   Sulfa Antibiotics Anaphylaxis   Cymbalta [duloxetine Hcl]    sedated   Lyrica [pregabalin]    drowsy   Neurontin [gabapentin]    drowsy        Medication List        Accurate as of April 21, 2023 10:58 AM. If you have any questions, ask your nurse or doctor.          STOP taking these medications    cephALEXin 500 MG capsule Commonly known as: Keflex         TAKE these medications    albuterol 108 (90 Base) MCG/ACT inhaler Commonly known as: VENTOLIN HFA Inhale 2 puffs into the lungs every 6 (six) hours as needed for wheezing.   diclofenac 75 MG EC tablet Commonly known as: VOLTAREN Take 1 tablet (75 mg total) by mouth 2 (two) times daily.   DULoxetine 60 MG capsule Commonly known as: Cymbalta Take 1 capsule (60 mg total) by mouth daily.   ibuprofen 600 MG tablet Commonly known as: ADVIL Take 1 tablet (600 mg total) by mouth every 8 (eight) hours as needed.   lisinopril-hydrochlorothiazide 20-12.5 MG tablet Commonly known as: ZESTORETIC TAKE 1 TABLET BY MOUTH ONCE DAILY . APPOINTMENT REQUIRED FOR FUTURE REFILLS   meloxicam 15 MG tablet Commonly known as: MOBIC Take 1 tablet (15 mg total) by  mouth daily.   MULTI VITAMIN PO Take by mouth.   pregabalin 25 MG capsule Commonly known as: Lyrica Take 1 capsule (25 mg total) by mouth 2 (two) times daily. What changed: when to take this         ROS:  A comprehensive review of systems was negative except for: Respiratory: positive for wheezing and SOB Gastrointestinal: positive for abdominal pain and reflux symptoms Genitourinary: positive for frequency Musculoskeletal: positive for back pain and joint pain Endocrine: positive for temperature intolerance and tired/ sluggish  Blood pressure 126/84, pulse 64, temperature 98 F (36.7 C), temperature source Oral, resp. rate 14, height 5' 0.75" (1.543 m), weight 246 lb (111.6 kg), last menstrual period 10/06/2011, SpO2 98 %. Physical Exam Vitals reviewed.  HENT:     Head: Normocephalic.     Nose: Nose normal.  Eyes:     Extraocular Movements: Extraocular movements intact.  Cardiovascular:     Rate and Rhythm: Normal rate and regular rhythm.  Pulmonary:     Effort: Pulmonary effort is normal.     Breath sounds: Normal breath sounds.  Abdominal:     General: There is no distension.     Palpations: Abdomen is soft.     Tenderness: There is no abdominal tenderness.  Musculoskeletal:        General: Normal range of motion.  Skin:    General: Skin is warm.  Neurological:     General: No focal deficit present.     Mental Status: She is alert and oriented to person, place, and time.  Psychiatric:        Mood and Affect: Mood normal.        Behavior: Behavior normal.        Thought Content: Thought content normal.        Judgment: Judgment normal.     Results: personally reviewed- large stone  CLINICAL DATA:  Gallstones and flank pain.   EXAM: ULTRASOUND ABDOMEN LIMITED RIGHT UPPER QUADRANT   COMPARISON:  None Available.   FINDINGS: Gallbladder:   Gallstones are identified largest measures 2.8 cm. Gallbladder wall measures 2.4 mm. No sonographic Murphy sign  noted by sonographer.   Common bile duct:   Diameter: 4.3 mm.   Liver:   No focal lesion identified. Diffuse increased echotexture. Portal vein is patent on color Doppler imaging with normal direction of blood flow towards the liver.   Other: None.   IMPRESSION: 1. Cholelithiasis without sonographic evidence of acute cholecystitis. 2. Fatty infiltration of liver.     Electronically Signed   By: Wei-Chen  Lin M.D.   On: 04/01/2023 09:28  Assessment & Plan:  Tammy Calderon is a 57 y.o. female   with gallstones and symptoms.   PLAN: I counseled the patient about the indication, risks and benefits of robotic assisted laparoscopic cholecystectomy.  She understands there is a very small chance for bleeding, infection, injury to normal structures (including common bile duct), conversion to open surgery, persistent symptoms, evolution of postcholecystectomy diarrhea, need for secondary interventions, anesthesia reaction, cardiopulmonary issues and other risks not specifically detailed here. I described the expected recovery, the plan for follow-up and the restrictions during the recovery phase.  All questions were answered.  Discussed that given her history of surgery and recovery she should be good after 1 week for her husband's surgery but that some people do take 2 weeks to recover. Discussed that with the large stone that she will have the most pain where we removed the gallbladder.   All questions were answered to the satisfaction of the patient and family.    Merryl Buckels C Jax Abdelrahman 04/21/2023, 10:58 AM  

## 2023-04-22 ENCOUNTER — Encounter (HOSPITAL_COMMUNITY): Payer: Self-pay | Admitting: Anesthesiology

## 2023-04-22 ENCOUNTER — Other Ambulatory Visit: Payer: Self-pay

## 2023-04-22 ENCOUNTER — Encounter (HOSPITAL_COMMUNITY): Payer: Self-pay

## 2023-04-22 ENCOUNTER — Emergency Department (HOSPITAL_COMMUNITY)
Admission: EM | Admit: 2023-04-22 | Discharge: 2023-04-23 | Disposition: A | Discharge status: Home / Self Care | Payer: Medicaid Other | Attending: Emergency Medicine | Admitting: Emergency Medicine

## 2023-04-22 ENCOUNTER — Encounter (HOSPITAL_COMMUNITY)
Admission: RE | Admit: 2023-04-22 | Discharge: 2023-04-22 | Disposition: A | Discharge status: Home / Self Care | Payer: Medicaid Other | Source: Ambulatory Visit | Attending: General Surgery | Admitting: General Surgery

## 2023-04-22 DIAGNOSIS — K8020 Calculus of gallbladder without cholecystitis without obstruction: Secondary | ICD-10-CM

## 2023-04-22 DIAGNOSIS — R000 Tachycardia, unspecified: Secondary | ICD-10-CM | POA: Insufficient documentation

## 2023-04-22 DIAGNOSIS — I10 Essential (primary) hypertension: Secondary | ICD-10-CM | POA: Diagnosis not present

## 2023-04-22 DIAGNOSIS — Z79899 Other long term (current) drug therapy: Secondary | ICD-10-CM | POA: Insufficient documentation

## 2023-04-22 DIAGNOSIS — J45909 Unspecified asthma, uncomplicated: Secondary | ICD-10-CM | POA: Insufficient documentation

## 2023-04-22 DIAGNOSIS — Z853 Personal history of malignant neoplasm of breast: Secondary | ICD-10-CM | POA: Insufficient documentation

## 2023-04-22 DIAGNOSIS — Z01818 Encounter for other preprocedural examination: Secondary | ICD-10-CM | POA: Insufficient documentation

## 2023-04-22 DIAGNOSIS — R1011 Right upper quadrant pain: Secondary | ICD-10-CM | POA: Diagnosis present

## 2023-04-22 HISTORY — DX: Anxiety disorder, unspecified: F41.9

## 2023-04-22 HISTORY — DX: Depression, unspecified: F32.A

## 2023-04-22 LAB — COMPREHENSIVE METABOLIC PANEL
ALT: 29 U/L (ref 0–44)
AST: 25 U/L (ref 15–41)
Albumin: 3.4 g/dL — ABNORMAL LOW (ref 3.5–5.0)
Alkaline Phosphatase: 89 U/L (ref 38–126)
Anion gap: 9 (ref 5–15)
BUN: 14 mg/dL (ref 6–20)
CO2: 25 mmol/L (ref 22–32)
Calcium: 9 mg/dL (ref 8.9–10.3)
Chloride: 99 mmol/L (ref 98–111)
Creatinine, Ser: 0.86 mg/dL (ref 0.44–1.00)
GFR, Estimated: >60 mL/min (ref >60)
Glucose, Bld: 121 mg/dL — ABNORMAL HIGH (ref 70–99)
Potassium: 3.4 mmol/L — ABNORMAL LOW (ref 3.5–5.1)
Sodium: 133 mmol/L — ABNORMAL LOW (ref 135–145)
Total Bilirubin: 0.6 mg/dL (ref 0.3–1.2)
Total Protein: 6.6 g/dL (ref 6.5–8.1)

## 2023-04-22 LAB — CBC WITH DIFFERENTIAL/PLATELET
Abs Immature Granulocytes: 0.06 10*3/uL (ref 0.00–0.07)
Basophils Absolute: 0 10*3/uL (ref 0.0–0.1)
Basophils Relative: 1 %
Eosinophils Absolute: 0.1 10*3/uL (ref 0.0–0.5)
Eosinophils Relative: 1 %
HCT: 34.2 % — ABNORMAL LOW (ref 36.0–46.0)
Hemoglobin: 11.1 g/dL — ABNORMAL LOW (ref 12.0–15.0)
Immature Granulocytes: 1 %
Lymphocytes Relative: 8 %
Lymphs Abs: 0.5 10*3/uL — ABNORMAL LOW (ref 0.7–4.0)
MCH: 30.5 pg (ref 26.0–34.0)
MCHC: 32.5 g/dL (ref 30.0–36.0)
MCV: 94 fL (ref 80.0–100.0)
Monocytes Absolute: 0.6 10*3/uL (ref 0.1–1.0)
Monocytes Relative: 11 %
Neutro Abs: 4.4 10*3/uL (ref 1.7–7.7)
Neutrophils Relative %: 78 %
Platelets: 215 10*3/uL (ref 150–400)
RBC: 3.64 MIL/uL — ABNORMAL LOW (ref 3.87–5.11)
RDW: 14.7 % (ref 11.5–15.5)
WBC: 5.6 10*3/uL (ref 4.0–10.5)
nRBC: 0 % (ref 0.0–0.2)

## 2023-04-22 LAB — LIPASE, BLOOD: Lipase: 26 U/L (ref 11–51)

## 2023-04-22 MED ORDER — ONDANSETRON HCL 4 MG/2ML IJ SOLN
4.0000 mg | Freq: Once | INTRAMUSCULAR | Status: AC
  Administered 2023-04-22: 4 mg via INTRAVENOUS
  Filled 2023-04-22: qty 2

## 2023-04-22 MED ORDER — ONDANSETRON 4 MG PO TBDP: 4.0000 mg | ORAL_TABLET | Freq: Three times a day (TID) | ORAL | 0 refills | Status: DC | PRN

## 2023-04-22 MED ORDER — OXYCODONE-ACETAMINOPHEN 5-325 MG PO TABS: 1.0000 | ORAL_TABLET | Freq: Four times a day (QID) | ORAL | 0 refills | Status: DC | PRN

## 2023-04-22 MED ORDER — SODIUM CHLORIDE 0.9 % IV BOLUS
1000.0000 mL | Freq: Once | INTRAVENOUS | Status: AC
  Administered 2023-04-22: 1000 mL via INTRAVENOUS

## 2023-04-22 MED ORDER — KETOROLAC TROMETHAMINE 30 MG/ML IJ SOLN
30.0000 mg | Freq: Once | INTRAMUSCULAR | Status: AC
  Administered 2023-04-22: 30 mg via INTRAVENOUS
  Filled 2023-04-22: qty 1

## 2023-04-22 MED ORDER — MORPHINE SULFATE (PF) 4 MG/ML IV SOLN
4.0000 mg | Freq: Once | INTRAVENOUS | Status: AC
  Administered 2023-04-22: 4 mg via INTRAVENOUS
  Filled 2023-04-22: qty 1

## 2023-04-22 NOTE — ED Triage Notes (Signed)
Pt presents to ED with vomiting and abd pain, has been dx with gallstones and scheduled to have surgery here with Dr Henreitta Leber on Friday, pain and vomiting has increased over the past few days

## 2023-04-22 NOTE — ED Provider Notes (Signed)
11:29 PM Assumed care from Dr. Particia Nearing, please see their note for full history, physical and decision making until this point. In brief this is a 57 y.o. year old female who presented to the ED tonight with Cholelithiasis (Scheduled sx 04/24/23 w/ Dr Henreitta Leber)     Pending reeval for improvement in respiratory status 2/2 opiates causing mild hypoxia.   Hypoxia improved. Ambulated without hypoxia. Stable for d/c to fu w/ surgery as scheduled.   Discharge instructions, including strict return precautions for new or worsening symptoms, given. Patient and/or family verbalized understanding and agreement with the plan as described.   Labs, studies and imaging reviewed by myself and considered in medical decision making if ordered. Imaging interpreted by radiology.  Labs Reviewed  CBC WITH DIFFERENTIAL/PLATELET - Abnormal; Notable for the following components:      Result Value   RBC 3.64 (*)    Hemoglobin 11.1 (*)    HCT 34.2 (*)    Lymphs Abs 0.5 (*)    All other components within normal limits  COMPREHENSIVE METABOLIC PANEL - Abnormal; Notable for the following components:   Sodium 133 (*)    Potassium 3.4 (*)    Glucose, Bld 121 (*)    Albumin 3.4 (*)    All other components within normal limits  LIPASE, BLOOD  URINALYSIS, ROUTINE W REFLEX MICROSCOPIC    No orders to display    No follow-ups on file.    Vertie Dibbern, Barbara Cower, MD 04/24/23 [phone]

## 2023-04-22 NOTE — Patient Instructions (Signed)
Tammy Calderon  04/22/2023     @PREFPERIOPPHARMACY @   Your procedure is scheduled on 04/24/2023.  Report to [facility] at 1045 A.M.  Call this number if you have problems the morning of surgery:  [phone]  If you experience any cold or flu symptoms such as cough, fever, chills, shortness of breath, etc. between now and your scheduled surgery, please notify us at the above number.   Remember:   Do not eat or drink after midnight.     Take these medicines the morning of surgery with A SIP OF WATER : Cymbalta    Do not wear jewelry, make-up or nail polish.  Do not wear lotions, powders, or perfumes, or deodorant.  Do not shave 48 hours prior to surgery.  Men may shave face and neck.  Do not bring valuables to the hospital.  [facility] is not responsible for any belongings or valuables.  Contacts, dentures or bridgework may not be worn into surgery.  Leave your suitcase in the car.  After surgery it may be brought to your room.  For patients admitted to the hospital, discharge time will be determined by your treatment team.  Patients discharged the day of surgery will not be allowed to drive home.   Name and phone number of your driver:   family Special instructions:  N/A  Please read over the following fact sheets that you were given. Care and Recovery After Surgery  Minimally Invasive Cholecystectomy Minimally invasive cholecystectomy is surgery to remove the gallbladder. The gallbladder is a pear-shaped organ that lies beneath the liver on the right side of the body. The gallbladder stores bile, which is a fluid that helps the body digest fats. Cholecystectomy is often done to treat inflammation (irritation and swelling) of the gallbladder (cholecystitis). This condition is usually caused by a buildup of gallstones (cholelithiasis) in the gallbladder or when the fluid in the gall bladder becomes stagnant because gallstones get stuck in the ducts (tubes) and block the flow of  bile. This can result in inflammation and pain. In severe cases, emergency surgery may be required. This procedure is done through small incisions in the abdomen, instead of one large incision. It is also called laparoscopic surgery. A thin scope with a camera (laparoscope) is inserted through one incision. Then surgical instruments are inserted through the other incisions. In some cases, a minimally invasive surgery may need to be changed to a surgery that is done through a larger incision. This is called open surgery. Tell a health care provider about: Any allergies you have. All medicines you are taking, including vitamins, herbs, eye drops, creams, and over-the-counter medicines. Any problems you or family members have had with anesthetic medicines. Any bleeding problems you have. Any surgeries you have had. Any medical conditions you have. Whether you are pregnant or may be pregnant. What are the risks? Generally, this is a safe procedure. However, problems may occur, including: Infection. Bleeding. Allergic reactions to medicines. Damage to nearby structures or organs. A gallstone remaining in the common bile duct. The common bile duct carries bile from the gallbladder to the small intestine. A bile leak from the liver or cystic duct after your gallbladder is removed. What happens before the procedure? When to stop eating and drinking Follow instructions from your health care provider about what you may eat and drink before your procedure. These may include: 8 hours before the procedure Stop eating most foods. Do not eat meat, fried foods, or fatty  foods. Eat only light foods, such as toast or crackers. All liquids are okay except energy drinks and alcohol. 6 hours before the procedure Stop eating. Drink only clear liquids, such as water, clear fruit juice, black coffee, plain tea, and sports drinks. Do not drink energy drinks or alcohol. 2 hours before the procedure Stop drinking  all liquids. You may be allowed to take medicines with small sips of water. If you do not follow your health care provider's instructions, your procedure may be delayed or canceled. Medicines Ask your health care provider about: Changing or stopping your regular medicines. This is especially important if you are taking diabetes medicines or blood thinners. Taking medicines such as aspirin and ibuprofen. These medicines can thin your blood. Do not take these medicines unless your health care provider tells you to take them. Taking over-the-counter medicines, vitamins, herbs, and supplements. General instructions If you will be going home right after the procedure, plan to have a responsible adult: Take you home from the hospital or clinic. You will not be allowed to drive. Care for you for the time you are told. Do not use any products that contain nicotine or tobacco for at least 4 weeks before the procedure. These products include cigarettes, chewing tobacco, and vaping devices, such as e-cigarettes. If you need help quitting, ask your health care provider. Ask your health care provider: How your surgery site will be marked. What steps will be taken to help prevent infection. These may include: Removing hair at the surgery site. Washing skin with a germ-killing soap. Taking antibiotic medicine. What happens during the procedure?  An IV will be inserted into one of your veins. You will be given one or both of the following: A medicine to help you relax (sedative). A medicine to make you fall asleep (general anesthetic). Your surgeon will make several small incisions in your abdomen. The laparoscope will be inserted through one of the small incisions. The camera on the laparoscope will send images to a monitor in the operating room. This lets your surgeon see inside your abdomen. A gas will be pumped into your abdomen. This will expand your abdomen to give the surgeon more room to perform  the surgery. Other tools that are needed for the procedure will be inserted through the other incisions. The gallbladder will be removed through one of the incisions. Your common bile duct may be examined. If stones are found in the common bile duct, they may be removed. After your gallbladder has been removed, the incisions will be closed with stitches (sutures), staples, or skin glue. Your incisions will be covered with a bandage (dressing). The procedure may vary among health care providers and hospitals. What happens after the procedure? Your blood pressure, heart rate, breathing rate, and blood oxygen level will be monitored until you leave the hospital or clinic. You will be given medicines as needed to control your pain. You may have a drain placed in the incision. The drain will be removed a day or two after the procedure. Summary Minimally invasive cholecystectomy, also called laparoscopic cholecystectomy, is surgery to remove the gallbladder using small incisions. Tell your health care provider about all the medical conditions you have and all the medicines you are taking for those conditions. Before the procedure, follow instructions about when to stop eating and drinking and changing or stopping medicines. Plan to have a responsible adult care for you for the time you are told after you leave the hospital or clinic. This  information is not intended to replace advice given to you by your health care provider. Make sure you discuss any questions you have with your health care provider. Document Revised: 06/11/2021 Document Reviewed: 06/11/2021 Elsevier Patient Education  2023 [employer]  General Anesthesia, Adult General anesthesia is the use of medicine to make you fall asleep (unconscious) for a medical procedure. General anesthesia must be used for certain procedures. It is often recommended for surgery or procedures that: Last a long time. Require you to be still or in an  unusual position. Are major and can cause blood loss. Affect your breathing. The medicines used for general anesthesia are called general anesthetics. During general anesthesia, these medicines are given along with medicines that: Prevent pain. Control your blood pressure. Relax your muscles. Prevent nausea and vomiting after the procedure. Tell a health care provider about: Any allergies you have. All medicines you are taking, including vitamins, herbs, eye drops, creams, and over-the-counter medicines. Your history of any: Medical conditions you have, including: High blood pressure. Bleeding problems. Diabetes. Heart or lung conditions, such as: Heart failure. Sleep apnea. Asthma. Chronic obstructive pulmonary disease (COPD). Current or recent illnesses, such as: Upper respiratory, chest, or ear infections. Cough or fever. Tobacco or drug use, including marijuana or alcohol use. Depression or anxiety. Surgeries and types of anesthetics you have had. Problems you or family members have had with anesthetic medicines. Whether you are pregnant or may be pregnant. Whether you have any chipped or loose teeth, dentures, caps, bridgework, or issues with your mouth, swallowing, or choking. What are the risks? Your health care provider will talk with you about risks. These may include: Allergic reaction to the medicines. Lung and heart problems. Inhaling food or liquid from the stomach into the lungs (aspiration). Nerve injury. Injury to the lips, mouth, teeth, or gums. Stroke. Waking up during your procedure and being unable to move. This is rare. These problems are more likely to develop if you are having a major surgery or if you have an advanced or serious medical condition. You can prevent some of these complications by answering all of your health care provider's questions thoroughly and by following all instructions before your procedure. General anesthesia can cause side  effects, including: Nausea or vomiting. A sore throat or hoarseness from the breathing tube. Wheezing or coughing. Shaking chills or feeling cold. Body aches. Sleepiness. Confusion, agitation (delirium), or anxiety. What happens before the procedure? When to stop eating and drinking Follow instructions from your health care provider about what you may eat and drink before your procedure. If you do not follow your health care provider's instructions, your procedure may be delayed or canceled. Medicines Ask your health care provider about: Changing or stopping your regular medicines. These include any diabetes medicines or blood thinners you take. Taking medicines such as aspirin and ibuprofen. These medicines can thin your blood. Do not take them unless your health care provider tells you to. Taking over-the-counter medicines, vitamins, herbs, and supplements. General instructions Do not use any products that contain nicotine or tobacco for at least 4 weeks before the procedure. These products include cigarettes, chewing tobacco, and vaping devices, such as e-cigarettes. If you need help quitting, ask your health care provider. If you brush your teeth on the morning of the procedure, make sure to spit out all of the water and toothpaste. If told by your health care provider, bring your sleep apnea device with you to surgery (if applicable). If you will be  going home right after the procedure, plan to have a responsible adult: Take you home from the hospital or clinic. You will not be allowed to drive. Care for you for the time you are told. What happens during the procedure?  An IV will be inserted into one of your veins. You will be given one or more of the following through a face mask or IV: A sedative. This helps you relax. Anesthesia. This will: Numb certain areas of your body. Make you fall asleep for surgery. After you are unconscious, a breathing tube may be inserted down your  throat to help you breathe. This will be removed before you wake up. An anesthesia provider, such as an anesthesiologist, will stay with you throughout your procedure. The anesthesia provider will: Keep you comfortable and safe by continuing to give you medicines and adjusting the amount of medicine that you get. Monitor your blood pressure, heart rate, and oxygen levels to make sure that the anesthetics do not cause any problems. The procedure may vary among health care providers and hospitals. What happens after the procedure? Your blood pressure, temperature, heart rate, breathing rate, and blood oxygen level will be monitored until you leave the hospital or clinic. You will wake up in a recovery area. You may wake up slowly. You may be given medicine to help you with pain, nausea, or any other side effects from the anesthesia. Summary General anesthesia is the use of medicine to make you fall asleep (unconscious) for a medical procedure. Follow your health care provider's instructions about when to stop eating, drinking, or taking certain medicines before your procedure. Plan to have a responsible adult take you home from the hospital or clinic. This information is not intended to replace advice given to you by your health care provider. Make sure you discuss any questions you have with your health care provider. Document Revised: 03/06/2022 Document Reviewed: 03/06/2022 Elsevier Patient Education  2023 [employer]  How to Use Chlorhexidine Before Surgery Chlorhexidine gluconate (CHG) is a germ-killing (antiseptic) solution that is used to clean the skin. It can get rid of the bacteria that normally live on the skin and can keep them away for about 24 hours. To clean your skin with CHG, you may be given: A CHG solution to use in the shower or as part of a sponge bath. A prepackaged cloth that contains CHG. Cleaning your skin with CHG may help lower the risk for infection: While you are  staying in the intensive care unit of the hospital. If you have a vascular access, such as a central line, to provide short-term or long-term access to your veins. If you have a catheter to drain urine from your bladder. If you are on a ventilator. A ventilator is a machine that helps you breathe by moving air in and out of your lungs. After surgery. What are the risks? Risks of using CHG include: A skin reaction. Hearing loss, if CHG gets in your ears and you have a perforated eardrum. Eye injury, if CHG gets in your eyes and is not rinsed out. The CHG product catching fire. Make sure that you avoid smoking and flames after applying CHG to your skin. Do not use CHG: If you have a chlorhexidine allergy or have previously reacted to chlorhexidine. On babies younger than 67 months of age. How to use CHG solution Use CHG only as told by your health care provider, and follow the instructions on the label. Use the full amount  of CHG as directed. Usually, this is one bottle. During a shower Follow these steps when using CHG solution during a shower (unless your health care provider gives you different instructions): Start the shower. Use your normal soap and shampoo to wash your face and hair. Turn off the shower or move out of the shower stream. Pour the CHG onto a clean washcloth. Do not use any type of brush or rough-edged sponge. Starting at your neck, lather your body down to your toes. Make sure you follow these instructions: If you will be having surgery, pay special attention to the part of your body where you will be having surgery. Scrub this area for at least 1 minute. Do not use CHG on your head or face. If the solution gets into your ears or eyes, rinse them well with water. Avoid your genital area. Avoid any areas of skin that have broken skin, cuts, or scrapes. Scrub your back and under your arms. Make sure to wash skin folds. Let the lather sit on your skin for 1-2 minutes or  as long as told by your health care provider. Thoroughly rinse your entire body in the shower. Make sure that all body creases and crevices are rinsed well. Dry off with a clean towel. Do not put any substances on your body afterward--such as powder, lotion, or perfume--unless you are told to do so by your health care provider. Only use lotions that are recommended by the manufacturer. Put on clean clothes or pajamas. If it is the night before your surgery, sleep in clean sheets.  During a sponge bath Follow these steps when using CHG solution during a sponge bath (unless your health care provider gives you different instructions): Use your normal soap and shampoo to wash your face and hair. Pour the CHG onto a clean washcloth. Starting at your neck, lather your body down to your toes. Make sure you follow these instructions: If you will be having surgery, pay special attention to the part of your body where you will be having surgery. Scrub this area for at least 1 minute. Do not use CHG on your head or face. If the solution gets into your ears or eyes, rinse them well with water. Avoid your genital area. Avoid any areas of skin that have broken skin, cuts, or scrapes. Scrub your back and under your arms. Make sure to wash skin folds. Let the lather sit on your skin for 1-2 minutes or as long as told by your health care provider. Using a different clean, wet washcloth, thoroughly rinse your entire body. Make sure that all body creases and crevices are rinsed well. Dry off with a clean towel. Do not put any substances on your body afterward--such as powder, lotion, or perfume--unless you are told to do so by your health care provider. Only use lotions that are recommended by the manufacturer. Put on clean clothes or pajamas. If it is the night before your surgery, sleep in clean sheets. How to use CHG prepackaged cloths Only use CHG cloths as told by your health care provider, and follow the  instructions on the label. Use the CHG cloth on clean, dry skin. Do not use the CHG cloth on your head or face unless your health care provider tells you to. When washing with the CHG cloth: Avoid your genital area. Avoid any areas of skin that have broken skin, cuts, or scrapes. Before surgery Follow these steps when using a CHG cloth to clean before surgery (  unless your health care provider gives you different instructions): Using the CHG cloth, vigorously scrub the part of your body where you will be having surgery. Scrub using a back-and-forth motion for 3 minutes. The area on your body should be completely wet with CHG when you are done scrubbing. Do not rinse. Discard the cloth and let the area air-dry. Do not put any substances on the area afterward, such as powder, lotion, or perfume. Put on clean clothes or pajamas. If it is the night before your surgery, sleep in clean sheets.  For general bathing Follow these steps when using CHG cloths for general bathing (unless your health care provider gives you different instructions). Use a separate CHG cloth for each area of your body. Make sure you wash between any folds of skin and between your fingers and toes. Wash your body in the following order, switching to a new cloth after each step: The front of your neck, shoulders, and chest. Both of your arms, under your arms, and your hands. Your stomach and groin area, avoiding the genitals. Your right leg and foot. Your left leg and foot. The back of your neck, your back, and your buttocks. Do not rinse. Discard the cloth and let the area air-dry. Do not put any substances on your body afterward--such as powder, lotion, or perfume--unless you are told to do so by your health care provider. Only use lotions that are recommended by the manufacturer. Put on clean clothes or pajamas. Contact a health care provider if: Your skin gets irritated after scrubbing. You have questions about using  your solution or cloth. You swallow any chlorhexidine. Call your local poison control center ([phone] in the [place] Get help right away if: Your eyes itch badly, or they become very red or swollen. Your skin itches badly and is red or swollen. Your hearing changes. You have trouble seeing. You have swelling or tingling in your mouth or throat. You have trouble breathing. These symptoms may represent a serious problem that is an emergency. Do not wait to see if the symptoms will go away. Get medical help right away. Call your local emergency services (911 in the [place] Do not drive yourself to the hospital. Summary Chlorhexidine gluconate (CHG) is a germ-killing (antiseptic) solution that is used to clean the skin. Cleaning your skin with CHG may help to lower your risk for infection. You may be given CHG to use for bathing. It may be in a bottle or in a prepackaged cloth to use on your skin. Carefully follow your health care provider's instructions and the instructions on the product label. Do not use CHG if you have a chlorhexidine allergy. Contact your health care provider if your skin gets irritated after scrubbing. This information is not intended to replace advice given to you by your health care provider. Make sure you discuss any questions you have with your health care provider. Document Revised: 04/07/2022 Document Reviewed: 02/18/2021 Elsevier Patient Education  2023 [employer].

## 2023-04-22 NOTE — ED Provider Notes (Signed)
[facility] [place] EMERGENCY DEPARTMENT AT [facility] Provider Note   CSN: [id number] Arrival date & time: 04/22/23  2130     History  Chief Complaint  Patient presents with   Cholelithiasis    Scheduled sx 04/24/23 w/ Dr Henreitta Leber    Tammy Calderon is a 57 y.o. female.  Pt is a 57 yo female with pmhx significant for asthma, fibromyalgia, htn, breast cancer, gerd, anemia, anxiety, and depression.  Pt has known gallstones and has seen Dr. Henreitta Leber to get her gallbladder removed.  She's scheduled for surgery on 5/3.  Pt has been vomiting all day and she's having increasing pain in her RUQ.         Home Medications Prior to Admission medications   Medication Sig Start Date End Date Taking? Authorizing Provider  ondansetron (ZOFRAN-ODT) 4 MG disintegrating tablet Take 1 tablet (4 mg total) by mouth every 8 (eight) hours as needed. 04/22/23  Yes Jacalyn Lefevre, MD  oxyCODONE-acetaminophen (PERCOCET/ROXICET) 5-325 MG tablet Take 1 tablet by mouth every 6 (six) hours as needed for severe pain. 04/22/23  Yes Jacalyn Lefevre, MD  albuterol (VENTOLIN HFA) 108 (90 Base) MCG/ACT inhaler Inhale 2 puffs into the lungs every 6 (six) hours as needed for wheezing. 07/24/21   Mardella Layman, MD  diclofenac (VOLTAREN) 75 MG EC tablet Take 1 tablet (75 mg total) by mouth 2 (two) times daily. 03/09/23   Babs Sciara, MD  DULoxetine (CYMBALTA) 60 MG capsule Take 1 capsule (60 mg total) by mouth daily. 01/01/23   Babs Sciara, MD  ibuprofen (ADVIL) 600 MG tablet Take 1 tablet (600 mg total) by mouth every 8 (eight) hours as needed. 08/21/22   Babs Sciara, MD  lisinopril-hydrochlorothiazide (ZESTORETIC) 20-12.5 MG tablet TAKE 1 TABLET BY MOUTH ONCE DAILY . APPOINTMENT REQUIRED FOR FUTURE REFILLS 12/04/22   Babs Sciara, MD  meloxicam (MOBIC) 15 MG tablet Take 1 tablet (15 mg total) by mouth daily. Patient not taking: Reported on 03/18/2023 03/17/23   Babs Sciara, MD  Multiple Vitamin (MULTI VITAMIN PO)  Take by mouth.    [provider]  pregabalin (LYRICA) 25 MG capsule Take 1 capsule (25 mg total) by mouth 2 (two) times daily. Patient taking differently: Take 25 mg by mouth every evening. 03/09/23   Babs Sciara, MD      Allergies    Misc. sulfonamide containing compounds, Sulfa antibiotics, Cymbalta [duloxetine hcl], Lyrica [pregabalin], and Neurontin [gabapentin]    Review of Systems   Review of Systems  Gastrointestinal:  Positive for abdominal pain, nausea and vomiting.  All other systems reviewed and are negative.   Physical Exam Updated Vital Signs BP (!) 109/59   Pulse 85   Temp 99.1 F (37.3 C) (Oral)   Resp 17   Ht 5\' 1"  (1.549 m)   Wt 111.6 kg   LMP 10/06/2011   SpO2 97%   BMI 46.48 kg/m  Physical Exam Vitals and nursing note reviewed.  Constitutional:      Appearance: Normal appearance.  HENT:     Head: Normocephalic and atraumatic.     Right Ear: External ear normal.     Left Ear: External ear normal.     Nose: Nose normal.     Mouth/Throat:     Mouth: Mucous membranes are dry.     Pharynx: Oropharynx is clear.  Eyes:     Extraocular Movements: Extraocular movements intact.     Conjunctiva/sclera: Conjunctivae normal.  Pupils: Pupils are equal, round, and reactive to light.  Cardiovascular:     Rate and Rhythm: Regular rhythm. Tachycardia present.     Pulses: Normal pulses.     Heart sounds: Normal heart sounds.  Pulmonary:     Effort: Pulmonary effort is normal.     Breath sounds: Normal breath sounds.  Abdominal:     General: Abdomen is flat. Bowel sounds are normal.     Palpations: Abdomen is soft.     Tenderness: There is abdominal tenderness in the right upper quadrant.  Musculoskeletal:        General: Normal range of motion.     Cervical back: Normal range of motion and neck supple.  Skin:    General: Skin is warm.     Capillary Refill: Capillary refill takes less than 2 seconds.  Neurological:     General: No focal  deficit present.     Mental Status: She is alert and oriented to person, place, and time.  Psychiatric:        Mood and Affect: Mood normal.        Behavior: Behavior normal.     ED Results / Procedures / Treatments   Labs (all labs ordered are listed, but only abnormal results are displayed) Labs Reviewed  CBC WITH DIFFERENTIAL/PLATELET - Abnormal; Notable for the following components:      Result Value   RBC 3.64 (*)    Hemoglobin 11.1 (*)    HCT 34.2 (*)    Lymphs Abs 0.5 (*)    All other components within normal limits  COMPREHENSIVE METABOLIC PANEL - Abnormal; Notable for the following components:   Sodium 133 (*)    Potassium 3.4 (*)    Glucose, Bld 121 (*)    Albumin 3.4 (*)    All other components within normal limits  LIPASE, BLOOD  URINALYSIS, ROUTINE W REFLEX MICROSCOPIC    EKG None  Radiology No results found.  Procedures Procedures    Medications Ordered in ED Medications  ketorolac (TORADOL) 30 MG/ML injection 30 mg (has no administration in time range)  sodium chloride 0.9 % bolus 1,000 mL (0 mLs Intravenous Stopped 04/22/23 2300)  ondansetron (ZOFRAN) injection 4 mg (4 mg Intravenous Given 04/22/23 2225)  morphine (PF) 4 MG/ML injection 4 mg (4 mg Intravenous Given 04/22/23 2226)    ED Course/ Medical Decision Making/ A&P                             Medical Decision Making Amount and/or Complexity of Data Reviewed Labs: ordered.  Risk Prescription drug management.   This patient presents to the ED for concern of abd pain, this involves an extensive number of treatment options, and is a complaint that carries with it a high risk of complications and morbidity.  The differential diagnosis includes cholecystitis   Co morbidities that complicate the patient evaluation  asthma, fibromyalgia, htn, breast cancer, gerd, anemia, anxiety, and depression   Additional history obtained:  Additional history obtained from epic chart review External  records from outside source obtained and reviewed including husband   Lab Tests:  I Ordered, and personally interpreted labs.  The pertinent results include:  cbc without any changes, cmp nl   Cardiac Monitoring:  The patient was maintained on a cardiac monitor.  I personally viewed and interpreted the cardiac monitored which showed an underlying rhythm of: nsr   Medicines ordered and prescription drug management:  I ordered medication including morphine/zofran/ivfs  for sx  Reevaluation of the patient after these medicines showed that the patient improved I have reviewed the patients home medicines and have made adjustments as needed  Consultations Obtained:  I requested consultation with Dr. Robyne Peers (surgery),  and discussed lab and imaging findings as well as pertinent plan - she recommended pain control and d/c.  If pain severe, she could be admitted by the hospitalist.  No surgery until 5/3 when it's originally scheduled.  Problem List / ED Course:  Cholelithiasis:  pt's pain has improved. She is stable for d/c.  Return if worse.  F/u with surgery on 5/3 as planned.   Reevaluation:  After the interventions noted above, I reevaluated the patient and found that they have :improved   Social Determinants of Health:  Lives at home   Dispostion:  After consideration of the diagnostic results and the patients response to treatment, I feel that the patent would benefit from discharge with outpatient f/u.          Final Clinical Impression(s) / ED Diagnoses Final diagnoses:  Calculus of gallbladder without cholecystitis without obstruction    Rx / DC Orders ED Discharge Orders          Ordered    oxyCODONE-acetaminophen (PERCOCET/ROXICET) 5-325 MG tablet  Every 6 hours PRN        04/22/23 2323    ondansetron (ZOFRAN-ODT) 4 MG disintegrating tablet  Every 8 hours PRN        04/22/23 2323              Jacalyn Lefevre, MD 04/22/23 2325

## 2023-04-23 ENCOUNTER — Telehealth: Payer: Self-pay | Admitting: *Deleted

## 2023-04-23 NOTE — ED Notes (Signed)
Ambulated pt on room air. O2 sats prior to ambulation were 95%. Pt's O2 sats dropped to 93% during ambulation. Pt stated she was " a little SOB" when asked.

## 2023-04-23 NOTE — Telephone Encounter (Signed)
Surgical Date: 04/24/2023 Procedure: XI ROBOTIC ASSISTED LAPAROSCOPIC CHOLECYSTECTOMY  CPT: 47562 Dx: W09.81  Received call from patient [phone]~ telephone.   Patient reports that she has personal illness. Reports 04/22/2023 fever T-Max 101, nausea, RUQ abdominal pain, nasal congestion, ear pressure, and noted voice quality as hoarse.   Procedure to be cancelled and will reschedule once patient is improved.

## 2023-04-24 DIAGNOSIS — J069 Acute upper respiratory infection, unspecified: Secondary | ICD-10-CM | POA: Diagnosis not present

## 2023-04-24 DIAGNOSIS — R030 Elevated blood-pressure reading, without diagnosis of hypertension: Secondary | ICD-10-CM | POA: Diagnosis not present

## 2023-04-24 DIAGNOSIS — K8020 Calculus of gallbladder without cholecystitis without obstruction: Secondary | ICD-10-CM | POA: Diagnosis present

## 2023-04-24 DIAGNOSIS — Z6841 Body Mass Index (BMI) 40.0 and over, adult: Secondary | ICD-10-CM | POA: Diagnosis not present

## 2023-04-30 ENCOUNTER — Ambulatory Visit (HOSPITAL_COMMUNITY)
Admission: RE | Admit: 2023-04-30 | Discharge: 2023-04-30 | Disposition: A | Discharge status: Home / Self Care | Payer: Medicaid Other | Source: Ambulatory Visit | Attending: Family Medicine | Admitting: Family Medicine

## 2023-04-30 ENCOUNTER — Ambulatory Visit (INDEPENDENT_AMBULATORY_CARE_PROVIDER_SITE_OTHER): Payer: Medicaid Other | Admitting: Family Medicine

## 2023-04-30 VITALS — BP 132/86 | Temp 97.6°F | Ht 60.75 in | Wt 246.0 lb

## 2023-04-30 DIAGNOSIS — R059 Cough, unspecified: Secondary | ICD-10-CM | POA: Diagnosis not present

## 2023-04-30 DIAGNOSIS — R051 Acute cough: Secondary | ICD-10-CM

## 2023-04-30 DIAGNOSIS — J988 Other specified respiratory disorders: Secondary | ICD-10-CM | POA: Diagnosis not present

## 2023-04-30 MED ORDER — AMOXICILLIN-POT CLAVULANATE 875-125 MG PO TABS: 1.0000 | ORAL_TABLET | Freq: Two times a day (BID) | ORAL | 0 refills | Status: DC

## 2023-04-30 NOTE — Assessment & Plan Note (Signed)
Given persistent symptoms, chest x-ray obtained today.  X-ray independently reviewed by me.  Interpretation: No evidence of pneumonia.  No pleural effusion. Placing empirically on antibiotic therapy given persistent symptoms and lack of improvement.

## 2023-04-30 NOTE — Progress Notes (Signed)
Subjective:  Patient ID: Tammy Calderon, female    DOB: 05-08-1966  Age: 57 y.o. MRN: [id number]  CC: Chief Complaint  Patient presents with   Cough    Congestion and fever    HPI:  57 year old female presents for evaluation of the above.  Patient states that she has had ongoing respiratory symptoms since last Wednesday.  She has had sore throat, cough, and congestion.  Thick mucus.   Patient states that she has had low-grade temperatures as well, Tmax 100.1.  Patient states that she has been evaluated in the hospital on 5/1.  She states that she also went to local urgent care recently and had negative strep testing.  She continues to be symptomatic.  She is particular concerned given the fact that she is scheduled to have surgery next week.  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   History of UTI 11/07/2021   Chronic back pain 12/19/2014   Perimenopause 08/22/2013   Morbid obesity (HCC) 08/22/2013   Unspecified vitamin D deficiency 05/19/2013   Neuropathy 05/19/2013   Fibromyalgia 05/19/2013   Asthma with acute exacerbation 04/21/2013    Social Hx   Social History   Socioeconomic History   Marital status: Married    Spouse name: Not on file   Number of children: Not on file   Years of education: Not on file   Highest education level: Not on file  Occupational History   Not on file  Tobacco Use   Smoking status: Never    Passive exposure: Never   Smokeless tobacco: Never  Vaping Use   Vaping Use: Never used  Substance and Sexual Activity   Alcohol use:  No   Drug use: Never   Sexual activity: Not Currently    Birth control/protection: Post-menopausal  Other Topics Concern   Not on file  Social History Narrative   Not on file   Social Determinants of Health   Financial Resource Strain: Not on file  Food Insecurity: Not on file  Transportation Needs: Not on file  Physical Activity: Not on file  Stress: Not on file  Social Connections: Not on file    Review of Systems Per HPI  Objective:  BP 132/86   Temp 97.6 F (36.4 C) (Oral)   Ht 5' 0.75" (1.543 m)   Wt 246 lb (111.6 kg)   LMP 10/06/2011   BMI 46.86 kg/m      04/30/2023   10:53 AM 04/23/2023    2:00 AM 04/23/2023    1:30 AM  BP/Weight  Systolic BP 132 106 100  Diastolic BP 86 60 53  Wt. (Lbs) 246    BMI 46.86 kg/m2      Physical Exam Vitals and nursing note reviewed.  Constitutional:      General: She is not in acute distress.    Appearance: Normal appearance. She is obese.  HENT:     Head: Normocephalic and atraumatic.     Right Ear: Tympanic membrane normal.     Left Ear: Tympanic membrane normal.     Mouth/Throat:     Pharynx: Oropharynx  is clear.  Eyes:     General:        Right eye: No discharge.        Left eye: No discharge.     Conjunctiva/sclera: Conjunctivae normal.  Cardiovascular:     Rate and Rhythm: Normal rate and regular rhythm.  Pulmonary:     Effort: Pulmonary effort is normal.     Breath sounds: Normal breath sounds. No wheezing, rhonchi or rales.  Neurological:     Mental Status: She is alert.     Lab Results  Component Value Date   WBC 5.6 04/22/2023   HGB 11.1 (L) 04/22/2023   HCT 34.2 (L) 04/22/2023   PLT 215 04/22/2023   GLUCOSE 121 (H) 04/22/2023   CHOL 311 (H) 12/11/2022   TRIG 124 12/11/2022   HDL 59 12/11/2022   LDLCALC 227 (H) 12/11/2022   ALT 29 04/22/2023   AST 25 04/22/2023   NA 133 (L) 04/22/2023   K 3.4 (L) 04/22/2023   CL 99 04/22/2023   CREATININE 0.86 04/22/2023   BUN 14 04/22/2023   CO2 25  04/22/2023   TSH 2.384 04/13/2014     Assessment & Plan:   Problem List Items Addressed This Visit       Respiratory   Respiratory infection - Primary    Given persistent symptoms, chest x-ray obtained today.  X-ray independently reviewed by me.  Interpretation: No evidence of pneumonia.  No pleural effusion. Placing empirically on antibiotic therapy given persistent symptoms and lack of improvement.      Other Visit Diagnoses     Acute cough       Relevant Orders   DG Chest 2 View       Meds ordered this encounter  Medications   amoxicillin-clavulanate (AUGMENTIN) 875-125 MG tablet    Sig: Take 1 tablet by mouth 2 (two) times daily.    Dispense:  14 tablet    Refill:  0    Follow-up:  Return if symptoms worsen or fail to improve.  Everlene Other DO [facility]

## 2023-04-30 NOTE — Patient Instructions (Signed)
Chest xray today.  I will call with results.  Take care  Dr. Adriana Simas

## 2023-04-30 NOTE — Addendum Note (Signed)
Addended by: Tommie Sams on: 04/30/2023 02:11 PM   Modules accepted: Level of Service

## 2023-05-01 ENCOUNTER — Encounter (HOSPITAL_COMMUNITY)
Admission: RE | Admit: 2023-05-01 | Discharge: 2023-05-01 | Disposition: A | Discharge status: Home / Self Care | Payer: Medicaid Other | Source: Ambulatory Visit | Attending: General Surgery | Admitting: General Surgery

## 2023-05-04 DIAGNOSIS — K8020 Calculus of gallbladder without cholecystitis without obstruction: Secondary | ICD-10-CM | POA: Diagnosis present

## 2023-05-12 ENCOUNTER — Ambulatory Visit: Payer: Medicaid Other | Admitting: Internal Medicine

## 2023-05-13 ENCOUNTER — Telehealth: Payer: Self-pay

## 2023-05-13 MED ORDER — LISINOPRIL-HYDROCHLOROTHIAZIDE 20-12.5 MG PO TABS: ORAL_TABLET | ORAL | 5 refills | Status: DC

## 2023-05-13 NOTE — Telephone Encounter (Signed)
Prescription Request  05/13/2023  LOV: Visit date not found  What is the name of the medication or equipment? lisinopril-hydrochlorothiazide (ZESTORETIC) 20-12.5 MG tablet   Have you contacted your pharmacy to request a refill? Yes   Which pharmacy would you like this sent to?  [facility] [address] [place] - [address] [place] [address] Phone: [phone] Fax: [phone]    Patient notified that their request is being sent to the clinical staff for review and that they should receive a response within 2 business days.   Please advise at Mobile [phone] (mobile)

## 2023-05-20 ENCOUNTER — Encounter (HOSPITAL_COMMUNITY)
Admission: RE | Admit: 2023-05-20 | Discharge: 2023-05-20 | Disposition: A | Discharge status: Home / Self Care | Payer: Medicaid Other | Source: Ambulatory Visit | Attending: General Surgery | Admitting: General Surgery

## 2023-05-22 ENCOUNTER — Ambulatory Visit (HOSPITAL_COMMUNITY): Payer: Medicaid Other | Admitting: Certified Registered"

## 2023-05-22 ENCOUNTER — Other Ambulatory Visit: Payer: Self-pay

## 2023-05-22 ENCOUNTER — Encounter (HOSPITAL_COMMUNITY): Payer: Self-pay | Admitting: General Surgery

## 2023-05-22 ENCOUNTER — Ambulatory Visit (HOSPITAL_BASED_OUTPATIENT_CLINIC_OR_DEPARTMENT_OTHER): Payer: Medicaid Other | Admitting: Certified Registered"

## 2023-05-22 ENCOUNTER — Encounter (HOSPITAL_COMMUNITY)
Admission: RE | Disposition: A | Discharge status: Home / Self Care | Payer: Self-pay | Source: Home / Self Care | Attending: General Surgery

## 2023-05-22 ENCOUNTER — Ambulatory Visit (HOSPITAL_COMMUNITY)
Admission: RE | Admit: 2023-05-22 | Discharge: 2023-05-22 | Disposition: A | Discharge status: Home / Self Care | Payer: Medicaid Other | Attending: General Surgery | Admitting: General Surgery

## 2023-05-22 DIAGNOSIS — K760 Fatty (change of) liver, not elsewhere classified: Secondary | ICD-10-CM | POA: Insufficient documentation

## 2023-05-22 DIAGNOSIS — F32A Depression, unspecified: Secondary | ICD-10-CM | POA: Insufficient documentation

## 2023-05-22 DIAGNOSIS — K8010 Calculus of gallbladder with chronic cholecystitis without obstruction: Secondary | ICD-10-CM | POA: Insufficient documentation

## 2023-05-22 DIAGNOSIS — K8020 Calculus of gallbladder without cholecystitis without obstruction: Secondary | ICD-10-CM | POA: Diagnosis not present

## 2023-05-22 DIAGNOSIS — K219 Gastro-esophageal reflux disease without esophagitis: Secondary | ICD-10-CM | POA: Diagnosis not present

## 2023-05-22 DIAGNOSIS — F418 Other specified anxiety disorders: Secondary | ICD-10-CM

## 2023-05-22 DIAGNOSIS — I10 Essential (primary) hypertension: Secondary | ICD-10-CM | POA: Insufficient documentation

## 2023-05-22 DIAGNOSIS — J45909 Unspecified asthma, uncomplicated: Secondary | ICD-10-CM | POA: Diagnosis not present

## 2023-05-22 DIAGNOSIS — D649 Anemia, unspecified: Secondary | ICD-10-CM | POA: Insufficient documentation

## 2023-05-22 DIAGNOSIS — F419 Anxiety disorder, unspecified: Secondary | ICD-10-CM | POA: Insufficient documentation

## 2023-05-22 DIAGNOSIS — Z853 Personal history of malignant neoplasm of breast: Secondary | ICD-10-CM | POA: Diagnosis not present

## 2023-05-22 DIAGNOSIS — K812 Acute cholecystitis with chronic cholecystitis: Secondary | ICD-10-CM | POA: Diagnosis not present

## 2023-05-22 DIAGNOSIS — K828 Other specified diseases of gallbladder: Secondary | ICD-10-CM | POA: Diagnosis not present

## 2023-05-22 SURGERY — CHOLECYSTECTOMY, ROBOT-ASSISTED, LAPAROSCOPIC
Anesthesia: General | Site: Abdomen

## 2023-05-22 MED ORDER — ONDANSETRON HCL 4 MG/2ML IJ SOLN
INTRAMUSCULAR | Status: DC | PRN
  Administered 2023-05-22: 4 mg via INTRAVENOUS

## 2023-05-22 MED ORDER — OXYCODONE HCL 5 MG PO TABS: 5.0000 mg | ORAL_TABLET | ORAL | 0 refills | Status: DC | PRN

## 2023-05-22 MED ORDER — OXYCODONE HCL 5 MG PO TABS
5.0000 mg | ORAL_TABLET | Freq: Once | ORAL | Status: AC | PRN
  Administered 2023-05-22: 5 mg via ORAL
  Filled 2023-05-22: qty 1

## 2023-05-22 MED ORDER — CHLORHEXIDINE GLUCONATE CLOTH 2 % EX PADS: 6.0000 | MEDICATED_PAD | Freq: Once | CUTANEOUS | Status: DC

## 2023-05-22 MED ORDER — EPHEDRINE 5 MG/ML INJ
INTRAVENOUS | Status: AC
  Filled 2023-05-22: qty 5

## 2023-05-22 MED ORDER — CHLORHEXIDINE GLUCONATE 0.12 % MT SOLN
15.0000 mL | Freq: Once | OROMUCOSAL | Status: AC
  Administered 2023-05-22: 15 mL via OROMUCOSAL

## 2023-05-22 MED ORDER — PROPOFOL 500 MG/50ML IV EMUL
INTRAVENOUS | Status: DC | PRN
  Administered 2023-05-22: 25 ug/kg/min via INTRAVENOUS

## 2023-05-22 MED ORDER — ROCURONIUM BROMIDE 10 MG/ML (PF) SYRINGE
PREFILLED_SYRINGE | INTRAVENOUS | Status: AC
  Filled 2023-05-22: qty 10

## 2023-05-22 MED ORDER — PROPOFOL 10 MG/ML IV BOLUS
INTRAVENOUS | Status: AC
  Filled 2023-05-22: qty 20

## 2023-05-22 MED ORDER — LIDOCAINE HCL (PF) 2 % IJ SOLN
INTRAMUSCULAR | Status: AC
  Filled 2023-05-22: qty 5

## 2023-05-22 MED ORDER — PROPOFOL 500 MG/50ML IV EMUL
INTRAVENOUS | Status: AC
  Filled 2023-05-22: qty 50

## 2023-05-22 MED ORDER — DEXAMETHASONE SODIUM PHOSPHATE 4 MG/ML IJ SOLN
INTRAMUSCULAR | Status: DC | PRN
  Administered 2023-05-22: 5 mg via INTRAVENOUS

## 2023-05-22 MED ORDER — PHENYLEPHRINE 80 MCG/ML (10ML) SYRINGE FOR IV PUSH (FOR BLOOD PRESSURE SUPPORT)
PREFILLED_SYRINGE | INTRAVENOUS | Status: DC | PRN
  Administered 2023-05-22 (×2): 160 ug via INTRAVENOUS

## 2023-05-22 MED ORDER — PROPOFOL 10 MG/ML IV BOLUS
INTRAVENOUS | Status: DC | PRN
  Administered 2023-05-22: 150 mg via INTRAVENOUS

## 2023-05-22 MED ORDER — FENTANYL CITRATE PF 50 MCG/ML IJ SOSY
25.0000 ug | PREFILLED_SYRINGE | INTRAMUSCULAR | Status: DC | PRN
  Administered 2023-05-22 (×2): 50 ug via INTRAVENOUS
  Filled 2023-05-22 (×2): qty 1

## 2023-05-22 MED ORDER — SODIUM CHLORIDE 0.9 % IV SOLN
2.0000 g | INTRAVENOUS | Status: AC
  Administered 2023-05-22: 2 g via INTRAVENOUS

## 2023-05-22 MED ORDER — LACTATED RINGERS IV SOLN: INTRAVENOUS | Status: DC

## 2023-05-22 MED ORDER — EPHEDRINE SULFATE-NACL 50-0.9 MG/10ML-% IV SOSY
PREFILLED_SYRINGE | INTRAVENOUS | Status: DC | PRN
  Administered 2023-05-22: 10 mg via INTRAVENOUS

## 2023-05-22 MED ORDER — SEVOFLURANE IN SOLN
RESPIRATORY_TRACT | Status: AC
  Filled 2023-05-22: qty 250

## 2023-05-22 MED ORDER — SODIUM CHLORIDE 0.9 % IV SOLN
INTRAVENOUS | Status: AC
  Filled 2023-05-22: qty 2

## 2023-05-22 MED ORDER — OXYCODONE HCL 5 MG/5ML PO SOLN: 5.0000 mg | Freq: Once | ORAL | Status: AC | PRN

## 2023-05-22 MED ORDER — MIDAZOLAM HCL 2 MG/2ML IJ SOLN
INTRAMUSCULAR | Status: AC
  Filled 2023-05-22: qty 2

## 2023-05-22 MED ORDER — FENTANYL CITRATE (PF) 250 MCG/5ML IJ SOLN
INTRAMUSCULAR | Status: AC
  Filled 2023-05-22: qty 5

## 2023-05-22 MED ORDER — ONDANSETRON HCL 4 MG/2ML IJ SOLN
INTRAMUSCULAR | Status: AC
  Filled 2023-05-22: qty 2

## 2023-05-22 MED ORDER — MIDAZOLAM HCL 2 MG/2ML IJ SOLN
INTRAMUSCULAR | Status: DC | PRN
  Administered 2023-05-22: 2 mg via INTRAVENOUS

## 2023-05-22 MED ORDER — PHENYLEPHRINE HCL-NACL 20-0.9 MG/250ML-% IV SOLN
INTRAVENOUS | Status: DC | PRN
  Administered 2023-05-22: 30 ug/min via INTRAVENOUS

## 2023-05-22 MED ORDER — ORAL CARE MOUTH RINSE: 15.0000 mL | Freq: Once | OROMUCOSAL | Status: AC

## 2023-05-22 MED ORDER — ONDANSETRON HCL 4 MG PO TABS: 4.0000 mg | ORAL_TABLET | Freq: Three times a day (TID) | ORAL | 1 refills | Status: AC | PRN

## 2023-05-22 MED ORDER — ACETAMINOPHEN 10 MG/ML IV SOLN
1000.0000 mg | Freq: Once | INTRAVENOUS | Status: AC
  Administered 2023-05-22: 1000 mg via INTRAVENOUS

## 2023-05-22 MED ORDER — SCOPOLAMINE 1 MG/3DAYS TD PT72
MEDICATED_PATCH | TRANSDERMAL | Status: AC
  Filled 2023-05-22: qty 1

## 2023-05-22 MED ORDER — BUPIVACAINE HCL (PF) 0.5 % IJ SOLN
INTRAMUSCULAR | Status: AC
  Filled 2023-05-22: qty 30

## 2023-05-22 MED ORDER — ONDANSETRON HCL 4 MG/2ML IJ SOLN
4.0000 mg | Freq: Once | INTRAMUSCULAR | Status: AC | PRN
  Administered 2023-05-22: 4 mg via INTRAVENOUS
  Filled 2023-05-22: qty 2

## 2023-05-22 MED ORDER — ROCURONIUM BROMIDE 10 MG/ML (PF) SYRINGE
PREFILLED_SYRINGE | INTRAVENOUS | Status: DC | PRN
  Administered 2023-05-22: 10 mg via INTRAVENOUS
  Administered 2023-05-22: 50 mg via INTRAVENOUS

## 2023-05-22 MED ORDER — BUPIVACAINE HCL (PF) 0.5 % IJ SOLN
INTRAMUSCULAR | Status: DC | PRN
  Administered 2023-05-22: 30 mL

## 2023-05-22 MED ORDER — INDOCYANINE GREEN 25 MG IV SOLR
2.5000 mg | Freq: Once | INTRAVENOUS | Status: AC
  Filled 2023-05-22: qty 10

## 2023-05-22 MED ORDER — FENTANYL CITRATE (PF) 250 MCG/5ML IJ SOLN
INTRAMUSCULAR | Status: DC | PRN
  Administered 2023-05-22 (×4): 50 ug via INTRAVENOUS

## 2023-05-22 MED ORDER — STERILE WATER FOR IRRIGATION IR SOLN
Status: DC | PRN
  Administered 2023-05-22: 500 mL

## 2023-05-22 MED ORDER — DEXAMETHASONE SODIUM PHOSPHATE 10 MG/ML IJ SOLN
INTRAMUSCULAR | Status: AC
  Filled 2023-05-22: qty 1

## 2023-05-22 MED ORDER — SUGAMMADEX SODIUM 200 MG/2ML IV SOLN
INTRAVENOUS | Status: DC | PRN
  Administered 2023-05-22: 200 mg via INTRAVENOUS

## 2023-05-22 MED ORDER — ACETAMINOPHEN 10 MG/ML IV SOLN
INTRAVENOUS | Status: AC
  Filled 2023-05-22: qty 100

## 2023-05-22 MED ORDER — INDOCYANINE GREEN 25 MG IV SOLR
INTRAVENOUS | Status: AC
  Administered 2023-05-22: 2.5 mg via INTRAVENOUS
  Filled 2023-05-22: qty 10

## 2023-05-22 MED ORDER — LIDOCAINE 2% (20 MG/ML) 5 ML SYRINGE
INTRAMUSCULAR | Status: DC | PRN
  Administered 2023-05-22: 100 mg via INTRAVENOUS

## 2023-05-22 MED ORDER — SCOPOLAMINE 1 MG/3DAYS TD PT72: 1.0000 | MEDICATED_PATCH | Freq: Once | TRANSDERMAL | Status: DC

## 2023-05-22 SURGICAL SUPPLY — 45 items
ADH SKN CLS APL DERMABOND .7 (GAUZE/BANDAGES/DRESSINGS) ×1
APL PRP STRL LF DISP 70% ISPRP (MISCELLANEOUS) ×1
BLADE SURG 15 STRL LF DISP TIS (BLADE) ×1 IMPLANT
BLADE SURG 15 STRL SS (BLADE) ×1
CAUTERY HOOK MNPLR 1.6 DVNC XI (INSTRUMENTS) ×1 IMPLANT
CHLORAPREP W/TINT 26 (MISCELLANEOUS) ×1 IMPLANT
CLIP LIGATING HEM O LOK PURPLE (MISCELLANEOUS) ×1 IMPLANT
COVER LIGHT HANDLE STERIS (MISCELLANEOUS) ×2 IMPLANT
COVER TIP SHEARS 8 DVNC (MISCELLANEOUS) ×1 IMPLANT
DERMABOND ADVANCED .7 DNX12 (GAUZE/BANDAGES/DRESSINGS) ×1 IMPLANT
DRAPE ARM DVNC X/XI (DISPOSABLE) ×4 IMPLANT
DRAPE COLUMN DVNC XI (DISPOSABLE) ×1 IMPLANT
ELECT REM PT RETURN 9FT ADLT (ELECTROSURGICAL) ×1
ELECTRODE REM PT RTRN 9FT ADLT (ELECTROSURGICAL) ×1 IMPLANT
FORCEPS BPLR R/ABLATION 8 DVNC (INSTRUMENTS) ×1 IMPLANT
FORCEPS PROGRASP DVNC XI (FORCEP) ×1 IMPLANT
GLOVE BIO SURGEON STRL SZ 6.5 (GLOVE) ×2 IMPLANT
GLOVE BIOGEL PI IND STRL 6.5 (GLOVE) ×2 IMPLANT
GLOVE BIOGEL PI IND STRL 7.0 (GLOVE) ×2 IMPLANT
GOWN STRL REUS W/TWL LRG LVL3 (GOWN DISPOSABLE) ×4 IMPLANT
GRASPER SUT TROCAR 14GX15 (MISCELLANEOUS) IMPLANT
IRRIGATOR SUCT 8 DISP DVNC XI (IRRIGATION / IRRIGATOR) IMPLANT
IV NS IRRIG 3000ML ARTHROMATIC (IV SOLUTION) ×1 IMPLANT
KIT TURNOVER KIT A (KITS) ×1 IMPLANT
MANIFOLD NEPTUNE II (INSTRUMENTS) IMPLANT
NDL HYPO 21X1.5 SAFETY (NEEDLE) ×1 IMPLANT
NDL INSUFFLATION 14GA 120MM (NEEDLE) ×1 IMPLANT
NEEDLE HYPO 21X1.5 SAFETY (NEEDLE) ×1 IMPLANT
NEEDLE INSUFFLATION 14GA 120MM (NEEDLE) ×1 IMPLANT
OBTURATOR OPTICAL STND 8 DVNC (TROCAR) ×1
OBTURATOR OPTICALSTD 8 DVNC (TROCAR) ×1 IMPLANT
PACK LAP CHOLE LZT030E (CUSTOM PROCEDURE TRAY) ×1 IMPLANT
PAD ARMBOARD 7.5X6 YLW CONV (MISCELLANEOUS) ×1 IMPLANT
PENCIL HANDSWITCHING (ELECTRODE) ×1 IMPLANT
SCISSORS MNPLR CVD DVNC XI (INSTRUMENTS) ×1 IMPLANT
SEAL CANN UNIV 5-8 DVNC XI (MISCELLANEOUS) ×4 IMPLANT
SET BASIN LINEN APH (SET/KITS/TRAYS/PACK) ×1 IMPLANT
SET TUBE SMOKE EVAC HIGH FLOW (TUBING) ×1 IMPLANT
SUT MNCRL AB 4-0 PS2 18 (SUTURE) ×2 IMPLANT
SUT VICRYL 0 AB UR-6 (SUTURE) IMPLANT
SYR 30ML LL (SYRINGE) ×1 IMPLANT
SYS RETRIEVAL 5MM INZII UNIV (BASKET) ×1
SYSTEM RETRIEVL 5MM INZII UNIV (BASKET) ×1 IMPLANT
TUBE CONNECTING 12X1/4 (SUCTIONS) IMPLANT
WATER STERILE IRR 500ML POUR (IV SOLUTION) ×1 IMPLANT

## 2023-05-22 NOTE — Transfer of Care (Signed)
Immediate Anesthesia Transfer of Care Note  Patient: Tammy Calderon  Procedure(s) Performed: XI ROBOTIC ASSISTED LAPAROSCOPIC CHOLECYSTECTOMY (Abdomen)  Patient Location: PACU  Anesthesia Type:General  Level of Consciousness: awake  Airway & Oxygen Therapy: Patient Spontanous Breathing and Patient connected to face mask oxygen  Post-op Assessment: Report given to RN and Post -op Vital signs reviewed and stable  Post vital signs: Reviewed and stable  Last Vitals:  Vitals Value Taken Time  BP 126/69 05/22/23 1210  Temp    Pulse 73 05/22/23 1212  Resp 18 05/22/23 1212  SpO2 93 % 05/22/23 1212  Vitals shown include unvalidated device data.  Last Pain:  Vitals:   05/22/23 0931  TempSrc: Oral  PainSc: 0-No pain         Complications: No notable events documented.

## 2023-05-22 NOTE — Progress Notes (Signed)
[facility]  Updated husband over phone. Did well. Rx sent to [facility]. Phone call 6/19 unless patient needs to see me in person.  Algis Greenhouse, MD [facility] [address] [place], [place] [address] [phone] (office)

## 2023-05-22 NOTE — Anesthesia Preprocedure Evaluation (Signed)
Anesthesia Evaluation  Patient identified by MRN, date of birth, ID band Patient awake    Reviewed: Allergy & Precautions, H&P , NPO status , Patient's Chart, lab work & pertinent test results, reviewed documented beta blocker date and time   History of Anesthesia Complications (+) PONV and history of anesthetic complications  Airway Mallampati: II  TM Distance: >3 FB Neck ROM: full    Dental no notable dental hx.    Pulmonary neg pulmonary ROS, asthma    Pulmonary exam normal breath sounds clear to auscultation       Cardiovascular Exercise Tolerance: Good hypertension, negative cardio ROS  Rhythm:regular Rate:Normal     Neuro/Psych  PSYCHIATRIC DISORDERS Anxiety Depression     Neuromuscular disease negative neurological ROS  negative psych ROS   GI/Hepatic negative GI ROS, Neg liver ROS,GERD  ,,  Endo/Other  negative endocrine ROS    Renal/GU negative Renal ROS  negative genitourinary   Musculoskeletal   Abdominal   Peds  Hematology negative hematology ROS (+) Blood dyscrasia, anemia   Anesthesia Other Findings   Reproductive/Obstetrics negative OB ROS                             Anesthesia Physical Anesthesia Plan  ASA: 3  Anesthesia Plan: General and General ETT   Post-op Pain Management:    Induction:   PONV Risk Score and Plan: Ondansetron and Scopolamine patch - Pre-op  Airway Management Planned:   Additional Equipment:   Intra-op Plan:   Post-operative Plan:   Informed Consent: I have reviewed the patients History and Physical, chart, labs and discussed the procedure including the risks, benefits and alternatives for the proposed anesthesia with the patient or authorized representative who has indicated his/her understanding and acceptance.     Dental Advisory Given  Plan Discussed with: CRNA  Anesthesia Plan Comments:        Anesthesia Quick  Evaluation

## 2023-05-22 NOTE — Interval H&P Note (Signed)
History and Physical Interval Note:  05/22/2023 10:40 AM  Tammy Calderon  has presented today for surgery, with the diagnosis of Cholelithiasis.  The various methods of treatment have been discussed with the patient and family. After consideration of risks, benefits and other options for treatment, the patient has consented to  Procedure(s): XI ROBOTIC ASSISTED LAPAROSCOPIC CHOLECYSTECTOMY (N/A) as a surgical intervention.  The patient's history has been reviewed, patient examined, no change in status, stable for surgery.  I have reviewed the patient's chart and labs.  Questions were answered to the patient's satisfaction.     [name]

## 2023-05-22 NOTE — Op Note (Signed)
[facility] Operative Note  05/22/23  Preoperative Diagnosis: Symptomatic Cholelithiasis   Postoperative Diagnosis: Same   Procedure(s) Performed: Robotic Assisted Laparoscopic Cholecystectomy   Surgeon: Leatrice Jewels. Henreitta Leber, MD   Assistants: No qualified resident was available    Anesthesia: General endotracheal   Anesthesiologist: Windell Norfolk, MD    Specimens: Gallbladder   Estimated Blood Loss: Minimal   Blood Replacement: None    Complications: None   Wound Class: Clean contaminated   Operative Indications: The patient was found to have stones on imaging and was symptomatic.  We discussed the risk of the procedure including but not limited to bleeding, infection, injury to the common bile duct, bile leak, need for further procedures, chance of subtotal cholecystectomy.   Findings:  Stones in gallbladder Critical view of safety noted All clips intact at the end of the case Adequate hemostasis   Procedure: Firefly was given in the preoperative area. The patient was taken to the operating room and placed supine. General endotracheal anesthesia was induced. Intravenous antibiotics were  administered per protocol.  An orogastric tube positioned to decompress the stomach. The abdomen was prepared and draped in the usual sterile fashion.   Veress needle was placed at the supraumbilical area and insufflation was started after confirming a positive saline drop test and no immediate increase in abdominal pressure.  After reaching 15 mm, the Veress needle was removed and a 8 mm port was placed via optiview technique supraumbilical, measuring 20 mm away from the suspected position of the gallbladder.  The abdomen was inspected and no abnormalities or injuries were found.  Under direct vision, ports were placed in the following locations in a semi curvilinear position around the target of the gallbladder: Two 8 mm ports on the patient's right each having 8cm  clearance to the adjacent ports and one 8 mm port placed on the patient's left 8 cm from the umbilical port. Once ports were placed, the table was placed in the reverse Trendelenburg position with the right side up. The Xi platform was brought into the operative field and docked to the ports successfully.  An endoscope was placed through the umbilical port, prograsper through the most lateral right port, forced bipolar to the port just right of the umbilicus, and then a hook cautery in the left port.   The dome of the gallbladder was grasped with prograsp and retracted over the dome of the liver. Adhesions between the gallbladder and omentum, duodenum and transverse colon were lysed via hook cautery. The infundibulum was grasped with the fenestrated grasper and retracted toward the right lower quadrant. This maneuver exposed Calot's triangle. Firefly was used throughout the dissection to ensure safe visualization of the cystic duct.The peritoneum overlying the gallbladder infundibulum was then dissected and the cystic duct and cystic artery identified.  Critical view of safety with the liver bed clearly visible behind the duct and artery with no additional structures noted.  The cystic duct and cystic artery were doubly clipped and divided close to the gallbladder.    The gallbladder was then dissected from its peritoneal and liver bed attachments by electrocautery. Hemostasis was checked prior to removing the hook cautery.   A 5mm Endo Catch bag was then placed through the left side port and the gallbladder was removed.  The gallbladder was passed off the table as a specimen. There was no evidence of bleeding from the gallbladder fossa or cystic artery or leakage of the bile from the cystic duct stump. The  [employer] was undocked and moved out of the field.  A PMI closed the left sided port. The abdomen was desufflated and secondary trocars were removed under direct vision.  Bleeding was controlled from the port  sites. All skin incisions were closed with subcuticular sutures of 4-0 monocryl and dermabond.   Final inspection revealed acceptable hemostasis. All counts were correct at the end of the case. The patient was awakened from anesthesia and extubated without complication. The OG tube was removed.  The patient went to the PACU in stable condition.   Algis Greenhouse, MD [facility] [address] [place], [place] [address] [phone] (office)

## 2023-05-22 NOTE — Discharge Instructions (Addendum)
Discharge Robotic Assisted Laparoscopic Surgery Instructions:  Common Complaints: Right shoulder pain is common after laparoscopic surgery.  This is secondary to the gas used in the surgery being trapped under the diaphragm.  Walk to help your body absorb the gas. This will improve in a few days. Pain at the port sites are common, especially the larger port sites. This will improve with time.  Some nausea is common and poor appetite. The main goal is to stay hydrated the first few days after surgery.   Diet/ Activity: Diet as tolerated. You may not have an appetite, but it is important to stay hydrated.  Drink 64 ounces of water a day. Your appetite will return with time.  Shower per your regular routine daily.  Do not take hot showers. Take warm showers that are less than 10 minutes. Rest and listen to your body, but do not remain in bed all day.  Walk everyday for at least 15-20 minutes. Deep cough and move around every 1-2 hours in the first few days after surgery.  Do not lift > 10 lbs, perform excessive bending, pushing, pulling, squatting for 1-2 weeks after surgery.  Do not pick at the dermabond glue on your incision sites.  This glue film will remain in place for 1-2 weeks and will start to peel off.  Do not place lotions or balms on your incision unless instructed to specifically by Dr. Henreitta Leber.   Pain Expectations and Narcotics: -After surgery you will have pain associated with your incisions and this is normal. The pain is muscular and nerve pain, and will get better with time. -You are encouraged and expected to take non narcotic medications like tylenol and ibuprofen (when able) to treat pain as multiple modalities can aid with pain treatment. -Narcotics are only used when pain is severe or there is breakthrough pain. -You are not expected to have a pain score of 0 after surgery, as we cannot prevent pain. A pain score of 3-4 that allows you to be functional, move, walk, and  tolerate some activity is the goal. The pain will continue to improve over the days after surgery and is dependent on your surgery. -Due to [place] law, we are only able to give a certain amount of pain medication to treat post operative pain, and we only give additional narcotics on a patient by patient basis.  -For most laparoscopic surgery, studies have shown that the majority of patients only need 10-15 narcotic pills, and for open surgeries most patients only need 15-20.   -Having appropriate expectations of pain and knowledge of pain management with non narcotics is important as we do not want anyone to become addicted to narcotic pain medication.  -Using ice packs in the first 48 hours and heating pads after 48 hours, wearing an abdominal binder (when recommended), and using over the counter medications are all ways to help with pain management.   -Simple acts like meditation and mindfulness practices after surgery can also help with pain control and research has proven the benefit of these practices.  Medication: Take tylenol and ibuprofen as needed for pain control, alternating every 4-6 hours.  Example:  Tylenol 1000mg  @ 6am, 12noon, 6pm, (Do not exceed 4000mg  of tylenol a day). Ibuprofen 800mg  @ 9am, 3pm, 9pm, 3am (Do not exceed 3600mg  of ibuprofen a day).  Take Roxicodone for breakthrough pain every 4 hours.  Take Colace for constipation related to narcotic pain medication. If you do not have a bowel movement in  2 days, take Miralax over the counter.  Drink plenty of water to also prevent constipation.  You had a dose of IV Tylenol in our recovery room at 1:30pm today.  You may have PO(pill form) Tylenol no earlier than 7:30pm this evening.  Contact Information: If you have questions or concerns, please call our office, [phone], Monday- Thursday 8AM-5PM and Friday 8AM-12Noon.  If it is after hours or on the weekend, please call [facility] Main Number, [phone], [phone],  and ask to speak to the surgeon on call for Dr. Henreitta Leber at [facility].

## 2023-05-22 NOTE — Anesthesia Procedure Notes (Signed)
Procedure Name: Intubation Date/Time: 05/22/2023 10:50 AM  Performed by: Julian Reil, CRNAPre-anesthesia Checklist: Patient identified, Emergency Drugs available, Suction available and Patient being monitored Patient Re-evaluated:Patient Re-evaluated prior to induction Oxygen Delivery Method: Circle system utilized Preoxygenation: Pre-oxygenation with 100% oxygen Induction Type: IV induction Ventilation: Mask ventilation without difficulty Laryngoscope Size: Miller and 3 Grade View: Grade I Tube type: Oral Tube size: 7.5 mm Number of attempts: 1 Airway Equipment and Method: Stylet Placement Confirmation: ETT inserted through vocal cords under direct vision, positive ETCO2 and breath sounds checked- equal and bilateral Secured at: 22 cm Tube secured with: Tape Dental Injury: Teeth and Oropharynx as per pre-operative assessment  Comments: 4x4s bite block used at end of proc.

## 2023-05-25 LAB — SURGICAL PATHOLOGY

## 2023-05-28 NOTE — Anesthesia Postprocedure Evaluation (Signed)
Anesthesia Post Note  Patient: Tammy Calderon  Procedure(s) Performed: XI ROBOTIC ASSISTED LAPAROSCOPIC CHOLECYSTECTOMY (Abdomen)  Patient location during evaluation: Phase II Anesthesia Type: General Level of consciousness: awake Pain management: pain level controlled Vital Signs Assessment: post-procedure vital signs reviewed and stable Respiratory status: spontaneous breathing and respiratory function stable Cardiovascular status: blood pressure returned to baseline and stable Postop Assessment: no headache and no apparent nausea or vomiting Anesthetic complications: no Comments: Late entry   No notable events documented.   Last Vitals:  Vitals:   05/22/23 1345 05/22/23 1400  BP:  (!) 151/71  Pulse:  64  Resp:  16  Temp:  (!) 36.3 C  SpO2: 94% 94%    Last Pain:  Vitals:   05/25/23 1244  TempSrc:   PainSc: 3                  Windell Norfolk

## 2023-06-10 ENCOUNTER — Encounter: Payer: Medicaid Other | Admitting: General Surgery

## 2023-06-10 ENCOUNTER — Ambulatory Visit (INDEPENDENT_AMBULATORY_CARE_PROVIDER_SITE_OTHER): Payer: Medicaid Other | Admitting: General Surgery

## 2023-06-10 ENCOUNTER — Encounter: Payer: Self-pay | Admitting: General Surgery

## 2023-06-10 VITALS — BP 142/82 | HR 69 | Temp 98.1°F | Resp 14 | Ht 60.75 in | Wt 243.0 lb

## 2023-06-10 DIAGNOSIS — K8020 Calculus of gallbladder without cholecystitis without obstruction: Secondary | ICD-10-CM

## 2023-06-10 NOTE — Progress Notes (Signed)
[facility]  Patient s/p robotic assisted laparoscopic cholecystectomy. Worried about the left sided port site. Says it is dimpled in and painful at times.  BP (!) 142/82   Pulse 69   Temp 98.1 F (36.7 C) (Oral)   Resp 14   Ht 5' 0.75" (1.543 m)   Wt 243 lb (110.2 kg)   LMP 10/06/2011   SpO2 96%   BMI 46.29 kg/m  Port sites with no erythema or drainage  No signs of infection. Left port site with minor dimpling of skin /fat at this area and no drainage or erythema   Explained this is where the gallbladder was removed and that I suture it closed. Discussed that the suture is absorbable and this should improve with time.   If skin remained dimpled in and this was apart months from now I could go in a release the area if needed. But I think it will improve on its on. Call with continued concerns     Algis Greenhouse, MD [facility] [address] [place], [place] [address] [phone] (office)

## 2023-06-15 ENCOUNTER — Ambulatory Visit: Payer: Medicaid Other | Admitting: Family Medicine

## 2023-06-20 DIAGNOSIS — T8130XA Disruption of wound, unspecified, initial encounter: Secondary | ICD-10-CM | POA: Diagnosis not present

## 2023-06-20 DIAGNOSIS — L03311 Cellulitis of abdominal wall: Secondary | ICD-10-CM | POA: Diagnosis not present

## 2023-06-20 DIAGNOSIS — I10 Essential (primary) hypertension: Secondary | ICD-10-CM | POA: Diagnosis not present

## 2023-07-01 ENCOUNTER — Ambulatory Visit (INDEPENDENT_AMBULATORY_CARE_PROVIDER_SITE_OTHER): Payer: Medicaid Other | Admitting: General Surgery

## 2023-07-01 ENCOUNTER — Encounter: Payer: Self-pay | Admitting: General Surgery

## 2023-07-01 VITALS — BP 135/81 | HR 70 | Temp 98.2°F | Resp 14 | Ht 61.0 in | Wt 247.0 lb

## 2023-07-01 DIAGNOSIS — K8020 Calculus of gallbladder without cholecystitis without obstruction: Secondary | ICD-10-CM

## 2023-07-01 MED ORDER — AMOXICILLIN-POT CLAVULANATE 875-125 MG PO TABS: 1.0000 | ORAL_TABLET | Freq: Two times a day (BID) | ORAL | 0 refills | Status: AC

## 2023-07-01 NOTE — Progress Notes (Signed)
[facility]  [place] site right of the umbilical site has been draining and opened up. Received antibiotics from urgent care.   Area still draining. Left sided site were it had dimpled is resolved and no pain or issues there.  BP 135/81   Pulse 70   Temp 98.2 F (36.8 C) (Oral)   Resp 14   Ht 5\' 1"  (1.549 m)   Wt 247 lb (112 kg)   LMP 10/06/2011   SpO2 96%   BMI 46.67 kg/m  Some induration and redness around the incision, opening of the incision in 3  small areas   Patient s/p robotic cholecystectomy with a port site that has not healed and has drained. There is some minor redness. She has already been on doxcycline. I think the body is draining it and it will be fine but to be safe will do augmentin to be sure to knock out any potential brewing infection.  Cover and do antibiotic ointment daily. Take antibiotic.   Call with issues Prn follow up   Algis Greenhouse, MD [facility] [address] [place] [place], [place] [address] [phone] (office)

## 2023-07-01 NOTE — Patient Instructions (Signed)
Cover and do antibiotic ointment daily. Take antibiotic.

## 2023-07-27 IMAGING — MG MM PLC BREAST LOC DEV 1ST LESION INC MAMMO GUIDE*L*
8 of 15 series · 8 of 15 positions shown · non-contrast
Comparison: Previous exams.

CLINICAL DATA: 56-year-old female with recently diagnosed flat
epithelial atypia in the upper left breast at site of X shaped
biopsy marking clip and atypical lobular hyperplasia in the left
breast at site of ribbon and wing shaped biopsy marking clips.
Patient presents for radiofrequency tag localization prior to
excision.

EXAM:
RADIOFREQUENCY TAG LOCALIZATION OF THE LEFT BREAST WITH MAMMO
GUIDANCE

[L CC (1 of 6)]
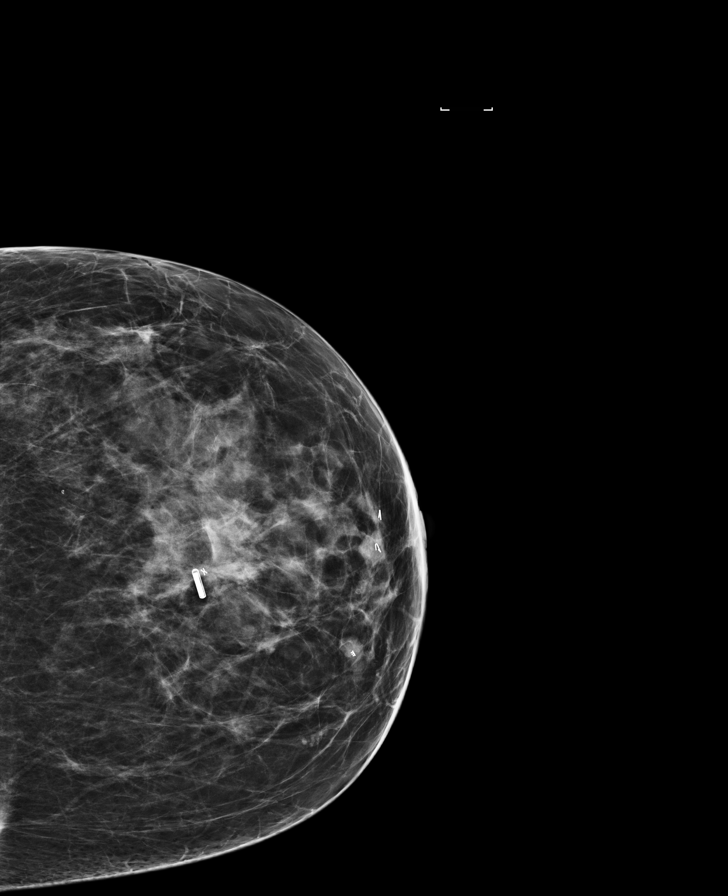

[L CC (2 of 6)]
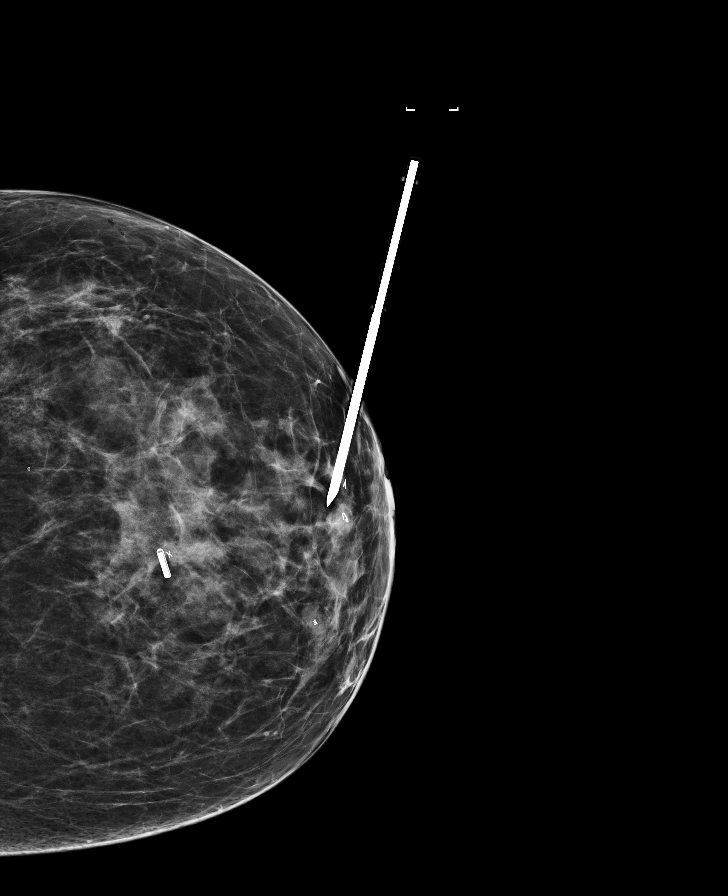

[L LM (1 of 2)]
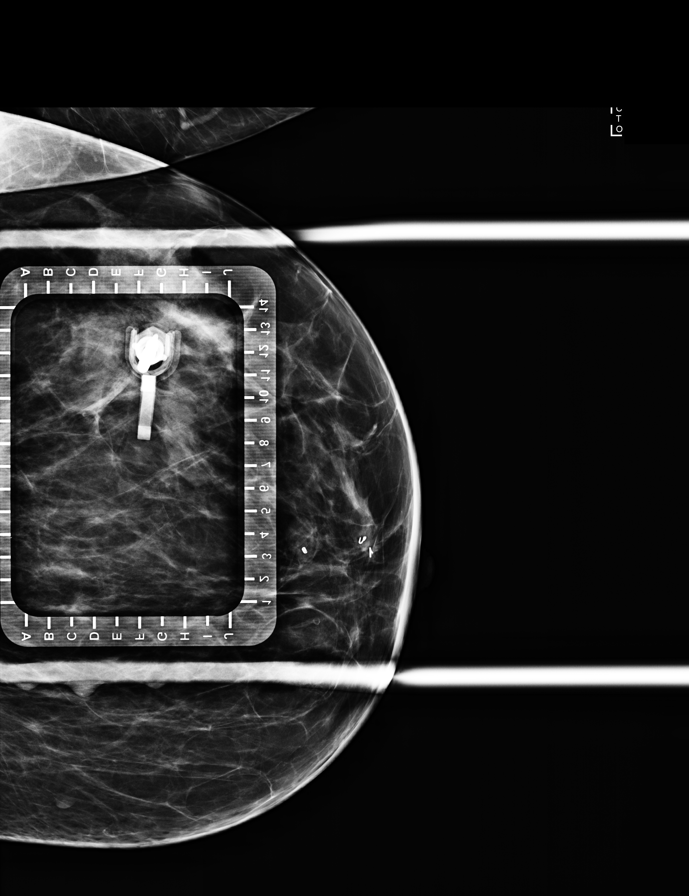

[L CC (3 of 6)]
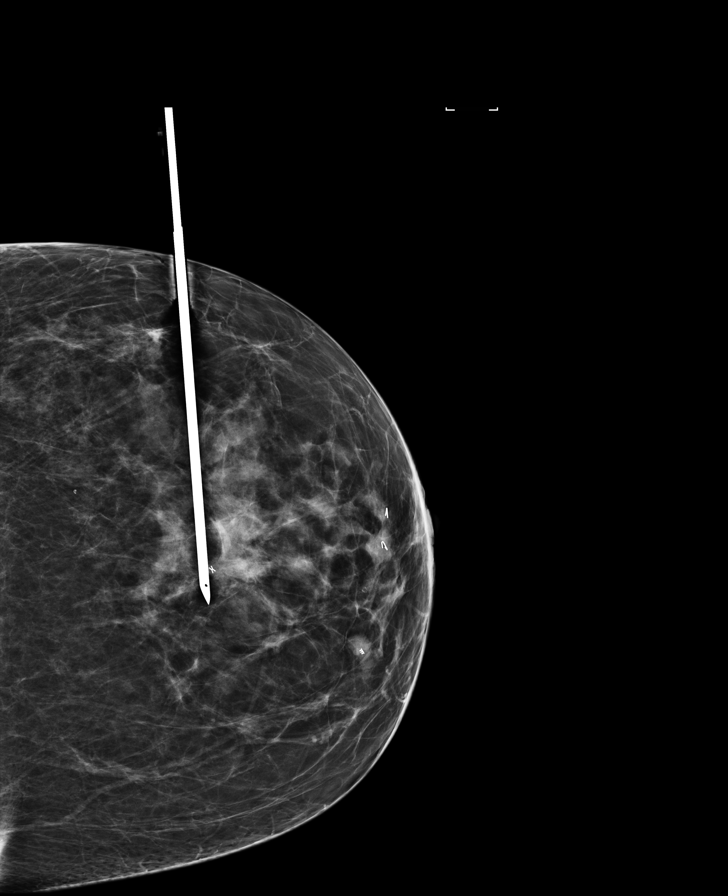

[L CC (4 of 6)]
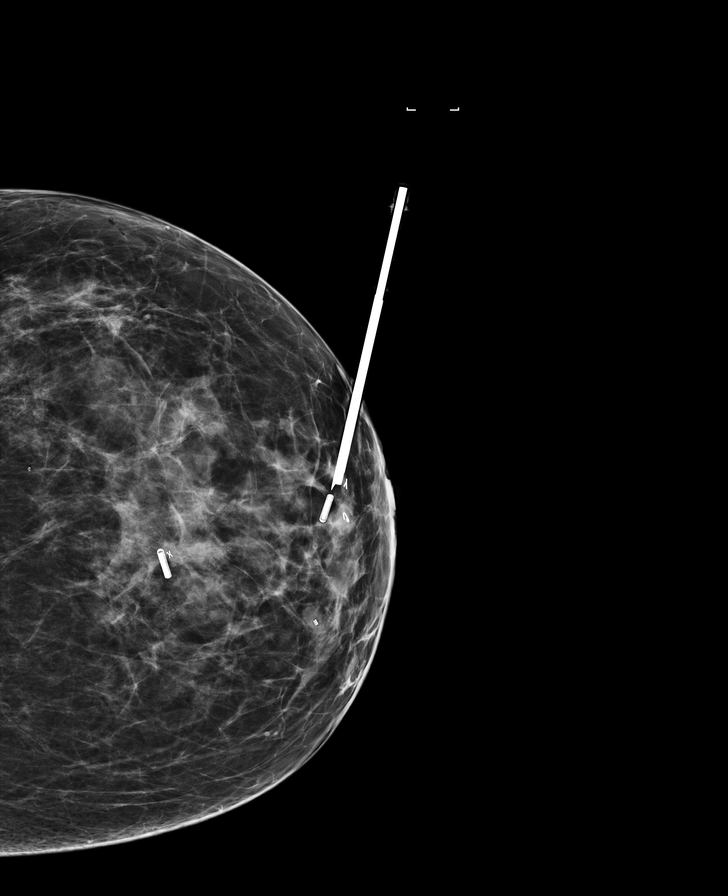

[L CC (5 of 6)]
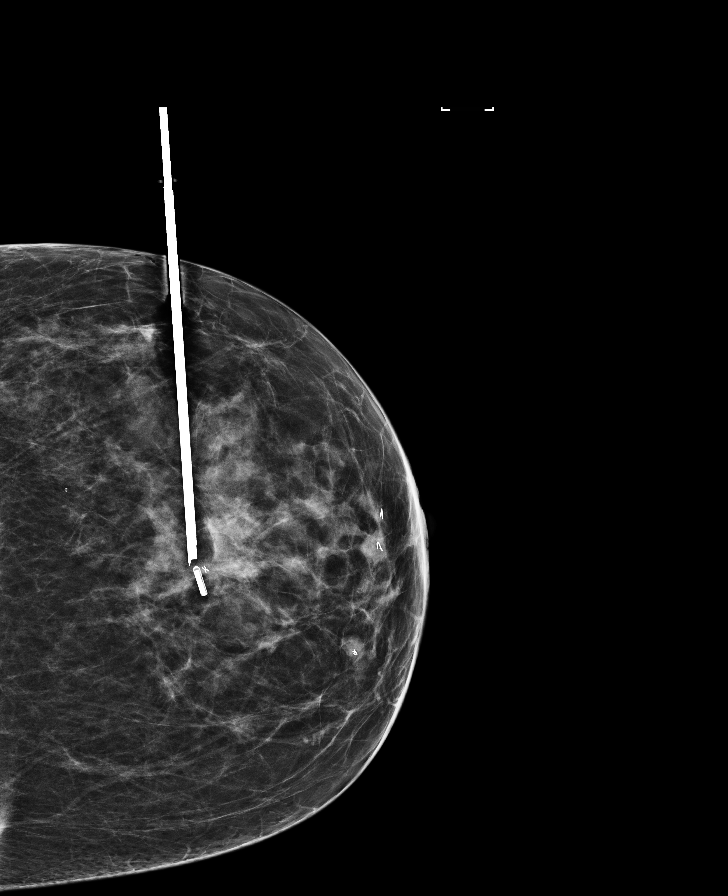

[L LM (2 of 2)]
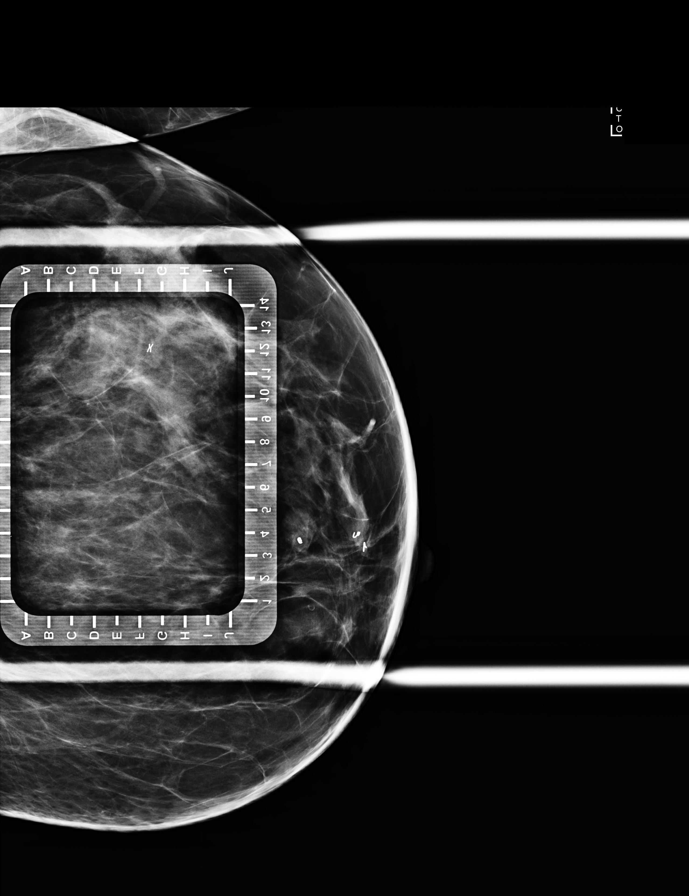

[L CC (6 of 6)]
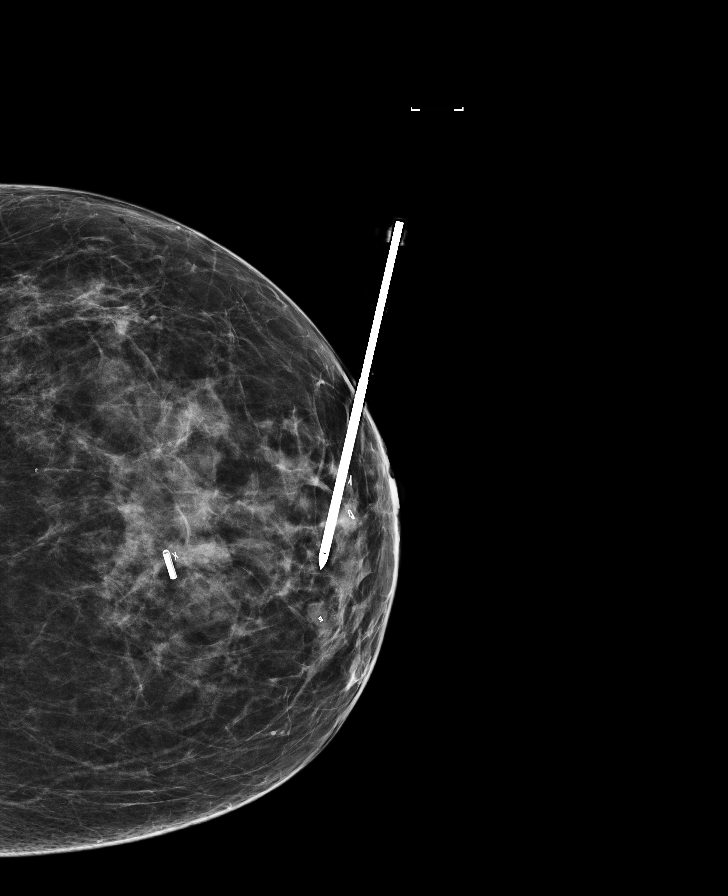

[8 of 15 positions shown; findings below may reference images not displayed]

FINDINGS: Patient presents for needle localization prior to . I met with the
patient and we discussed the procedure of needle localization
including benefits and alternatives. We discussed the high
likelihood of a successful procedure. We discussed the risks of the
procedure, including infection, bleeding, tissue injury, and further
surgery. Informed, written consent was given. The usual time-out
protocol was performed immediately prior to the procedure.

SITE 1: LEFT BREAST SUPERIOR X SHAPED BIOPSY MARKING CLIP (FLAT
EPITHELIAL ATYPIA): Using mammographic guidance, sterile technique,
1% lidocaine and a 10 cm RF tag needle, the X shaped biopsy marking
clip was localized using a lateral to medial approach. RF tag
function was subsequently confirmed. The images were marked for Dr.
Aikz.

SITE 2: LEFT BREAST 12 O'CLOCK (WING) AND [DATE] (RIBBON) POSITION
(ATYPICAL LOBULAR HYPERPLASIA): Using mammographic guidance, sterile
technique, 1% lidocaine and a 5 cm RF tag needle, the the ribbon and
wing shaped biopsy marking clips were localized using a lateral to
medial approach. RF tag function was subsequently confirmed. The
images were marked for Dr. Aikz.
IMPRESSION: RF tag localization (2 tags) of the left breast.

## 2023-07-30 IMAGING — MG MM BREAST SURGICAL SPECIMEN
1 series · 1 of 1 positions shown · non-contrast
Comparison: Previous exam(s).

CLINICAL DATA: Evaluate surgical specimen following excision of
LEFT breast lobular neoplasia.

EXAM:
SPECIMEN RADIOGRAPH OF THE LEFT BREAST

[L CC]
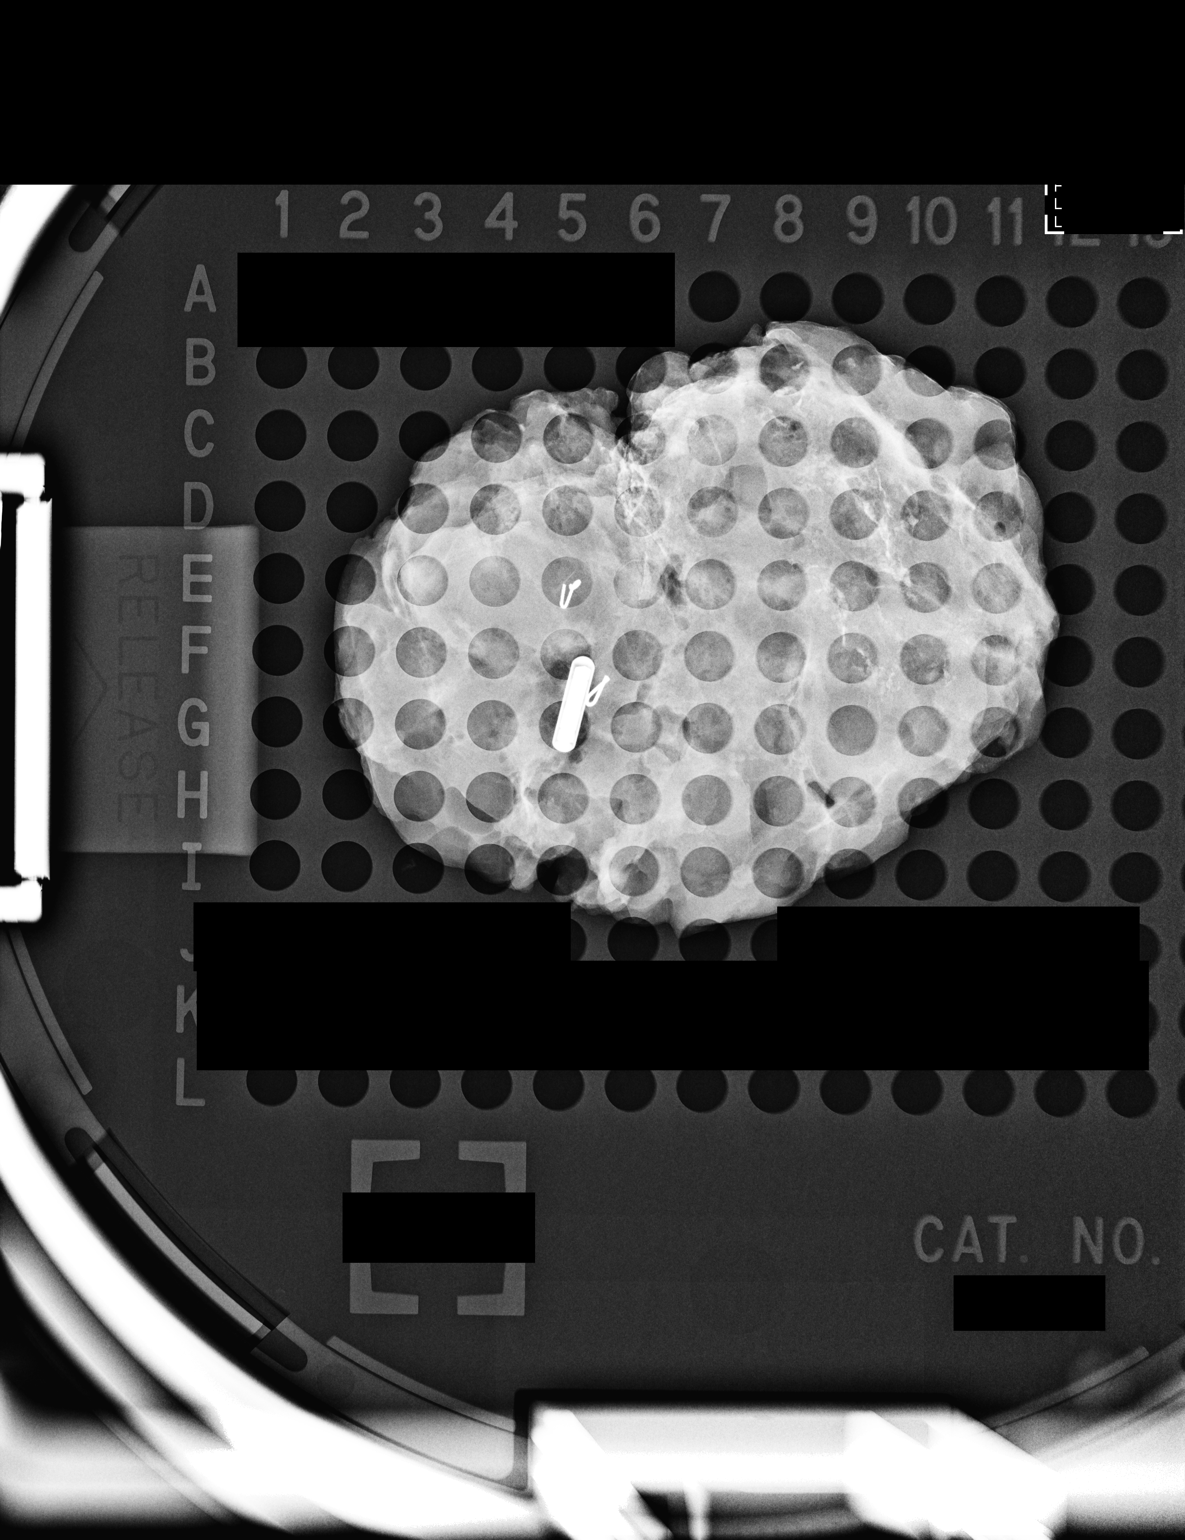

[1 of 1 positions shown; findings below may reference images not displayed]

FINDINGS: Status post excision of the LEFT breast. The RF tag, WING shaped
biopsy clip and RIBBON shaped biopsy clip are present within the
specimen.
IMPRESSION: Specimen radiograph of the LEFT breast.

## 2023-09-29 ENCOUNTER — Encounter (HOSPITAL_COMMUNITY): Payer: Self-pay

## 2023-09-29 ENCOUNTER — Inpatient Hospital Stay: Payer: Medicaid Other | Attending: Hematology

## 2023-09-29 ENCOUNTER — Ambulatory Visit (HOSPITAL_COMMUNITY)
Admission: RE | Admit: 2023-09-29 | Discharge: 2023-09-29 | Disposition: A | Discharge status: Home / Self Care | Payer: Medicaid Other | Source: Ambulatory Visit | Attending: Hematology | Admitting: Hematology

## 2023-09-29 DIAGNOSIS — R92323 Mammographic fibroglandular density, bilateral breasts: Secondary | ICD-10-CM | POA: Diagnosis not present

## 2023-09-29 DIAGNOSIS — C541 Malignant neoplasm of endometrium: Secondary | ICD-10-CM | POA: Insufficient documentation

## 2023-09-29 DIAGNOSIS — D0512 Intraductal carcinoma in situ of left breast: Secondary | ICD-10-CM

## 2023-09-29 LAB — COMPREHENSIVE METABOLIC PANEL
ALT: 18 U/L (ref 0–44)
AST: 22 U/L (ref 15–41)
Albumin: 3.9 g/dL (ref 3.5–5.0)
Alkaline Phosphatase: 83 U/L (ref 38–126)
Anion gap: 10 (ref 5–15)
BUN: 19 mg/dL (ref 6–20)
CO2: 24 mmol/L (ref 22–32)
Calcium: 9.3 mg/dL (ref 8.9–10.3)
Chloride: 103 mmol/L (ref 98–111)
Creatinine, Ser: 0.78 mg/dL (ref 0.44–1.00)
GFR, Estimated: >60 mL/min (ref >60)
Glucose, Bld: 95 mg/dL (ref 70–99)
Potassium: 3.7 mmol/L (ref 3.5–5.1)
Sodium: 137 mmol/L (ref 135–145)
Total Bilirubin: 0.6 mg/dL (ref 0.3–1.2)
Total Protein: 7.2 g/dL (ref 6.5–8.1)

## 2023-09-29 LAB — CBC WITH DIFFERENTIAL/PLATELET
Abs Immature Granulocytes: 0.03 10*3/uL (ref 0.00–0.07)
Basophils Absolute: 0.1 10*3/uL (ref 0.0–0.1)
Basophils Relative: 1 %
Eosinophils Absolute: 0.4 10*3/uL (ref 0.0–0.5)
Eosinophils Relative: 7 %
HCT: 35.4 % — ABNORMAL LOW (ref 36.0–46.0)
Hemoglobin: 11.6 g/dL — ABNORMAL LOW (ref 12.0–15.0)
Immature Granulocytes: 1 %
Lymphocytes Relative: 19 %
Lymphs Abs: 1.3 10*3/uL (ref 0.7–4.0)
MCH: 29.4 pg (ref 26.0–34.0)
MCHC: 32.8 g/dL (ref 30.0–36.0)
MCV: 89.8 fL (ref 80.0–100.0)
Monocytes Absolute: 0.4 10*3/uL (ref 0.1–1.0)
Monocytes Relative: 6 %
Neutro Abs: 4.4 10*3/uL (ref 1.7–7.7)
Neutrophils Relative %: 66 %
Platelets: 227 10*3/uL (ref 150–400)
RBC: 3.94 MIL/uL (ref 3.87–5.11)
RDW: 13.4 % (ref 11.5–15.5)
WBC: 6.7 10*3/uL (ref 4.0–10.5)
nRBC: 0 % (ref 0.0–0.2)

## 2023-09-29 LAB — VITAMIN D 25 HYDROXY (VIT D DEFICIENCY, FRACTURES): Vit D, 25-Hydroxy: 26.77 ng/mL — ABNORMAL LOW (ref 30–100)

## 2023-10-05 ENCOUNTER — Inpatient Hospital Stay: Payer: Medicaid Other | Admitting: Hematology

## 2023-10-13 ENCOUNTER — Ambulatory Visit: Payer: Medicaid Other | Admitting: Hematology

## 2023-10-13 ENCOUNTER — Telehealth: Payer: Self-pay | Admitting: Family Medicine

## 2023-10-13 NOTE — Telephone Encounter (Signed)
May have 3 refills of the albuterol inhaler May have 90-day on the Zestoretic 20-12.51 daily May have 2 refills on the ibuprofen Recommend a follow-up office visit with myself within the next 60 days please go ahead and schedule

## 2023-10-13 NOTE — Telephone Encounter (Addendum)
Prescription Request  10/13/2023  LOV: 03/09/2023  What is the name of the medication or equipment? albuterol (VENTOLIN HFA) 108 (90 Base) MCG/ACT inhaler  and lisinopril-hydrochlorothiazide (ZESTORETIC) 20-12.5 MG tablet and ibuprofen (ADVIL) 600 MG tablet   Have you contacted your pharmacy to request a refill? Yes   Which pharmacy would you like this sent to?  [facility] [address] [place] [address] [place] [address] Phone: [phone] Fax: [phone]    Patient notified that their request is being sent to the clinical staff for review and that they should receive a response within 2 business days.   Please advise at [facility] [phone]

## 2023-10-14 MED ORDER — IBUPROFEN 600 MG PO TABS: 600.0000 mg | ORAL_TABLET | Freq: Three times a day (TID) | ORAL | 2 refills | Status: DC | PRN

## 2023-10-14 MED ORDER — LISINOPRIL-HYDROCHLOROTHIAZIDE 20-12.5 MG PO TABS: ORAL_TABLET | ORAL | 2 refills | Status: DC

## 2023-10-14 MED ORDER — ALBUTEROL SULFATE HFA 108 (90 BASE) MCG/ACT IN AERS: 2.0000 | INHALATION_SPRAY | Freq: Four times a day (QID) | RESPIRATORY_TRACT | 2 refills | Status: DC | PRN

## 2023-10-14 NOTE — Telephone Encounter (Signed)
Received via fax Rx request: Prescription sent electronically to pharmacy  

## 2023-10-27 ENCOUNTER — Other Ambulatory Visit (HOSPITAL_COMMUNITY): Payer: Self-pay | Admitting: Hematology

## 2023-10-27 ENCOUNTER — Inpatient Hospital Stay: Payer: Medicaid Other | Attending: Hematology | Admitting: Hematology

## 2023-10-27 VITALS — BP 130/73 | HR 82 | Temp 98.1°F | Resp 18 | Ht 61.0 in | Wt 248.2 lb

## 2023-10-27 DIAGNOSIS — Z9889 Other specified postprocedural states: Secondary | ICD-10-CM

## 2023-10-27 DIAGNOSIS — D0512 Intraductal carcinoma in situ of left breast: Secondary | ICD-10-CM

## 2023-10-27 DIAGNOSIS — E559 Vitamin D deficiency, unspecified: Secondary | ICD-10-CM | POA: Insufficient documentation

## 2023-10-27 DIAGNOSIS — Z86000 Personal history of in-situ neoplasm of breast: Secondary | ICD-10-CM | POA: Diagnosis not present

## 2023-10-27 NOTE — Patient Instructions (Addendum)
[facility] Discharge Instructions   You were seen and examined today by Dr. Ellin Saba.  He reviewed the results of your lab work which are normal/stable. You vitamin D is slightly low. Dr. Kirtland Bouchard recommends you take Vitamin D 400 units daily in addition to your multivitamin.  We will see you back in one year. We will repeat your mammogram in October of next year.   Return as scheduled.    Thank you for choosing  Can[facility] Center at [facility] to provide your oncology and hematology care.  To afford each patient quality time with our provider, please arrive at least 15 minutes before your scheduled appointment time.   If you have a lab appointment with the Cancer Center please come in thru the Main Entrance and check in at the main information desk.  You need to re-schedule your appointment should you arrive 10 or more minutes late.  We strive to give you quality time with our providers, and arriving late affects you and other patients whose appointments are after yours.  Also, if you no show three or more times for appointments you may be dismissed from the clinic at the providers discretion.     Again, thank you for choosing [facility].  Our hope is that these requests will decrease the amount of time that you wait before being seen by our physicians.       _____________________________________________________________  Should you have questions after your [facility] to [facility], please contact our office at [phone] and follow the prompts.  Our office hours are 8:00 a.m. and 4:30 p.m. Monday - Friday.  Please note that voicemails left after 4:00 p.m. may not be returned until the following business day.  We are closed weekends and major holidays.  You do have access to a nurse 24-7, just call the main number to the clinic [phone] and do not press any options, hold on the line and a nurse will answer the  phone.    For prescription refill requests, have your pharmacy contact our office and allow 72 hours.    Due to Covid, you will need to wear a mask upon entering the hospital. If you do not have a mask, a mask will be given to you at the Main Entrance upon arrival. For doctor visits, patients may have 1 support person age 53 or older with them. For treatment visits, patients can not have anyone with them due to social distancing guidelines and our immunocompromised population.

## 2023-10-27 NOTE — Progress Notes (Signed)
[facility] [address] [place] [address]    Clinic Day:  10/27/2023  Referring physician: Babs Sciara, MD  Patient Care Team: Babs Sciara, MD as PCP - General (Family Medicine) Doreatha Massed, MD as Medical Oncologist (Medical Oncology) Therese Sarah, RN as Oncology Nurse Navigator (Medical Oncology)   ASSESSMENT & PLAN:   Assessment: Left breast DCIS: - Abnormal screening mammogram on 09/19/2021. - Left breast biopsy 12:30 mass-ALH, 12:00 mass-ALH, 9:00 mass fibroadenoma - Left breast lumpectomy on 12/27/2021 - Pathology shows DCIS, grade 2, 0.15 cm, margins negative, atypical lobular hyperplasia.  ER shows weak expression and DCIS.  pTis, pNX       -Completed XRT to the left breast around March 2023       - She recently underwent hysterectomy.  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  Does not require any adjuvant therapy.   Social/family history: - Lives at home with her husband.  She works at [employer].  Non-smoker. - She is adopted.  Biological mother died of uterine cancer.  Plan: Left breast DCIS: - She could not tolerate anastrozole due to panic attacks. - Reviewed mammogram from 09/29/2023: BI-RADS Category 2. - Physical exam: Left breast lumpectomy site in the upper inner quadrant is within normal limits. - Labs from 09/29/2023: Normal LFTs and CBC.  RTC 1 year for follow-up with mammogram and labs.  2.  Vitamin D deficiency: - She is taking multivitamin with calcium and vitamin D in it.  Vitamin D level is 26.7. - Recommend that she take an additional 400 IU of vitamin D.  DEXA scan was normal on 02/03/2022.  3.  Hot flashes: - She has occasional hot flashes at nighttime which are manageable.   Breast Cancer therapy associated bone loss: I have recommended calcium, Vitamin D and weight bearing exercises.  Orders Placed This Encounter  Procedures   MM DIAG BREAST TOMO BILATERAL     Standing Status:   Future    Standing Expiration Date:   10/26/2024    Order Specific Question:   Reason for Exam (SYMPTOM  OR DIAGNOSIS REQUIRED)    Answer:   breast cancer screening    Order Specific Question:   Is the patient pregnant?    Answer:   No    Order Specific Question:   Preferred imaging location?    Answer:   [facility]      I,Helena R Teague,acting as a scribe for Doreatha Massed, MD.,have documented all relevant documentation on the behalf of Doreatha Massed, MD,as directed by  Doreatha Massed, MD while in the presence of Doreatha Massed, MD.   I, Doreatha Massed MD, have reviewed the above documentation for accuracy and completeness, and I agree with the above.   Doreatha Massed, MD   11/5/20244:56 PM  CHIEF COMPLAINT:   Diagnosis: Ductal carcinoma in situ (DCIS) of left breast    Cancer Staging  Ductal carcinoma in situ (DCIS) of left breast Staging form: Breast, AJCC 8th Edition - Clinical stage from 01/20/2022: Stage 0 (cTis (DCIS), cN0, cM0, ER+, PR: Not Assessed, HER2: Not Assessed) - Unsigned    HISTORY OF PRESENT ILLNESS:   Oncology History Overview Note  MMR IHC intact MS stable   Endometrial cancer (HCC)  02/17/2022 Initial Biopsy   EMB: Endometrioid carcinoma, FIGO grade 1.  Prolapsing endocervical mass-benign endocervical polyp.   02/28/2022 Imaging   Pelvic ultrasound shows abnormally thickened heterogenous endometrial  complex measuring 18 mm, simple right ovarian cyst.   03/07/2022 Initial Diagnosis   Endometrial cancer (HCC)   03/19/2022 Surgery   TRH/BSO, bilateral sentinel lymph node biopsy  Findings: On EUA, small mobile uterus, no adnexal masses. On intra-abdominal entry, normal upper abdominal survey.  Normal-appearing omentum, small and large bowel.  Filmy adhesions of the appendix to the sigmoid epiploica and right pelvic sidewall.  Clips noted on the fallopian tube consistent with prior tubal  ligation.  Normal-appearing bilateral adnexa.  Uterus 8 cm and somewhat bulbous at the fundus, otherwise normal in appearance.  Mapping successful bilaterally to obturator sentinel lymph nodes.  Mildly enlarged lymph node on the left with enlarged adjacent lymph node that was also removed.  No obvious extrauterine tumor.  Mild adhesions between the bladder and lower uterine segment/cervix.   03/19/2022 Pathology Results   Stage IA, grade 1 endometrioid adenocarcinoma MI 18% No LVI Negative SLNs (and adjacent LNs)      INTERVAL HISTORY:   Tammy Calderon is a 57 y.o. female presenting to clinic today for follow up of left breast DCIS. She was last seen by me on 10/01/22.  Since her last visit, she had a bilateral diagnostic MM on 09/29/23 that found: stable lumpectomy changes in the left breast and no mammographic evidence of malignancy in either breast. She underwent cholecystectomy on 05/22/23 with Dr. Henreitta Leber for calculus of gallbladder without obstruction or cholecystitis.    Today, she states that she is doing well overall. Her appetite level is at 100%. Her energy level is at 40%. She denies any hot flashes. She takes an OTC multivitamin with vitamin D in it.   PAST MEDICAL HISTORY:   Past Medical History: Past Medical History:  Diagnosis Date   Anemia    past   Anxiety    Asthma    uses albuterol approx 1-2x a week   Bell's palsy    resolved   Breast cancer (HCC)    Depression    Fibromyalgia    GERD (gastroesophageal reflux disease)    Hypertension    PONV (postoperative nausea and vomiting)     Surgical History: Past Surgical History:  Procedure Laterality Date   ABDOMINAL HYSTERECTOMY     BREAST BIOPSY Left 11/11/2021   FLAT EPITHELIAL ATYPIA, USUAL DUCTAL AND COLUMNAR CELL HYPERPLASIA ARISING IN A FIBROADENOMATOID NODULE, PSEUDOANGIOMATOUS STROMAL HYPERPLASIA   BREAST BIOPSY WITH RADIO FREQUENCY LOCALIZER Left 12/27/2021   Procedure: EXCISIONAL BREAST BIOPSY WITH RADIO  FREQUENCY LOCALIZER X2;  Surgeon: [name], MD;  Location: [facility];  Service: General;  Laterality: Left;   BREAST LUMPECTOMY Left 12/27/2021   dcis   LYMPH NODE DISSECTION N/A 03/19/2022   Procedure: LYMPH NODE DISSECTION;  Surgeon: Carver Fila, MD;  Location: [facility];  Service: Gynecology;  Laterality: N/A;   NASAL SEPTUM SURGERY     ROBOTIC ASSISTED TOTAL HYSTERECTOMY WITH BILATERAL SALPINGO OOPHERECTOMY Bilateral 03/19/2022   Procedure: XI ROBOTIC ASSISTED TOTAL HYSTERECTOMY WITH BILATERAL SALPINGO OOPHORECTOMY;  Surgeon: Carver Fila, MD;  Location: [facility];  Service: Gynecology;  Laterality: Bilateral;   SENTINEL NODE BIOPSY N/A 03/19/2022   Procedure: SENTINEL NODE BIOPSY;  Surgeon: Carver Fila, MD;  Location: [facility];  Service: Gynecology;  Laterality: N/A;   SHOULDER ARTHROSCOPY WITH ROTATOR CUFF REPAIR AND OPEN BICEPS TENODESIS Right 07/15/2022   Procedure: RIGHT SHOULDER ARTHROSCOPY WITH ROTATOR CUFF REPAIR AND OPEN BICEPS TENODESIS;  Surgeon: Huel Cote, MD;  Location: [facility];  Service: Orthopedics;  Laterality: Right;   TUBAL LIGATION     WISDOM TOOTH EXTRACTION      Social History: Social History   Socioeconomic History   Marital status: Married    Spouse name: Not on file   Number of children: Not on file   Years of education: Not on file   Highest education level: Not on file  Occupational History   Not on file  Tobacco Use   Smoking status: Never    Passive exposure: Never   Smokeless tobacco: Never  Vaping Use   Vaping status: Never Used  Substance and Sexual Activity   Alcohol use: No   Drug use: Never   Sexual activity: Not Currently    Birth control/protection: Post-menopausal  Other Topics Concern   Not on file  Social History Narrative   Not on file   Social Determinants of Health   Financial Resource Strain: Not on file  Food Insecurity: Not on file  Transportation Needs: Not on file  Physical  Activity: Not on file  Stress: Not on file  Social Connections: Not on file  Intimate Partner Violence: Not on file    Family History: Family History  Adopted: Yes  Problem Relation Age of Onset   Breast cancer Mother    Breast cancer Sister     Current Medications:  Current Outpatient Medications:    albuterol (VENTOLIN HFA) 108 (90 Base) MCG/ACT inhaler, Inhale 2 puffs into the lungs every 6 (six) hours as needed for wheezing., Disp: 1 each, Rfl: 2   diclofenac (VOLTAREN) 75 MG EC tablet, Take 1 tablet (75 mg total) by mouth 2 (two) times daily. (Patient taking differently: Take 75 mg by mouth daily as needed for mild pain (pain score 1-3) or moderate pain (pain score 4-6).), Disp: 30 tablet, Rfl: 2   ibuprofen (ADVIL) 600 MG tablet, Take 1 tablet (600 mg total) by mouth every 8 (eight) hours as needed., Disp: 40 tablet, Rfl: 2   lisinopril-hydrochlorothiazide (ZESTORETIC) 20-12.5 MG tablet, TAKE 1 TABLET BY MOUTH ONCE DAILY . APPOINTMENT REQUIRED FOR FUTURE REFILLS, Disp: 30 tablet, Rfl: 2   Multiple Vitamin (MULTI VITAMIN PO), Take by mouth., Disp: , Rfl:    mupirocin ointment (BACTROBAN) 2 %, Apply 1 Application topically 2 (two) times daily., Disp: , Rfl:    ondansetron (ZOFRAN) 4 MG tablet, Take 1 tablet (4 mg total) by mouth every 8 (eight) hours as needed., Disp: 30 tablet, Rfl: 1   pregabalin (LYRICA) 25 MG capsule, Take 1 capsule (25 mg total) by mouth 2 (two) times daily., Disp: 60 capsule, Rfl: 2   Allergies: Allergies  Allergen Reactions   Misc. Sulfonamide Containing Compounds Anaphylaxis   Sulfa Antibiotics Anaphylaxis   Cymbalta [Duloxetine Hcl]     sedated   Lyrica [Pregabalin]     drowsy   Neurontin [Gabapentin]     drowsy    REVIEW OF SYSTEMS:   Review of Systems  Constitutional:  Negative for chills, fatigue and fever.       +fibromyalgia, 6/10 severity  HENT:   Negative for lump/mass, mouth sores, nosebleeds, sore throat and trouble swallowing.    Eyes:  Negative for eye problems.  Respiratory:  Negative for cough and shortness of breath.   Cardiovascular:  Negative for chest pain, leg swelling and palpitations.  Gastrointestinal:  Negative for abdominal pain, constipation, diarrhea, nausea and vomiting.  Endocrine: Negative for hot flashes.  Genitourinary:  Negative for bladder incontinence, difficulty urinating, dysuria, frequency, hematuria and nocturia.  Musculoskeletal:  Negative for arthralgias, back pain, flank pain, myalgias and neck pain.  Skin:  Negative for itching and rash.  Neurological:  Negative for dizziness, headaches and numbness.  Hematological:  Does not bruise/bleed easily.  Psychiatric/Behavioral:  Positive for depression. Negative for sleep disturbance and suicidal ideas. The patient is nervous/anxious.   All other systems reviewed and are negative.    VITALS:   Blood pressure 130/73, pulse 82, temperature 98.1 F (36.7 C), temperature source Oral, resp. rate 18, height 5\' 1"  (1.549 m), weight 248 lb 3.2 oz (112.6 kg), last menstrual period 10/06/2011, SpO2 98%.  Wt Readings from Last 3 Encounters:  10/27/23 248 lb 3.2 oz (112.6 kg)  07/01/23 247 lb (112 kg)  06/10/23 243 lb (110.2 kg)    Body mass index is 46.9 kg/m.  Performance status (ECOG): 1 - Symptomatic but completely ambulatory  PHYSICAL EXAM:   Physical Exam Vitals and nursing note reviewed. Exam conducted with a chaperone present.  Constitutional:      Appearance: Normal appearance.  Cardiovascular:     Rate and Rhythm: Normal rate and regular rhythm.     Pulses: Normal pulses.     Heart sounds: Normal heart sounds.  Pulmonary:     Effort: Pulmonary effort is normal.     Breath sounds: Normal breath sounds.  Chest:     Comments: +biopsy scar on upper inner quadrant of left breast is tender upon palpation and well-healed +no palpable masses or adenopathy bilaterally  Abdominal:     Palpations: Abdomen is soft. There is no  hepatomegaly, splenomegaly or mass.     Tenderness: There is no abdominal tenderness.  Musculoskeletal:     Right lower leg: No edema.     Left lower leg: No edema.  Lymphadenopathy:     Cervical: No cervical adenopathy.     Right cervical: No superficial, deep or posterior cervical adenopathy.    Left cervical: No superficial, deep or posterior cervical adenopathy.     Upper Body:     Right upper body: No supraclavicular or axillary adenopathy.     Left upper body: No supraclavicular or axillary adenopathy.  Neurological:     General: No focal deficit present.     Mental Status: She is alert and oriented to person, place, and time.  Psychiatric:        Mood and Affect: Mood normal.        Behavior: Behavior normal.   Breast Exam Chaperone: Chapman Moss, RN   LABS:      Latest Ref Rng & Units 09/29/2023    1:11 PM 04/22/2023   10:08 PM 03/18/2023   11:57 AM  CBC  WBC 4.0 - 10.5 K/uL 6.7  5.6  7.6   Hemoglobin 12.0 - 15.0 g/dL 08.6  57.8  46.9   Hematocrit 36.0 - 46.0 % 35.4  34.2  33.8   Platelets 150 - 400 K/uL 227  215  201       Latest Ref Rng & Units 09/29/2023    1:11 PM 04/22/2023   10:08 PM 03/18/2023   11:57 AM  CMP  Glucose 70 - 99 mg/dL 95  629  81   BUN 6 - 20 mg/dL 19  14  30    Creatinine 0.44 - 1.00 mg/dL 5.28  4.13  2.44   Sodium 135 - 145 mmol/L 137  133  134   Potassium 3.5 - 5.1 mmol/L 3.7  3.4  4.1   Chloride 98 - 111 mmol/L  103  99  101   CO2 22 - 32 mmol/L 24  25  26    Calcium 8.9 - 10.3 mg/dL 9.3  9.0  8.3   Total Protein 6.5 - 8.1 g/dL 7.2  6.6    Total Bilirubin 0.3 - 1.2 mg/dL 0.6  0.6    Alkaline Phos 38 - 126 U/L 83  89    AST 15 - 41 U/L 22  25    ALT 0 - 44 U/L 18  29       No results found for: "CEA1", "CEA" / No results found for: "CEA1", "CEA" No results found for: "PSA1" No results found for: "ION629" No results found for: "CAN125"  No results found for: "TOTALPROTELP", "ALBUMINELP", "A1GS", "A2GS", "BETS", "BETA2SER", "GAMS",  "MSPIKE", "SPEI" No results found for: "TIBC", "FERRITIN", "IRONPCTSAT" No results found for: "LDH"   STUDIES:   MM DIAG BREAST TOMO BILATERAL  Result Date: 09/29/2023 CLINICAL DATA:  57 year old female with history left breast cancer post lumpectomy January 2023. EXAM: DIGITAL DIAGNOSTIC BILATERAL MAMMOGRAM WITH TOMOSYNTHESIS AND CAD TECHNIQUE: Bilateral digital diagnostic mammography and breast tomosynthesis was performed. The images were evaluated with computer-aided detection. COMPARISON:  Previous exam(s). ACR Breast Density Category b: There are scattered areas of fibroglandular density. FINDINGS: No suspicious masses or calcifications are seen in either breast. Lumpectomy changes again identified in the central slightly upper left breast. Spot compression magnification CC view of the left breast lumpectomy site was performed. There is no mammographic evidence of locally recurrent malignancy. IMPRESSION: Stable lumpectomy changes in the left breast. No mammographic evidence of malignancy in either breast. RECOMMENDATION: Diagnostic mammogram is suggested in 1 year. (Code:DM-B-01Y) I have discussed the findings and recommendations with the patient. If applicable, a reminder letter will be sent to the patient regarding the next appointment. BI-RADS CATEGORY  2: Benign. Electronically Signed   By: Edwin Cap M.D.   On: 09/29/2023 14:50

## 2023-10-28 ENCOUNTER — Other Ambulatory Visit: Payer: Self-pay

## 2023-10-28 DIAGNOSIS — R7989 Other specified abnormal findings of blood chemistry: Secondary | ICD-10-CM

## 2023-10-28 MED ORDER — VITAMIN D 25 MCG (1000 UNIT) PO TABS
1000.0000 [IU] | ORAL_TABLET | Freq: Every day | ORAL | 6 refills | Status: DC
Start: 2023-10-28 — End: 2023-11-04

## 2023-11-04 ENCOUNTER — Other Ambulatory Visit: Payer: Self-pay | Admitting: *Deleted

## 2023-12-21 ENCOUNTER — Other Ambulatory Visit: Payer: Self-pay | Admitting: Family Medicine

## 2024-01-07 ENCOUNTER — Other Ambulatory Visit: Payer: Self-pay | Admitting: Family Medicine

## 2024-01-07 MED ORDER — LISINOPRIL-HYDROCHLOROTHIAZIDE 20-12.5 MG PO TABS: 1.0000 | ORAL_TABLET | Freq: Every day | ORAL | 2 refills | Status: DC

## 2024-01-29 DIAGNOSIS — T07XXXA Unspecified multiple injuries, initial encounter: Secondary | ICD-10-CM | POA: Diagnosis not present

## 2024-04-15 ENCOUNTER — Encounter: Payer: Self-pay | Admitting: Nurse Practitioner

## 2024-04-15 ENCOUNTER — Ambulatory Visit: Admitting: Nurse Practitioner

## 2024-04-15 VITALS — BP 120/81 | HR 68 | Temp 98.1°F | Ht 61.0 in | Wt 254.4 lb

## 2024-04-15 DIAGNOSIS — R5383 Other fatigue: Secondary | ICD-10-CM | POA: Diagnosis not present

## 2024-04-15 DIAGNOSIS — I10 Essential (primary) hypertension: Secondary | ICD-10-CM | POA: Diagnosis not present

## 2024-04-15 DIAGNOSIS — E559 Vitamin D deficiency, unspecified: Secondary | ICD-10-CM

## 2024-04-15 DIAGNOSIS — D649 Anemia, unspecified: Secondary | ICD-10-CM

## 2024-04-15 DIAGNOSIS — M797 Fibromyalgia: Secondary | ICD-10-CM

## 2024-04-15 DIAGNOSIS — Z1159 Encounter for screening for other viral diseases: Secondary | ICD-10-CM

## 2024-04-15 DIAGNOSIS — Z114 Encounter for screening for human immunodeficiency virus [HIV]: Secondary | ICD-10-CM

## 2024-04-15 MED ORDER — IBUPROFEN 600 MG PO TABS: 600.0000 mg | ORAL_TABLET | Freq: Three times a day (TID) | ORAL | 2 refills | Status: DC | PRN

## 2024-04-15 MED ORDER — AMITRIPTYLINE HCL 10 MG PO TABS: 10.0000 mg | ORAL_TABLET | Freq: Every day | ORAL | 0 refills | Status: DC

## 2024-04-15 NOTE — Progress Notes (Signed)
 Subjective:    Patient ID: Tammy Calderon, female    DOB: [birth year], 58 y.o.   MRN: [id number]  HPI Presents for routine follow-up on her blood pressure.  Currently on Zestoretic  20/12.5 daily.  Adherent to medication regimen.  Has occasional random mild dizziness.  No syncopal episodes.  Also has fibromyalgia.  Had some difficulty taking duloxetine  in the past due to excessive drowsiness.  States this is a common problem with many medications.  Has been struggling with depression and her fibromyalgia pain.  Difficulty sleeping due to pain in her muscles.  Experiencing extreme fatigue.  Denies suicidal thoughts or ideation.   Review of Systems  Constitutional:  Positive for fatigue. Negative for fever.  HENT:  Negative for sore throat and trouble swallowing.   Respiratory:  Negative for cough, chest tightness and shortness of breath.   Cardiovascular:  Negative for chest pain and leg swelling.  Gastrointestinal:  Positive for nausea. Negative for abdominal pain, blood in stool, constipation, diarrhea and vomiting.  Genitourinary:  Positive for frequency. Negative for dysuria and urgency.       History of urinary frequency especially when she takes her blood pressure pill.  Neurological:  Negative for facial asymmetry, speech difficulty, weakness and numbness.      04/15/2024   11:00 AM  Depression screen PHQ 2/9  Decreased Interest 3  Down, Depressed, Hopeless 2  PHQ - 2 Score 5  Altered sleeping 2  Tired, decreased energy 3  Change in appetite 2  Feeling bad or failure about yourself  2  Trouble concentrating 2  Moving slowly or fidgety/restless 3  Suicidal thoughts 0  PHQ-9 Score 19  Difficult doing work/chores Extremely dIfficult      04/15/2024   11:00 AM 03/09/2023    4:30 PM 02/12/2023    9:54 AM 01/01/2023   10:07 AM  GAD 7 : Generalized Anxiety Score  Nervous, Anxious, on Edge 2 3 3 3   Control/stop worrying 1 2 2 3   Worry too much - different things 2 3 2 3   Trouble  relaxing 0 3 2 2   Restless 0 2 1 1   Easily annoyed or irritable 1 3 2 2   Afraid - awful might happen 2 1 3 3   Total GAD 7 Score 8 17 15 17   Anxiety Difficulty Somewhat difficult Extremely difficult Extremely difficult Extremely difficult    Social History   Tobacco Use   Smoking status: Never    Passive exposure: Never   Smokeless tobacco: Never  Vaping Use   Vaping status: Never Used  Substance Use Topics   Alcohol use: No   Drug use: Never        Objective:   Physical Exam NAD.  Alert, oriented.  Calm cheerful affect.  Making good eye contact.  Speech clear.  Thoughts logical coherent and relevant.  Dressed appropriately for the weather.  Normal judgment and behavior.  Thyroid nontender to palpation, no mass or goiter noted.  Lungs clear.  Heart regular rate rhythm.  Abdomen soft nondistended nontender. Today's Vitals   04/15/24 1052  BP: 120/81  Pulse: 68  Temp: 98.1 F (36.7 C)  SpO2: 96%  Weight: 254 lb 6.4 oz (115.4 kg)  Height: 5\' 1"  (1.549 m)   Body mass index is 48.07 kg/m.  Orthostatic Vitals for the past 48 hrs (Last 6 readings):  Patient Position Orthostatic BP BP Pulse  04/15/24 1052 -- -- 120/81 68  04/15/24 1147 Supine 118/82 -- --  04/15/24 1148  Sitting 124/80 -- --  04/15/24 1149 Standing 130/82 -- --         Assessment & Plan:   Problem List Items Addressed This Visit       Cardiovascular and Mediastinum   Hypertension   Relevant Orders   Comprehensive metabolic panel with GFR   Lipid panel     Other   Anemia   Relevant Orders   CBC with Differential/Platelet   Iron, TIBC and Ferritin Panel   Fatigue   Relevant Orders   CBC with Differential/Platelet   Comprehensive metabolic panel with GFR   T4, free   TSH   Fibromyalgia - Primary   Relevant Medications   amitriptyline (ELAVIL) 10 MG tablet   ibuprofen  (ADVIL ) 600 MG tablet   Vitamin D  deficiency   Relevant Orders   VITAMIN D  25 Hydroxy (Vit-D Deficiency, Fractures)    Other Visit Diagnoses       Screening for HIV (human immunodeficiency virus)       Relevant Orders   HIV Antibody (routine testing w rflx)     Encounter for hepatitis C screening test for low risk patient       Relevant Orders   Hepatitis C antibody      Patient has had a history of anemia.  Labs ordered including an iron panel.  Also has a history of vitamin D  deficiency, will recheck this as well. Meds ordered this encounter  Medications   amitriptyline (ELAVIL) 10 MG tablet    Sig: Take 1 tablet (10 mg total) by mouth at bedtime.    Dispense:  30 tablet    Refill:  0    Supervising Provider:   Charlotta Cook A [[id number]]   ibuprofen  (ADVIL ) 600 MG tablet    Sig: Take 1 tablet (600 mg total) by mouth every 8 (eight) hours as needed.    Dispense:  40 tablet    Refill:  2    Supervising Provider:   Charlotta Cook A [[id number]]   Refill ibuprofen  that she takes occasionally for fibromyalgia pain. Discussed options for treating fibromyalgia.  Trial of low-dose amitriptyline for fibromyalgia pain with hopefully some improvement in her depression symptoms.  Reviewed potential adverse effects.  Discontinue medication and contact office if any problems. Return in about 1 month (around 05/15/2024).

## 2024-04-16 ENCOUNTER — Encounter: Payer: Self-pay | Admitting: Nurse Practitioner

## 2024-04-16 ENCOUNTER — Other Ambulatory Visit: Payer: Self-pay | Admitting: Nurse Practitioner

## 2024-04-16 LAB — COMPREHENSIVE METABOLIC PANEL WITH GFR
ALT: 28 IU/L (ref 0–32)
AST: 36 IU/L (ref 0–40)
Albumin: 4.4 g/dL (ref 3.8–4.9)
Alkaline Phosphatase: 148 IU/L — ABNORMAL HIGH (ref 44–121)
BUN/Creatinine Ratio: 25 — ABNORMAL HIGH (ref 9–23)
BUN: 17 mg/dL (ref 6–24)
Bilirubin Total: 0.3 mg/dL (ref 0.0–1.2)
CO2: 24 mmol/L (ref 20–29)
Calcium: 9.6 mg/dL (ref 8.7–10.2)
Chloride: 99 mmol/L (ref 96–106)
Creatinine, Ser: 0.69 mg/dL (ref 0.57–1.00)
Globulin, Total: 2.5 g/dL (ref 1.5–4.5)
Glucose: 89 mg/dL (ref 70–99)
Potassium: 4.1 mmol/L (ref 3.5–5.2)
Sodium: 138 mmol/L (ref 134–144)
Total Protein: 6.9 g/dL (ref 6.0–8.5)
eGFR: 102 mL/min/{1.73_m2} (ref >59)

## 2024-04-16 LAB — CBC WITH DIFFERENTIAL/PLATELET
Basophils Absolute: 0.1 10*3/uL (ref 0.0–0.2)
Basos: 1 %
EOS (ABSOLUTE): 0.3 10*3/uL (ref 0.0–0.4)
Eos: 5 %
Hematocrit: 36 % (ref 34.0–46.6)
Hemoglobin: 11.6 g/dL (ref 11.1–15.9)
Immature Grans (Abs): 0.1 10*3/uL (ref 0.0–0.1)
Immature Granulocytes: 1 %
Lymphocytes Absolute: 1 10*3/uL (ref 0.7–3.1)
Lymphs: 14 %
MCH: 28.4 pg (ref 26.6–33.0)
MCHC: 32.2 g/dL (ref 31.5–35.7)
MCV: 88 fL (ref 79–97)
Monocytes Absolute: 0.4 10*3/uL (ref 0.1–0.9)
Monocytes: 6 %
Neutrophils Absolute: 5.1 10*3/uL (ref 1.4–7.0)
Neutrophils: 73 %
Platelets: 226 10*3/uL (ref 150–450)
RBC: 4.08 x10E6/uL (ref 3.77–5.28)
RDW: 12.9 % (ref 11.7–15.4)
WBC: 7 10*3/uL (ref 3.4–10.8)

## 2024-04-16 LAB — LIPID PANEL
Chol/HDL Ratio: 4.7 ratio — ABNORMAL HIGH (ref 0.0–4.4)
Cholesterol, Total: 281 mg/dL — ABNORMAL HIGH (ref 100–199)
HDL: 60 mg/dL (ref >39)
LDL Chol Calc (NIH): 183 mg/dL — ABNORMAL HIGH (ref 0–99)
Triglycerides: 204 mg/dL — ABNORMAL HIGH (ref 0–149)
VLDL Cholesterol Cal: 38 mg/dL (ref 5–40)

## 2024-04-16 LAB — IRON,TIBC AND FERRITIN PANEL
Ferritin: 80 ng/mL (ref 15–150)
Iron Saturation: 19 % (ref 15–55)
Iron: 69 ug/dL (ref 27–159)
Total Iron Binding Capacity: 366 ug/dL (ref 250–450)
UIBC: 297 ug/dL (ref 131–425)

## 2024-04-16 LAB — VITAMIN D 25 HYDROXY (VIT D DEFICIENCY, FRACTURES): Vit D, 25-Hydroxy: 21.6 ng/mL — ABNORMAL LOW (ref 30.0–100.0)

## 2024-04-16 LAB — HIV ANTIBODY (ROUTINE TESTING W REFLEX): HIV Screen 4th Generation wRfx: NONREACTIVE

## 2024-04-16 LAB — TSH: TSH: 2.64 u[IU]/mL (ref 0.450–4.500)

## 2024-04-16 LAB — HEPATITIS C ANTIBODY: Hep C Virus Ab: NONREACTIVE

## 2024-04-16 LAB — T4, FREE: Free T4: 0.95 ng/dL (ref 0.82–1.77)

## 2024-04-16 MED ORDER — VITAMIN D (ERGOCALCIFEROL) 1.25 MG (50000 UNIT) PO CAPS: 50000.0000 [IU] | ORAL_CAPSULE | ORAL | 0 refills | Status: DC

## 2024-04-18 ENCOUNTER — Other Ambulatory Visit: Payer: Self-pay | Admitting: Nurse Practitioner

## 2024-04-18 DIAGNOSIS — E785 Hyperlipidemia, unspecified: Secondary | ICD-10-CM | POA: Insufficient documentation

## 2024-04-18 DIAGNOSIS — Z79899 Other long term (current) drug therapy: Secondary | ICD-10-CM

## 2024-04-18 MED ORDER — ROSUVASTATIN CALCIUM 10 MG PO TABS: 10.0000 mg | ORAL_TABLET | Freq: Every day | ORAL | 0 refills | Status: DC

## 2024-05-02 ENCOUNTER — Encounter: Payer: Self-pay | Admitting: Nurse Practitioner

## 2024-05-03 ENCOUNTER — Other Ambulatory Visit: Payer: Self-pay | Admitting: Nurse Practitioner

## 2024-05-03 MED ORDER — PRAVASTATIN SODIUM 40 MG PO TABS: 40.0000 mg | ORAL_TABLET | Freq: Every day | ORAL | 0 refills | Status: DC

## 2024-05-03 MED ORDER — HYDROCHLOROTHIAZIDE 12.5 MG PO CAPS: 12.5000 mg | ORAL_CAPSULE | Freq: Every day | ORAL | 0 refills | Status: DC

## 2024-05-12 ENCOUNTER — Ambulatory Visit: Admitting: Family Medicine

## 2024-05-17 ENCOUNTER — Ambulatory Visit: Admitting: Nurse Practitioner

## 2024-05-26 ENCOUNTER — Ambulatory Visit: Admitting: Nurse Practitioner

## 2024-05-31 ENCOUNTER — Encounter: Payer: Self-pay | Admitting: Nurse Practitioner

## 2024-05-31 ENCOUNTER — Ambulatory Visit: Admitting: Nurse Practitioner

## 2024-05-31 ENCOUNTER — Ambulatory Visit: Payer: Self-pay

## 2024-05-31 VITALS — BP 122/74 | HR 68 | Temp 98.1°F | Ht 61.0 in | Wt 251.2 lb

## 2024-05-31 DIAGNOSIS — B029 Zoster without complications: Secondary | ICD-10-CM | POA: Diagnosis not present

## 2024-05-31 MED ORDER — VALACYCLOVIR HCL 1 G PO TABS: 1000.0000 mg | ORAL_TABLET | Freq: Three times a day (TID) | ORAL | 0 refills | Status: DC

## 2024-05-31 MED ORDER — AMITRIPTYLINE HCL 25 MG PO TABS: 25.0000 mg | ORAL_TABLET | Freq: Every day | ORAL | 0 refills | Status: DC

## 2024-05-31 NOTE — Patient Instructions (Signed)
 Capsaicin cream   Shingles  Shingles, or herpes zoster, is an infection. It gives you a skin rash and blisters. These infected areas may hurt a lot. Shingles only happens if: You've had chickenpox. You've been given a shot called a vaccine to protect you from getting chickenpox. Shingles is rare in this case. What are the causes? Shingles is caused by a germ called the varicella-zoster virus. This is the same germ that causes chickenpox. After you're exposed to the germ, it stays in your body but is dormant. This means it isn't active. Shingles happens if the germ becomes active again. This can happen years after you're first exposed to the germ. What increases the risk? You may be more likely to get shingles if: You're older than 56 years of age. You're under a lot of stress. You have a weak immune system. The immune system is your body's defense system. It may be weak if: You have human immunodeficiency virus (HIV). You have acquired immunodeficiency syndrome (AIDS). You have cancer. You take medicines that weaken your immune system. These include organ transplant medicines. What are the signs or symptoms? The first symptoms of shingles may be itching, tingling, or pain. Your skin may feel like it's burning. A few days or weeks later, you'll get a rash. Here's what you can expect: The rash is likely to be on one side of your body. The rash may be shaped like a belt or a band. Over time, it will turn into blisters filled with fluid. The blisters will break open and change into scabs. The scabs will dry up in about 2-3 weeks. You may also have: A fever. Chills. A headache. Nausea. How is this diagnosed? Shingles is diagnosed with a skin exam. A sample called a culture may be taken from one of your blisters and sent to a lab. This will show if you have shingles. How is this treated? The rash may last for several weeks. There's no cure for shingles, but your health care provider may  give you medicines. These medicines may: Help with pain. Help with itching. Help with irritation and swelling. Help you get better sooner. Help to prevent long-term problems. If the rash is on your face, you may need to see an eye doctor or an ear, nose, and throat (ENT) doctor. Follow these instructions at home: Medicines Take your medicines only as told by your provider. Put an anti-itch cream or numbing cream on the rash or blisters as told by your provider. Relieving itching and discomfort  To help with itching: Put cold, wet cloths called cold compresses on the rash or blisters. Take a cool bath. Try adding baking soda or dry oatmeal to the water . Do not bathe in hot water . Use calamine lotion on the rash or blisters. You can get this type of lotion at the store. Blister and rash care Keep your rash covered with a loose bandage. Wear loose clothes that don't rub on your rash. Take care of your rash as told by your provider. Make sure you: Wash your hands with soap and water  for at least 20 seconds before and after you change your bandage. If you can't use soap and water , use hand sanitizer. Keep your rash and blisters clean by washing them with mild soap and cool water . Change your bandage. Check your rash every day for signs of infection. Check for: More redness, swelling, or pain. Fluid or blood. Warmth. Pus or a bad smell. Do not scratch your rash. Do  not pick at your blisters. To help you not scratch: Keep your fingernails clean and cut short. Try to wear gloves or mittens when you sleep. General instructions Rest. Wash your hands often with soap and water  for at least 20 seconds. If you can't use soap and water , use hand sanitizer. Washing your hands lowers your chance of getting a skin infection. Your infection can cause chickenpox in others. If you have blisters that aren't scabs yet, stay away from: Babies. Pregnant people. Children who have eczema. Older people  who have organ transplants. People who have a long-term, or chronic, illness. Anyone who hasn't had chickenpox before. Anyone who hasn't gotten the chickenpox vaccine. How is this prevented? Vaccines are the best way to prevent you from getting chickenpox or shingles. Talk with your provider about getting these shots. Where to find more information Centers for Disease Control and Prevention (CDC): TonerProm[employer].no Contact a health care provider if: Your pain doesn't get better with medicine. Your pain doesn't get better after the rash heals. You have any signs of infection around the rash. Your rash or blisters get worse. You have a fever or chills. Get help right away if: The rash is on your face or nose. You have pain in your face or by your eye. You lose feeling on one side of your face. You have trouble seeing. You have ear pain or ringing in your ear. This information is not intended to replace advice given to you by your health care provider. Make sure you discuss any questions you have with your health care provider. Document Revised: 09/10/2023 Document Reviewed: 01/23/2023 Elsevier Patient Education  2024 [employer].

## 2024-05-31 NOTE — Progress Notes (Signed)
   Subjective:    Patient ID: Tammy Calderon, female    DOB: [birth year], 58 y.o.   MRN: [id number]  HPI Presents for complaints of "a bug bite" on the right side of her neck first noticed 3 days ago.  Very painful.  Also feels like there is something on her right ear which has been sore.  Has tried antibiotic ointment, topical steroid, charcoal and red clay with no relief.   Review of Systems  Constitutional:  Positive for fatigue. Negative for chills and fever.  Respiratory:  Negative for cough, chest tightness, shortness of breath and wheezing.   Cardiovascular:  Negative for chest pain.  Skin:  Positive for rash.       Objective:   Physical Exam NAD.  Alert, oriented.  Pharynx clear and moist.  Lungs clear.  Heart regular rate rhythm.  Small cluster of tiny vesicles on a mildly erythematous base with several discrete papules noted along the dermatomal line on the right side of the neck.  Top of the tragus is mildly erythematous.  The entire area is tender to palpation.  The right ear canal minimal erythema, no lesions noted. Today's Vitals   05/31/24 1104  BP: 122/74  Pulse: 68  Temp: 98.1 F (36.7 C)  SpO2: 97%  Weight: 251 lb 3.2 oz (113.9 kg)  Height: 5\' 1"  (1.549 m)   Body mass index is 47.46 kg/m.        Assessment & Plan:   Problem List Items Addressed This Visit       Nervous and Auditory   Herpes zoster without complication - Primary   Relevant Medications   valACYclovir (VALTREX) 1000 MG tablet   Meds ordered this encounter  Medications   valACYclovir (VALTREX) 1000 MG tablet    Sig: Take 1 tablet (1,000 mg total) by mouth 3 (three) times daily. X 7 days    Dispense:  21 tablet    Refill:  0    Supervising Provider:   Charlotta Cook A [[id number]]   amitriptyline  (ELAVIL ) 25 MG tablet    Sig: Take 1 tablet (25 mg total) by mouth at bedtime.    Dispense:  90 tablet    Refill:  0    Supervising Provider:   Charlotta Cook A [[id number]]   Start valacyclovir today as  soon as possible.  Given written and verbal information on shingles including warning signs and pain control. Since she is already on amitriptyline , will increase to 25 mg to help with any neuropathic pain.  May need to switch to gabapentin  for a while if any persistent postherpetic neuralgia. Once she is fully healed, recommend shingles vaccine. Call back if symptoms worsen or persist.

## 2024-05-31 NOTE — Telephone Encounter (Signed)
   FYI Only or Action Required?: FYI only for provider  Patient was last seen in primary care on 04/15/2024 by Derenda Flax, NP. Called Nurse Triage reporting Insect Bite. Symptoms began several days ago. Interventions attempted: OTC medications: cortisone creams, PO tylenol . Symptoms are: gradually worsening.  Triage Disposition: See Physician Within 24 Hours  Patient/caregiver understands and will follow disposition?: YesCopied from CRM [id number]. Topic: Clinical - Red Word Triage >> May 31, 2024  9:43 AM Kevelyn M wrote: Red Word that prompted transfer to Nurse Triage: Spider bite the middle right side of neck possible/3 days really painful. It's swollen and red and also soreness on up ear lobe. Reason for Disposition  [1] Red or very tender (to touch) area AND [2] started over 24 hours after the bite  Answer Assessment - Initial Assessment Questions 1. TYPE of INSECT: "What type of insect was it?"      Not sure, two separate bites 2. ONSET: "When did you get bitten?"      Possibly a couple of days ago 3. LOCATION: "Where is the insect bite located?"      Right front of neck and earlobe 4. REDNESS: "Is the area red or pink?" If Yes, ask: "What size is area of redness?" (inches or cm). "When did the redness start?"     Dime sized, a couple of days ago 5. PAIN: "Is there any pain?" If Yes, ask: "How bad is it?"  (Scale 1-10; or mild, moderate, severe)     5 6. ITCHING: "Does it itch?" If Yes, ask: "How bad is the itch?"    - MILD: doesn't interfere with normal activities   - MODERATE-SEVERE: interferes with work, school, sleep, or other activities      no 7. SWELLING: "How big is the swelling?" (inches, cm, or compare to coins)     Neck is swollen 8. OTHER SYMPTOMS: "Do you have any other symptoms?"  (e.g., difficulty breathing, hives)    denies fever, difficulty breathing, or hives, but notices smaller bumps around larger bite on neck  Protocols used: Insect Bite-A-AH

## 2024-06-13 ENCOUNTER — Encounter: Payer: Self-pay | Admitting: Nurse Practitioner

## 2024-09-21 ENCOUNTER — Other Ambulatory Visit (HOSPITAL_COMMUNITY): Payer: Self-pay | Admitting: Oncology

## 2024-09-21 DIAGNOSIS — D0512 Intraductal carcinoma in situ of left breast: Secondary | ICD-10-CM

## 2024-09-21 DIAGNOSIS — Z9889 Other specified postprocedural states: Secondary | ICD-10-CM

## 2024-10-03 ENCOUNTER — Other Ambulatory Visit: Payer: Self-pay

## 2024-10-03 DIAGNOSIS — D0512 Intraductal carcinoma in situ of left breast: Secondary | ICD-10-CM

## 2024-10-03 DIAGNOSIS — R7989 Other specified abnormal findings of blood chemistry: Secondary | ICD-10-CM

## 2024-10-04 ENCOUNTER — Inpatient Hospital Stay: Payer: Medicaid Other | Attending: Oncology

## 2024-10-04 ENCOUNTER — Ambulatory Visit (HOSPITAL_COMMUNITY)
Admission: RE | Admit: 2024-10-04 | Discharge: 2024-10-04 | Disposition: A | Discharge status: Home / Self Care | Payer: Medicaid Other | Source: Ambulatory Visit | Attending: Hematology | Admitting: Hematology

## 2024-10-04 ENCOUNTER — Other Ambulatory Visit (HOSPITAL_COMMUNITY): Payer: Self-pay | Admitting: Oncology

## 2024-10-04 DIAGNOSIS — Z8049 Family history of malignant neoplasm of other genital organs: Secondary | ICD-10-CM | POA: Diagnosis not present

## 2024-10-04 DIAGNOSIS — E559 Vitamin D deficiency, unspecified: Secondary | ICD-10-CM | POA: Insufficient documentation

## 2024-10-04 DIAGNOSIS — D0512 Intraductal carcinoma in situ of left breast: Secondary | ICD-10-CM | POA: Insufficient documentation

## 2024-10-04 DIAGNOSIS — R928 Other abnormal and inconclusive findings on diagnostic imaging of breast: Secondary | ICD-10-CM

## 2024-10-04 DIAGNOSIS — Z803 Family history of malignant neoplasm of breast: Secondary | ICD-10-CM | POA: Insufficient documentation

## 2024-10-04 DIAGNOSIS — Z9889 Other specified postprocedural states: Secondary | ICD-10-CM

## 2024-10-04 DIAGNOSIS — Z923 Personal history of irradiation: Secondary | ICD-10-CM | POA: Diagnosis not present

## 2024-10-04 DIAGNOSIS — Z86000 Personal history of in-situ neoplasm of breast: Secondary | ICD-10-CM | POA: Diagnosis present

## 2024-10-04 DIAGNOSIS — J45909 Unspecified asthma, uncomplicated: Secondary | ICD-10-CM | POA: Insufficient documentation

## 2024-10-04 DIAGNOSIS — R7989 Other specified abnormal findings of blood chemistry: Secondary | ICD-10-CM

## 2024-10-04 LAB — VITAMIN D 25 HYDROXY (VIT D DEFICIENCY, FRACTURES): Vit D, 25-Hydroxy: 30.05 ng/mL (ref 30–100)

## 2024-10-04 LAB — COMPREHENSIVE METABOLIC PANEL WITH GFR
ALT: 22 U/L (ref 0–44)
AST: 27 U/L (ref 15–41)
Albumin: 4.3 g/dL (ref 3.5–5.0)
Alkaline Phosphatase: 104 U/L (ref 38–126)
Anion gap: 11 (ref 5–15)
BUN: 17 mg/dL (ref 6–20)
CO2: 25 mmol/L (ref 22–32)
Calcium: 10.1 mg/dL (ref 8.9–10.3)
Chloride: 101 mmol/L (ref 98–111)
Creatinine, Ser: 0.9 mg/dL (ref 0.44–1.00)
GFR, Estimated: >60 mL/min (ref >60)
Glucose, Bld: 96 mg/dL (ref 70–99)
Potassium: 4.2 mmol/L (ref 3.5–5.1)
Sodium: 137 mmol/L (ref 135–145)
Total Bilirubin: 0.3 mg/dL (ref 0.0–1.2)
Total Protein: 7.2 g/dL (ref 6.5–8.1)

## 2024-10-04 LAB — CBC WITH DIFFERENTIAL/PLATELET
Abs Immature Granulocytes: 0.04 K/uL (ref 0.00–0.07)
Basophils Absolute: 0.1 K/uL (ref 0.0–0.1)
Basophils Relative: 2 %
Eosinophils Absolute: 0.4 K/uL (ref 0.0–0.5)
Eosinophils Relative: 6 %
HCT: 35.9 % — ABNORMAL LOW (ref 36.0–46.0)
Hemoglobin: 11.7 g/dL — ABNORMAL LOW (ref 12.0–15.0)
Immature Granulocytes: 1 %
Lymphocytes Relative: 19 %
Lymphs Abs: 1.2 K/uL (ref 0.7–4.0)
MCH: 30 pg (ref 26.0–34.0)
MCHC: 32.6 g/dL (ref 30.0–36.0)
MCV: 92.1 fL (ref 80.0–100.0)
Monocytes Absolute: 0.4 K/uL (ref 0.1–1.0)
Monocytes Relative: 6 %
Neutro Abs: 4.5 K/uL (ref 1.7–7.7)
Neutrophils Relative %: 66 %
Platelets: 257 K/uL (ref 150–400)
RBC: 3.9 MIL/uL (ref 3.87–5.11)
RDW: 13.4 % (ref 11.5–15.5)
WBC: 6.7 K/uL (ref 4.0–10.5)
nRBC: 0 % (ref 0.0–0.2)

## 2024-10-05 ENCOUNTER — Other Ambulatory Visit (HOSPITAL_COMMUNITY): Payer: Self-pay | Admitting: Oncology

## 2024-10-05 DIAGNOSIS — R928 Other abnormal and inconclusive findings on diagnostic imaging of breast: Secondary | ICD-10-CM

## 2024-10-06 ENCOUNTER — Ambulatory Visit (HOSPITAL_COMMUNITY)
Admission: RE | Admit: 2024-10-06 | Discharge: 2024-10-06 | Disposition: A | Discharge status: Home / Self Care | Source: Ambulatory Visit | Attending: Oncology | Admitting: Oncology

## 2024-10-06 ENCOUNTER — Encounter (HOSPITAL_COMMUNITY): Payer: Self-pay

## 2024-10-06 ENCOUNTER — Other Ambulatory Visit (HOSPITAL_COMMUNITY): Payer: Self-pay | Admitting: Oncology

## 2024-10-06 DIAGNOSIS — R928 Other abnormal and inconclusive findings on diagnostic imaging of breast: Secondary | ICD-10-CM | POA: Insufficient documentation

## 2024-10-06 DIAGNOSIS — Z1239 Encounter for other screening for malignant neoplasm of breast: Secondary | ICD-10-CM | POA: Insufficient documentation

## 2024-10-06 DIAGNOSIS — N6001 Solitary cyst of right breast: Secondary | ICD-10-CM | POA: Diagnosis not present

## 2024-10-06 HISTORY — PX: BREAST CYST ASPIRATION: SHX578

## 2024-10-06 MED ORDER — LIDOCAINE HCL (PF) 2 % IJ SOLN
INTRAMUSCULAR | Status: AC
Start: 2024-10-06 — End: 2024-10-06
  Filled 2024-10-06: qty 10

## 2024-10-06 MED ORDER — LIDOCAINE-EPINEPHRINE (PF) 1 %-1:200000 IJ SOLN
10.0000 mL | Freq: Once | INTRAMUSCULAR | Status: AC
  Administered 2024-10-06: 10 mL via INTRADERMAL

## 2024-10-06 MED ORDER — LIDOCAINE HCL (PF) 2 % IJ SOLN
10.0000 mL | Freq: Once | INTRAMUSCULAR | Status: AC
  Administered 2024-10-06: 10 mL

## 2024-10-11 ENCOUNTER — Inpatient Hospital Stay: Payer: Medicaid Other | Admitting: Oncology

## 2024-10-11 ENCOUNTER — Other Ambulatory Visit (HOSPITAL_COMMUNITY): Payer: Self-pay | Admitting: Oncology

## 2024-10-11 VITALS — BP 120/71 | HR 68 | Temp 97.9°F | Resp 18 | Ht 60.0 in | Wt 257.0 lb

## 2024-10-11 DIAGNOSIS — R928 Other abnormal and inconclusive findings on diagnostic imaging of breast: Secondary | ICD-10-CM

## 2024-10-11 DIAGNOSIS — E559 Vitamin D deficiency, unspecified: Secondary | ICD-10-CM | POA: Diagnosis not present

## 2024-10-11 DIAGNOSIS — Z803 Family history of malignant neoplasm of breast: Secondary | ICD-10-CM

## 2024-10-11 DIAGNOSIS — Z86000 Personal history of in-situ neoplasm of breast: Secondary | ICD-10-CM | POA: Diagnosis not present

## 2024-10-11 DIAGNOSIS — D0512 Intraductal carcinoma in situ of left breast: Secondary | ICD-10-CM | POA: Diagnosis not present

## 2024-10-11 NOTE — Progress Notes (Signed)
 [facility] OFFICE PROGRESS NOTE  Tammy Calderon LABOR, MD  ASSESSMENT & PLAN:    Assessment & Plan Ductal carcinoma in situ (DCIS) of left breast She could not tolerate anastrozole  due to panic attacks. Mammogram from 10/04/2024 showed indeterminate mass of the right breast.  She had ultrasound-guided biopsy that turned into aspiration because only fluid was present.  Left breast negative. Recommends follow-up with repeat mammogram in 6 months.  Orders placed today. Labs from 10/04/2024 show hemoglobin of 11.7 normal CMP. Patient denies any other breast concerns. Vitamin D  deficiency She is taking multivitamin with calcium  and vitamin D  in it.  Vitamin D  level is 30.05. DEXA scan was normal on 02/03/2022.  Repeat DEXA in February 2026. Family history of breast cancer Patient reports she was adopted but found out from family members that both her mother and one of her sisters had breast cancer and had both passed from this. Reports she is interested in genetic testing. Referral to genetics.  Orders Placed This Encounter  Procedures   MM 3D DIAGNOSTIC MAMMOGRAM UNILATERAL RIGHT BREAST    Standing Status:   Future    Expected Date:   04/11/2025    Expiration Date:   10/11/2025    Reason for Exam (SYMPTOM  OR DIAGNOSIS REQUIRED):   post biopsy    Is the patient pregnant?:   No    Preferred imaging location?:   [facility]   DG Bone Density    Standing Status:   Future    Expected Date:   04/11/2025    Expiration Date:   10/11/2025    Reason for Exam (SYMPTOM  OR DIAGNOSIS REQUIRED):   surveillence    Is patient pregnant?:   No    Preferred imaging location?:   [facility]    Release to patient:   Immediate   Ambulatory referral to Genetics    Referral Priority:   Routine    Referral Type:   Consultation    Referral Reason:   Specialty Services Required    Number of Visits Requested:   1    INTERVAL HISTORY: Patient returns for follow-up for breast  DCIS.  She is currently not on antihormone medication due to intolerance.   She recently had a mammogram on 10/04/2024 which showed an indeterminate 0.4 cm right breast mass corresponding to the mammographic mass.  Postlumpectomy changes of the left breast again seen.  No evidence of left breast malignancy.  Recommended ultrasound-guided biopsy of 0.4 cm right breast mass.  Breast mass appeared to be a cyst and was aspirated.  No pathology was sent off.  Mammogram of right breast showed probable benign right upper outer quadrant asymmetry despite aspiration of 0.4 cm right breast cyst.  Right diagnostic mammogram in 6 months.   She reports occasional pain in her right breast.  Left breast is mostly fine.  Has shortness of breath secondary to asthma.  Has low energy levels 25% appetite is 100%.  Has trouble sleeping at times.  Reports she recently found out that both her mother and one of her sisters died of breast cancer.  No other cancers that she is aware of.  Denies any interval hospitalizations, surgeries or changes to baseline health.  We reviewed CBC, CMP and vitamin D .  SUMMARY OF HEMATOLOGIC HISTORY: Oncology History Overview Note  Left breast DCIS: - Abnormal screening mammogram on 09/19/2021. - Left breast biopsy 12:30 mass-ALH, 12:00 mass-ALH, 9:00 mass fibroadenoma - Left breast lumpectomy on  12/27/2021 - Pathology shows DCIS, grade 2, 0.15 cm, margins negative, atypical lobular hyperplasia.  ER shows weak expression and DCIS.  pTis, pNX       -Completed XRT to the left breast around March 2023       - She recently underwent hysterectomy.  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  Does not require any adjuvant therapy.   Social/family history: - Lives at home with her husband.  She works at [employer].  Non-smoker. - She is adopted.  Biological mother died of uterine cancer.  MMR IHC intact MS stable   Endometrial cancer (HCC)   02/17/2022 Initial Biopsy   EMB: Endometrioid carcinoma, FIGO grade 1.  Prolapsing endocervical mass-benign endocervical polyp.   02/28/2022 Imaging   Pelvic ultrasound shows abnormally thickened heterogenous endometrial complex measuring 18 mm, simple right ovarian cyst.   03/07/2022 Initial Diagnosis   Endometrial cancer (HCC)   03/19/2022 Surgery   TRH/BSO, bilateral sentinel lymph node biopsy  Findings: On EUA, small mobile uterus, no adnexal masses. On intra-abdominal entry, normal upper abdominal survey.  Normal-appearing omentum, small and large bowel.  Filmy adhesions of the appendix to the sigmoid epiploica and right pelvic sidewall.  Clips noted on the fallopian tube consistent with prior tubal ligation.  Normal-appearing bilateral adnexa.  Uterus 8 cm and somewhat bulbous at the fundus, otherwise normal in appearance.  Mapping successful bilaterally to obturator sentinel lymph nodes.  Mildly enlarged lymph node on the left with enlarged adjacent lymph node that was also removed.  No obvious extrauterine tumor.  Mild adhesions between the bladder and lower uterine segment/cervix.   03/19/2022 Pathology Results   Stage IA, grade 1 endometrioid adenocarcinoma MI 18% No LVI Negative SLNs (and adjacent LNs)      CBC    Component Value Date/Time   WBC 6.7 10/04/2024 1302   RBC 3.90 10/04/2024 1302   HGB 11.7 (L) 10/04/2024 1302   HGB 11.6 04/15/2024 1223   HCT 35.9 (L) 10/04/2024 1302   HCT 36.0 04/15/2024 1223   PLT 257 10/04/2024 1302   PLT 226 04/15/2024 1223   MCV 92.1 10/04/2024 1302   MCV 88 04/15/2024 1223   MCH 30.0 10/04/2024 1302   MCHC 32.6 10/04/2024 1302   RDW 13.4 10/04/2024 1302   RDW 12.9 04/15/2024 1223   LYMPHSABS 1.2 10/04/2024 1302   LYMPHSABS 1.0 04/15/2024 1223   MONOABS 0.4 10/04/2024 1302   EOSABS 0.4 10/04/2024 1302   EOSABS 0.3 04/15/2024 1223   BASOSABS 0.1 10/04/2024 1302   BASOSABS 0.1 04/15/2024 1223       Latest Ref Rng & Units  10/04/2024    1:02 PM 04/15/2024   12:23 PM 09/29/2023    1:11 PM  CMP  Glucose 70 - 99 mg/dL 96  89  95   BUN 6 - 20 mg/dL 17  17  19    Creatinine 0.44 - 1.00 mg/dL 9.09  9.30  9.21   Sodium 135 - 145 mmol/L 137  138  137   Potassium 3.5 - 5.1 mmol/L 4.2  4.1  3.7   Chloride 98 - 111 mmol/L 101  99  103   CO2 22 - 32 mmol/L 25  24  24    Calcium  8.9 - 10.3 mg/dL 89.8  9.6  9.3   Total Protein 6.5 - 8.1 g/dL 7.2  6.9  7.2   Total Bilirubin 0.0 - 1.2 mg/dL 0.3  0.3  0.6   Alkaline Phos  38 - 126 U/L 104  148  83   AST 15 - 41 U/L 27  36  22   ALT 0 - 44 U/L 22  28  18       Lab Results  Component Value Date   FERRITIN 80 04/15/2024    Vitals:   10/11/24 1005  BP: 120/71  Pulse: 68  Resp: 18  Temp: 97.9 F (36.6 C)  SpO2: 99%    Review of System:  Review of Systems  Constitutional:  Positive for malaise/fatigue.  Respiratory:  Positive for cough.   Neurological:  Positive for dizziness.  Psychiatric/Behavioral:  The patient has insomnia.     Physical Exam: Physical Exam Constitutional:      Appearance: Normal appearance.  HENT:     Head: Normocephalic and atraumatic.  Eyes:     Pupils: Pupils are equal, round, and reactive to light.  Cardiovascular:     Rate and Rhythm: Normal rate and regular rhythm.     Heart sounds: Normal heart sounds. No murmur heard. Pulmonary:     Effort: Pulmonary effort is normal.     Breath sounds: Normal breath sounds. No wheezing.  Chest:     Comments: Deferred secondary to recent mammogram Abdominal:     General: Bowel sounds are normal. There is no distension.     Palpations: Abdomen is soft.     Tenderness: There is no abdominal tenderness.  Musculoskeletal:        General: Normal range of motion.     Cervical back: Normal range of motion.  Skin:    General: Skin is warm and dry.     Findings: No rash.  Neurological:     Mental Status: She is alert and oriented to person, place, and time.     Gait: Gait is intact.   Psychiatric:        Mood and Affect: Mood and affect normal.        Cognition and Memory: Memory normal.        Judgment: Judgment normal.      I spent 25 minutes dedicated to the care of this patient (face-to-face and non-face-to-face) on the date of the encounter to include what is described in the assessment and plan.,  Delon Hope, NP 10/11/2024 2:05 PM

## 2024-10-11 NOTE — Assessment & Plan Note (Addendum)
 She could not tolerate anastrozole  due to panic attacks. Mammogram from 10/04/2024 showed indeterminate mass of the right breast.  She had ultrasound-guided biopsy that turned into aspiration because only fluid was present.  Left breast negative. Recommends follow-up with repeat mammogram in 6 months.  Orders placed today. Labs from 10/04/2024 show hemoglobin of 11.7 normal CMP. Patient denies any other breast concerns.

## 2024-10-11 NOTE — Assessment & Plan Note (Addendum)
 She is taking multivitamin with calcium  and vitamin D  in it.  Vitamin D  level is 30.05. DEXA scan was normal on 02/03/2022.  Repeat DEXA in February 2026.

## 2024-10-28 ENCOUNTER — Other Ambulatory Visit: Payer: Self-pay | Admitting: Nurse Practitioner

## 2024-11-03 ENCOUNTER — Inpatient Hospital Stay: Admitting: Licensed Clinical Social Worker

## 2024-11-08 ENCOUNTER — Inpatient Hospital Stay: Admitting: Licensed Clinical Social Worker

## 2024-11-28 ENCOUNTER — Ambulatory Visit: Admitting: Nurse Practitioner

## 2024-12-02 ENCOUNTER — Encounter: Payer: Self-pay | Admitting: *Deleted

## 2024-12-02 ENCOUNTER — Ambulatory Visit: Payer: Self-pay
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
## 2024-12-02 NOTE — Telephone Encounter (Signed)
 Patient calling back to schedule appt with PCP or other provider. Patient denies severe chest pain and no SOB. Reports she has worsening fatigue and requesting f/u appt next week. No available appt with PCP until March. Scheduled appt with other provider earliest 12/09/24. Reinforced to patient if she has worsening sx go to ED.  Please advise if earlier appt available .

## 2024-12-02 NOTE — Telephone Encounter (Signed)
 This encounter was created in error - please disregard.

## 2024-12-02 NOTE — Telephone Encounter (Signed)
 Staff please talk with patient She may have a same-day appointment if I have 1 if not with one of the other providers next week per patient request If she is having severe chest pain or shortness of breath it is our recommendation to go to  [facility]

## 2024-12-02 NOTE — Telephone Encounter (Signed)
 Noted

## 2024-12-09 ENCOUNTER — Ambulatory Visit (HOSPITAL_COMMUNITY)
Admission: RE | Admit: 2024-12-09 | Discharge: 2024-12-09 | Disposition: A | Discharge status: Home / Self Care | Source: Ambulatory Visit | Attending: Nurse Practitioner | Admitting: Nurse Practitioner

## 2024-12-09 ENCOUNTER — Encounter: Payer: Self-pay | Admitting: Nurse Practitioner

## 2024-12-09 ENCOUNTER — Ambulatory Visit: Admitting: Nurse Practitioner

## 2024-12-09 ENCOUNTER — Ambulatory Visit: Payer: Self-pay | Admitting: Nurse Practitioner

## 2024-12-09 ENCOUNTER — Other Ambulatory Visit (HOSPITAL_COMMUNITY)
Admission: RE | Admit: 2024-12-09 | Discharge: 2024-12-09 | Disposition: A | Source: Ambulatory Visit | Attending: Nurse Practitioner | Admitting: Nurse Practitioner

## 2024-12-09 VITALS — BP 120/66 | HR 67 | Temp 98.1°F | Ht 60.0 in | Wt 255.0 lb

## 2024-12-09 DIAGNOSIS — R5383 Other fatigue: Secondary | ICD-10-CM

## 2024-12-09 DIAGNOSIS — J4531 Mild persistent asthma with (acute) exacerbation: Secondary | ICD-10-CM

## 2024-12-09 DIAGNOSIS — R0602 Shortness of breath: Secondary | ICD-10-CM | POA: Diagnosis not present

## 2024-12-09 DIAGNOSIS — E785 Hyperlipidemia, unspecified: Secondary | ICD-10-CM | POA: Diagnosis not present

## 2024-12-09 DIAGNOSIS — D509 Iron deficiency anemia, unspecified: Secondary | ICD-10-CM

## 2024-12-09 DIAGNOSIS — R079 Chest pain, unspecified: Secondary | ICD-10-CM | POA: Diagnosis not present

## 2024-12-09 DIAGNOSIS — M797 Fibromyalgia: Secondary | ICD-10-CM

## 2024-12-09 DIAGNOSIS — I10 Essential (primary) hypertension: Secondary | ICD-10-CM

## 2024-12-09 LAB — CBC WITH DIFFERENTIAL/PLATELET
Abs Immature Granulocytes: 0.03 K/uL (ref 0.00–0.07)
Basophils Absolute: 0.1 K/uL (ref 0.0–0.1)
Basophils Relative: 1 %
Eosinophils Absolute: 0.3 K/uL (ref 0.0–0.5)
Eosinophils Relative: 4 %
HCT: 37.1 % (ref 36.0–46.0)
Hemoglobin: 11.9 g/dL — ABNORMAL LOW (ref 12.0–15.0)
Immature Granulocytes: 1 %
Lymphocytes Relative: 18 %
Lymphs Abs: 1.2 K/uL (ref 0.7–4.0)
MCH: 29.5 pg (ref 26.0–34.0)
MCHC: 32.1 g/dL (ref 30.0–36.0)
MCV: 92.1 fL (ref 80.0–100.0)
Monocytes Absolute: 0.3 K/uL (ref 0.1–1.0)
Monocytes Relative: 5 %
Neutro Abs: 4.7 K/uL (ref 1.7–7.7)
Neutrophils Relative %: 71 %
Platelets: 257 K/uL (ref 150–400)
RBC: 4.03 MIL/uL (ref 3.87–5.11)
RDW: 13.3 % (ref 11.5–15.5)
WBC: 6.6 K/uL (ref 4.0–10.5)
nRBC: 0 % (ref 0.0–0.2)

## 2024-12-09 LAB — COMPREHENSIVE METABOLIC PANEL WITH GFR
ALT: 18 U/L (ref 0–44)
AST: 29 U/L (ref 15–41)
Albumin: 4.4 g/dL (ref 3.5–5.0)
Alkaline Phosphatase: 113 U/L (ref 38–126)
Anion gap: 15 (ref 5–15)
BUN: 14 mg/dL (ref 6–20)
CO2: 23 mmol/L (ref 22–32)
Calcium: 9.6 mg/dL (ref 8.9–10.3)
Chloride: 103 mmol/L (ref 98–111)
Creatinine, Ser: 0.81 mg/dL (ref 0.44–1.00)
GFR, Estimated: >60 mL/min
Glucose, Bld: 117 mg/dL — ABNORMAL HIGH (ref 70–99)
Potassium: 3.7 mmol/L (ref 3.5–5.1)
Sodium: 141 mmol/L (ref 135–145)
Total Bilirubin: 0.4 mg/dL (ref 0.0–1.2)
Total Protein: 7.3 g/dL (ref 6.5–8.1)

## 2024-12-09 LAB — FERRITIN: Ferritin: 66 ng/mL (ref 11–307)

## 2024-12-09 LAB — IRON AND TIBC
Iron: 58 ug/dL (ref 28–170)
Saturation Ratios: 15 % (ref 10.4–31.8)
TIBC: 379 ug/dL (ref 250–450)
UIBC: 322 ug/dL

## 2024-12-09 LAB — T4, FREE: Free T4: 1.03 ng/dL (ref 0.80–2.00)

## 2024-12-09 LAB — LIPID PANEL
Cholesterol: 276 mg/dL — ABNORMAL HIGH (ref 0–200)
HDL: 64 mg/dL
LDL Cholesterol: 182 mg/dL — ABNORMAL HIGH (ref 0–99)
Total CHOL/HDL Ratio: 4.3 ratio
Triglycerides: 147 mg/dL
VLDL: 29 mg/dL (ref 0–40)

## 2024-12-09 LAB — TSH: TSH: 1.59 u[IU]/mL (ref 0.350–4.500)

## 2024-12-09 MED ORDER — PULMICORT FLEXHALER 90 MCG/ACT IN AEPB: INHALATION_SPRAY | RESPIRATORY_TRACT | 1 refills | Status: AC

## 2024-12-09 MED ORDER — IBUPROFEN 600 MG PO TABS: 600.0000 mg | ORAL_TABLET | Freq: Three times a day (TID) | ORAL | 0 refills | Status: DC | PRN

## 2024-12-09 MED ORDER — LISINOPRIL-HYDROCHLOROTHIAZIDE 20-12.5 MG PO TABS: 1.0000 | ORAL_TABLET | Freq: Every day | ORAL | 1 refills | Status: AC

## 2024-12-09 MED ORDER — AMITRIPTYLINE HCL 25 MG PO TABS: 25.0000 mg | ORAL_TABLET | Freq: Every day | ORAL | 1 refills | Status: AC

## 2024-12-09 NOTE — Progress Notes (Unsigned)
 "  Subjective:    Patient ID: Tammy Calderon, female    DOB: Jul 01, 1966, 58 y.o.   MRN: [id number]  HPI Discussed the use of AI scribe software for clinical note transcription with the patient, who gave verbal consent to proceed.  History of Present Illness Tammy Calderon is a 58 year old female with fibromyalgia who presents with increased fatigue and chest discomfort.  She has experienced increased fatigue beyond her usual level associated with fibromyalgia for over a month. This fatigue began before her father-in-law passed away in late Dec 04, 2024, adding stress to her life. She describes the fatigue as a persistent feeling of 'push, push, push.'  She reports a dull chest pain located in the middle part of her chest, accompanied by shortness of breath, which has worsened over the past month. She also experiences a lot of dry coughing, which sometimes necessitates the use of her inhaler, although it is not always effective. Occasional swelling in her legs is noted, particularly when on her feet for extended periods, but there are no significant changes in this symptom.  Episodes of rapid heart rate occur, particularly upon waking and at bedtime. She has a history of anemia, with slightly low iron levels noted in past blood work. She recalls being told that her hemoglobin tends to run just below normal. She recalls being told that low iron can cause palpitations.  She has a history of breast cancer in the left breast and is currently not undergoing any therapies. She stopped taking Lyrica  for fibromyalgia due to feeling lethargic and 'zombie-like,' despite it helping with pain. She has tried gabapentin  and Cymbalta  in the past without success. She previously stopped amitriptyline  for unclear reasons.  She reports sleep disturbances, waking up frequently, and experiencing leg pain when lying on one side for too long. She has been using ibuprofen  primarily at night to manage pain. She also experienced a  severe episode of sciatica about two to three weeks ago, which required the use of a cane and assistance from her daughter, but has since improved.  She has been off her cholesterol medication, pravastatin , after completing the initial prescription due to concerns about side effects, particularly constipation. Occasional diarrhea is managed with Imodium, and she experiences occasional acid reflux. She uses an inhaler for what was described as possible adult-onset asthma and has been using it more frequently lately.     Review of Systems     Objective:   Physical Exam        Assessment & Plan:   Assessment and Plan Assessment & Plan Fatigue and chest pain evaluation Fatigue and chest pain under evaluation for possible causes including stress, anemia, or thyroid  dysfunction. Cardiac issues not ruled out. Baseline anemia noted. - Ordered EKG. - Ordered CBC. - Ordered thyroid  function tests. - Ordered chest x-ray. - Ordered iron studies.  Fibromyalgia with chronic pain Chronic pain from fibromyalgia. Previous medications discontinued due to side effects. Current symptoms suggest possible hip alignment issues. - Restarted amitriptyline  25 mg at bedtime. - Advised use of a small pillow between legs. - Recommended over-the-counter lidocaine  patches.  Asthma Increased albuterol  use due to cold weather or colds. Symptoms include dry cough and shortness of breath. - Prescribed inhaled steroid for 90 days. - Advised albuterol  as needed, continue inhaled steroid if used more than 2-3 times per week.  Iron deficiency anemia Chronic mild anemia with stable hemoglobin levels. Symptoms include fatigue and pallor. - Ordered CBC. - Ordered iron studies.  Hypertension Blood pressure well-managed with lisinopril . - Refilled lisinopril  prescription.  Hyperlipidemia LDL previously high. Discussed cholesterol management to prevent cardiovascular and liver disease. Concerns about liver  health due to family history. - Discussed potential cardiac calcium  score test. - Ordered lipid panel.  History of breast cancer, left breast Under surveillance by oncology. - Continue follow-up with oncology.   "

## 2024-12-11 ENCOUNTER — Encounter: Payer: Self-pay | Admitting: Nurse Practitioner

## 2024-12-19 ENCOUNTER — Encounter: Payer: Self-pay | Admitting: *Deleted

## 2024-12-28 ENCOUNTER — Ambulatory Visit: Payer: Self-pay | Admitting: Nurse Practitioner

## 2025-01-05 ENCOUNTER — Other Ambulatory Visit: Payer: Self-pay | Admitting: Nurse Practitioner

## 2025-01-05 MED ORDER — ALBUTEROL SULFATE HFA 108 (90 BASE) MCG/ACT IN AERS: 2.0000 | INHALATION_SPRAY | Freq: Four times a day (QID) | RESPIRATORY_TRACT | 2 refills | Status: AC | PRN

## 2025-01-07 ENCOUNTER — Other Ambulatory Visit: Payer: Self-pay | Admitting: Nurse Practitioner

## 2025-01-07 MED ORDER — MELOXICAM 15 MG PO TABS: 15.0000 mg | ORAL_TABLET | Freq: Every day | ORAL | 0 refills | Status: AC

## 2025-02-20 ENCOUNTER — Other Ambulatory Visit (HOSPITAL_COMMUNITY)

## 2025-04-07 ENCOUNTER — Inpatient Hospital Stay

## 2025-04-14 ENCOUNTER — Inpatient Hospital Stay: Admitting: Oncology

## 2025-06-09 ENCOUNTER — Ambulatory Visit: Admitting: Nurse Practitioner

## 2025-10-10 ENCOUNTER — Other Ambulatory Visit (HOSPITAL_COMMUNITY)

## 2025-10-10 ENCOUNTER — Encounter (HOSPITAL_COMMUNITY)
